# Patient Record
Sex: Male | Born: 1955 | Race: White | Hispanic: No | Smoking: Never smoker
Health system: Southern US, Community
[De-identification: ages and names within clinical notes are randomized; demographics above are authoritative.]

## PROBLEM LIST (undated history)

## (undated) DIAGNOSIS — M1990 Unspecified osteoarthritis, unspecified site: Secondary | ICD-10-CM

## (undated) DIAGNOSIS — I219 Acute myocardial infarction, unspecified: Secondary | ICD-10-CM

## (undated) DIAGNOSIS — Z9289 Personal history of other medical treatment: Secondary | ICD-10-CM

## (undated) DIAGNOSIS — J189 Pneumonia, unspecified organism: Secondary | ICD-10-CM

## (undated) DIAGNOSIS — F1010 Alcohol abuse, uncomplicated: Secondary | ICD-10-CM

## (undated) DIAGNOSIS — I2510 Atherosclerotic heart disease of native coronary artery without angina pectoris: Secondary | ICD-10-CM

## (undated) DIAGNOSIS — I10 Essential (primary) hypertension: Secondary | ICD-10-CM

## (undated) HISTORY — PX: CARDIAC CATHETERIZATION: SHX172

## (undated) HISTORY — PX: HERNIA REPAIR: SHX51

## (undated) HISTORY — DX: Personal history of other medical treatment: Z92.89

## (undated) HISTORY — DX: Essential (primary) hypertension: I10

## (undated) HISTORY — DX: Atherosclerotic heart disease of native coronary artery without angina pectoris: I25.10

---

## 2013-06-29 ENCOUNTER — Emergency Department: Payer: Self-pay | Admitting: Emergency Medicine

## 2013-06-29 LAB — CBC WITH DIFFERENTIAL/PLATELET
Basophil #: 0.1 10*3/uL (ref 0.0–0.1)
Basophil %: 0.8 %
Eosinophil #: 0.1 10*3/uL (ref 0.0–0.7)
Eosinophil %: 1.3 %
HCT: 36.7 % — ABNORMAL LOW (ref 40.0–52.0)
HGB: 12.6 g/dL — ABNORMAL LOW (ref 13.0–18.0)
Lymphocyte #: 0.5 10*3/uL — ABNORMAL LOW (ref 1.0–3.6)
Lymphocyte %: 7.4 %
MCH: 33 pg (ref 26.0–34.0)
MCHC: 34.2 g/dL (ref 32.0–36.0)
MCV: 96 fL (ref 80–100)
MONO ABS: 0.6 x10 3/mm (ref 0.2–1.0)
MONOS PCT: 7.6 %
Neutrophil #: 6 10*3/uL (ref 1.4–6.5)
Neutrophil %: 82.9 %
Platelet: 238 10*3/uL (ref 150–440)
RBC: 3.81 10*6/uL — ABNORMAL LOW (ref 4.40–5.90)
RDW: 13.2 % (ref 11.5–14.5)
WBC: 7.3 10*3/uL (ref 3.8–10.6)

## 2013-06-29 LAB — BASIC METABOLIC PANEL
Anion Gap: 11 (ref 7–16)
BUN: 10 mg/dL (ref 7–18)
CHLORIDE: 104 mmol/L (ref 98–107)
CO2: 23 mmol/L (ref 21–32)
Calcium, Total: 9 mg/dL (ref 8.5–10.1)
Creatinine: 0.76 mg/dL (ref 0.60–1.30)
EGFR (African American): >60
Glucose: 81 mg/dL (ref 65–99)
OSMOLALITY: 274 (ref 275–301)
Potassium: 3.4 mmol/L — ABNORMAL LOW (ref 3.5–5.1)
Sodium: 138 mmol/L (ref 136–145)

## 2013-06-29 LAB — TROPONIN I

## 2014-03-31 ENCOUNTER — Emergency Department: Payer: Self-pay | Admitting: Emergency Medicine

## 2014-05-11 NOTE — Consult Note (Signed)
PATIENT NAME:  Jason Wiggins, Jason Wiggins MR#:  [id number] DATE OF BIRTH:  September 28, 1955  DATE OF CONSULTATION:  04/01/2014  REFERRING PHYSICIAN:   CONSULTING PHYSICIAN:  Audery Amel, MD   IDENTIFYING INFORMATION AND REASON FOR CONSULTATION: A 59 year old man who is brought in by his sister with concern that he had made suicidal statements yesterday.   CHIEF COMPLAINT: "I had a bad day."   HISTORY OF PRESENT ILLNESS: Information from the patient and the chart. The patient's paperwork states that he had made suicidal threats talking about shooting himself with a gun and that the sister was concerned because there was a gun available in the house. The patient says that he was upset yesterday and that what he recalls is that he made a statement about how he would be better off if he were not alive anymore, but he denies that he had actually threatened to kill himself. Yesterday, he had a major stress because his wife just got out of prison after 6 years. He has not been in any contact with her for 18 months. When she got out, she refused to speak to him on the phone. He overreacted to this and made statements about being better off dead. He admits that he had been drinking at the time. Denied that he had been abusing any drugs. He tells me that prior to yesterday his mood had been fine. Was not having ongoing depression. Slept and ate normally. Had not been having any suicidal thoughts. The patient drinks about three 24 ounces size beers every day which has been a chronic pattern. Not using any other drugs. Not getting any psychiatric treatment.   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 He has never seen a psychiatrist for any other kind of treatment. Denies any history of psychiatric hospitalization or medicine. Denies any past history of suicide attempts.   SUBSTANCE ABUSE HISTORY: History of heroin abuse which ended 6 years ago when he was locked up  for drug-related charges. He says he got out about 3 years ago and did not go back to using narcotics after that. Continues to drink. Says drinking has been a problem for him in the past. No history of seizures or DTs.   SOCIAL HISTORY: The patient resides with his sister. He has 2 sisters living here in [place]. 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 He is married and his wife has been incarcerated for 6 years as well and just got out yesterday, but is declining to speak to him.   PAST MEDICAL HISTORY: Multiple orthopedic injuries over the years resulting in chronic arthritis. Has arthritis with pain in his hands, his shoulder, and knees. Does not get any kind of treatment for it. No other known medical problems.   FAMILY HISTORY: No known history of mental illness.   CURRENT MEDICATIONS: None.   ALLERGIES: No known drug allergies.   REVIEW OF SYSTEMS: The patient denies depression. Denies suicidal ideation. Denies hallucinations. Mild arthritis right is mostly in the hands. No other physical complaints.   MENTAL STATUS EXAMINATION: Adequately groomed gentleman, looks his stated age, cooperative with the interview. Eye contact good. Psychomotor activity normal. Speech quiet, but normal tone. Affect is slightly flattened, but not extreme. Does not appear hopeless at all.  Able to smile. Mood is stated as all right. Thoughts are lucid without loosening of associations. No evidence of delusions. Denies auditory or visual hallucinations. Denies  suicidal or homicidal ideation. He is alert and oriented x 4. Remembers 3/3 objects immediately, 2/3 at 3 minutes. Judgment and insight appears to be good.  Baseline intelligence appears to be normal.   LABORATORY RESULTS: Alcohol level 222 on admission. Drug screen was all negative. Chemistry panel was all negative. CBC: Slightly low hematocrit at 39.6. Urinalysis unremarkable.   VITAL SIGNS: Blood pressure 139/72, respirations 18, pulse 91,  temperature 97.2.   ASSESSMENT: This is a 59 year old man who had a very bad piece of news yesterday. Had also been drinking at the time. Made suicidal statements, but evidently did not act on it. Today he is denying depression or depressive symptoms. Has no psychotic symptoms. Totally denies suicidal ideation. Has good insight. The patient's diagnosis will be made, adjustment disorder with disturbance of mood, also alcohol abuse mild to moderate. The patient does not appear to meet commitment criteria.   TREATMENT PLAN: Discontinue involuntary commitment. Case discussed with Dr. Carollee MassedKaminski. Recommend he be discharged from the Emergency Room. Psychoeducation completed about the dangers of drinking especially along with mood problems. The patient expresses an understanding of this. He is encouraged to follow up with [facility] for counseling services. He can also be given information about the [facility] for further medical care.   DIAGNOSIS, PRINCIPAL AND PRIMARY: AXIS I: Adjustment disorder with depressed mood.   SECONDARY DIAGNOSES:  AXIS I: Alcohol abuse, moderate.  AXIS II: Deferred.  AXIS III: Arthritis.   ____________________________ Audery AmelJohn T. Clapacs, MD jtc:sp D: 04/01/2014 11:13:45 ET T: 04/01/2014 11:44:03 ET JOB#: [id number]  cc: Audery AmelJohn T. Clapacs, MD, <Dictator> Audery AmelJOHN T CLAPACS MD ELECTRONICALLY SIGNED 04/14/2014 9:57

## 2014-08-29 ENCOUNTER — Emergency Department
Admission: EM | Admit: 2014-08-29 | Discharge: 2014-08-29 | Disposition: A | Discharge status: Home / Self Care | Payer: Self-pay | Attending: Emergency Medicine | Admitting: Emergency Medicine

## 2014-08-29 ENCOUNTER — Encounter: Payer: Self-pay | Admitting: Emergency Medicine

## 2014-08-29 ENCOUNTER — Emergency Department: Payer: Self-pay

## 2014-08-29 DIAGNOSIS — M25512 Pain in left shoulder: Secondary | ICD-10-CM | POA: Insufficient documentation

## 2014-08-29 MED ORDER — NAPROXEN 500 MG PO TABS: 500.0000 mg | ORAL_TABLET | Freq: Two times a day (BID) | ORAL | Status: DC

## 2014-08-29 NOTE — ED Notes (Signed)
Pt presents with left shoulder pain started yesterday. Denies any shortness of breath and or chest pain.

## 2014-08-29 NOTE — ED Notes (Signed)
Patient ambulated with easy and steady gait to X-Ray.  NAD.  Upright and relaxed posture.

## 2014-08-29 NOTE — ED Provider Notes (Signed)
[facility] Emergency Department Provider Note  ____________________________________________  Time seen: Approximately 11:14 AM  I have reviewed the triage vital signs and the nursing notes.   HISTORY  Chief Complaint Shoulder Pain   HPI Jason Que. is a 59 y.o. male is here complaining of left shoulder pain that started yesterday. He states he was carrying some things yesterday and his shoulder began hurting pain is increased today. He denies any direct trauma to his shoulder yesterday. He states that he did have an injury 10 years ago but never had it x-rayed. He has not taken any over-the-counter medication for his shoulder today. He denies any head injury or loss of consciousness during this accident.currently he rates his pain a 10 out of 10.he denies any previous history of hypertension. He states that one of his siblings is hypertensive. Currently he denies any shortness of breath or chest pain. He denies any cardiac disease.He states he has wife just moved to [place].   History reviewed. No pertinent past medical history.  There are no active problems to display for this patient.   History reviewed. No pertinent past surgical history.  Current Outpatient Rx  Name  Route  Sig  Dispense  Refill  . naproxen (NAPROSYN) 500 MG tablet   Oral   Take 1 tablet (500 mg total) by mouth 2 (two) times daily with a meal.   30 tablet   0     Allergies Review of patient's allergies indicates no known allergies.  No family history on file.  Social History Social History  Substance Use Topics  . Smoking status: Never Smoker   . Smokeless tobacco: None  . Alcohol Use: Yes    Review of Systems Constitutional: No fever/chills Eyes: No visual changes. ENT: No sore throat. Cardiovascular: Denies chest pain. Respiratory: Denies shortness of breath. Gastrointestinal: No abdominal pain.  No nausea, no vomiting. Musculoskeletal: Negative for back  pain. Skin: Negative for rash. Neurological: Negative for headaches, focal weakness or numbness.  10-point ROS otherwise negative.  ____________________________________________   PHYSICAL EXAM:  VITAL SIGNS: ED Triage Vitals  Enc Vitals Group     BP 08/29/14 1058 144/101 mmHg     Pulse Rate 08/29/14 1058 75     Resp 08/29/14 1058 18     Temp 08/29/14 1058 98.3 F (36.8 C)     Temp Source 08/29/14 1058 Oral     SpO2 08/29/14 1058 98 %     Weight 08/29/14 1058 280 lb (127.007 kg)     Height 08/29/14 1058 5\' 11"  (1.803 m)     Head Cir --      Peak Flow --      Pain Score 08/29/14 1059 10     Pain Loc --      Pain Edu? --      Excl. in GC? --     Constitutional: Alert and oriented. Well appearing and in no acute distress. Eyes: Conjunctivae are normal. PERRL. EOMI. Head: Atraumatic. Nose: No congestion/rhinnorhea. Neck: No stridor.  No cervical tenderness on palpation posteriorly Cardiovascular: Normal rate, regular rhythm. Grossly normal heart sounds.  Good peripheral circulation. Respiratory: Normal respiratory effort.  No retractions. Lungs CTAB. Gastrointestinal: Soft and nontender. No distention.  Musculoskeletal:left shoulder no gross deformity. There is minimal tenderness on palpation of the clavicle. Range of motion is restricted secondary to pain. Patient is able to abduct with limitation and posterior movement of his shoulder increases pain. No lower extremity tenderness nor edema.  No joint effusions. Neurologic:  Normal speech and language. No gross focal neurologic deficits are appreciated. No gait instability. Skin:  Skin is warm, dry and intact. No rash noted.there is no ecchymosis or abrasion noted Psychiatric: Mood and affect are normal. Speech and behavior are normal.  ____________________________________________   LABS (all labs ordered are listed, but only abnormal results are displayed)  Labs Reviewed - No data to display RADIOLOGY  Left shoulder  x-ray per radiologist shows degenerative changes but no acute bony abnormality. I, Tommi Rumps, personally viewed and evaluated these images (plain radiographs) as part of my medical decision making.  ____________________________________________   PROCEDURES  Procedure(s) performed: None  Critical Care performed: No  ____________________________________________   INITIAL IMPRESSION / ASSESSMENT AND PLAN / ED COURSE  Pertinent labs & imaging results that were available during my care of the patient were reviewed by me and considered in my medical decision making (see chart for details).  Patient was given a prescription for naproxen 500 mg 1 twice a day with food. He is to follow-up with [facility] if any continued problems.he is also encouraged to follow-up with [facility] health Department for hepatitis testing ____________________________________________   FINAL CLINICAL IMPRESSION(S) / ED DIAGNOSES  Final diagnoses:  Shoulder pain, acute, left      Tommi Rumps, PA-C 08/29/14 1324  Sharyn Creamer, MD 08/29/14 1616

## 2014-08-29 NOTE — ED Notes (Signed)
Patient states that wife just came to [facility] and is positive for hepatitis B and C and states he would like to be tested for hepatitis.

## 2014-08-29 NOTE — Discharge Instructions (Signed)
Arthritis, Nonspecific °Arthritis is inflammation of a joint. This usually means pain, redness, warmth or swelling are present. One or more joints may be involved. There are a number of types of arthritis. Your caregiver may not be able to tell what type of arthritis you have right away. °CAUSES  °The most common cause of arthritis is the wear and tear on the joint (osteoarthritis). This causes damage to the cartilage, which can break down over time. The knees, hips, back and neck are most often affected by this type of arthritis. °Other types of arthritis and common causes of joint pain include: °· Sprains and other injuries near the joint. Sometimes minor sprains and injuries cause pain and swelling that develop hours later. °· Rheumatoid arthritis. This affects hands, feet and knees. It usually affects both sides of your body at the same time. It is often associated with chronic ailments, fever, weight loss and general weakness. °· Crystal arthritis. Gout and pseudo gout can cause occasional acute severe pain, redness and swelling in the foot, ankle, or knee. °· Infectious arthritis. Bacteria can get into a joint through a break in overlying skin. This can cause infection of the joint. Bacteria and viruses can also spread through the blood and affect your joints. °· Drug, infectious and allergy reactions. Sometimes joints can become mildly painful and slightly swollen with these types of illnesses. °SYMPTOMS  °· Pain is the main symptom. °· Your joint or joints can also be red, swollen and warm or hot to the touch. °· You may have a fever with certain types of arthritis, or even feel overall ill. °· The joint with arthritis will hurt with movement. Stiffness is present with some types of arthritis. °DIAGNOSIS  °Your caregiver will suspect arthritis based on your description of your symptoms and on your exam. Testing may be needed to find the type of arthritis: °· Blood and sometimes urine tests. °· X-ray tests  and sometimes CT or MRI scans. °· Removal of fluid from the joint (arthrocentesis) is done to check for bacteria, crystals or other causes. Your caregiver (or a specialist) will numb the area over the joint with a local anesthetic, and use a needle to remove joint fluid for examination. This procedure is only minimally uncomfortable. °· Even with these tests, your caregiver may not be able to tell what kind of arthritis you have. Consultation with a specialist (rheumatologist) may be helpful. °TREATMENT  °Your caregiver will discuss with you treatment specific to your type of arthritis. If the specific type cannot be determined, then the following general recommendations may apply. °Treatment of severe joint pain includes: °· Rest. °· Elevation. °· Anti-inflammatory medication (for example, ibuprofen) may be prescribed. Avoiding activities that cause increased pain. °· Only take over-the-counter or prescription medicines for pain and discomfort as recommended by your caregiver. °· Cold packs over an inflamed joint may be used for 10 to 15 minutes every hour. Hot packs sometimes feel better, but do not use overnight. Do not use hot packs if you are diabetic without your caregiver's permission. °· A cortisone shot into arthritic joints may help reduce pain and swelling. °· Any acute arthritis that gets worse over the next 1 to 2 days needs to be looked at to be sure there is no joint infection. °Long-term arthritis treatment involves modifying activities and lifestyle to reduce joint stress jarring. This can include weight loss. Also, exercise is needed to nourish the joint cartilage and remove waste. This helps keep the muscles   around the joint strong. °HOME CARE INSTRUCTIONS  °· Do not take aspirin to relieve pain if gout is suspected. This elevates uric acid levels. °· Only take over-the-counter or prescription medicines for pain, discomfort or fever as directed by your caregiver. °· Rest the joint as much as  possible. °· If your joint is swollen, keep it elevated. °· Use crutches if the painful joint is in your leg. °· Drinking plenty of fluids may help for certain types of arthritis. °· Follow your caregiver's dietary instructions. °· Try low-impact exercise such as: °¨ Swimming. °¨ Water aerobics. °¨ Biking. °¨ Walking. °· Morning stiffness is often relieved by a warm shower. °· Put your joints through regular range-of-motion. °SEEK MEDICAL CARE IF:  °· You do not feel better in 24 hours or are getting worse. °· You have side effects to medications, or are not getting better with treatment. °SEEK IMMEDIATE MEDICAL CARE IF:  °· You have a fever. °· You develop severe joint pain, swelling or redness. °· Many joints are involved and become painful and swollen. °· There is severe back pain and/or leg weakness. °· You have loss of bowel or bladder control. °Document Released: 02/04/2004 Document Revised: 03/21/2011 Document Reviewed: 02/20/2008 °Exit[employer]® Patient Information ©2015 [employer]. This information is not intended to replace advice given to you by your health care provider. Make sure you discuss any questions you have with your health care provider. ° °

## 2016-07-20 ENCOUNTER — Emergency Department
Admission: EM | Admit: 2016-07-20 | Discharge: 2016-07-20 | Disposition: A | Discharge status: Home / Self Care | Payer: Self-pay | Attending: Student in an Organized Health Care Education/Training Program | Admitting: Student in an Organized Health Care Education/Training Program

## 2016-07-20 ENCOUNTER — Emergency Department: Payer: Self-pay

## 2016-07-20 ENCOUNTER — Encounter: Payer: Self-pay | Admitting: Emergency Medicine

## 2016-07-20 DIAGNOSIS — X500XXA Overexertion from strenuous movement or load, initial encounter: Secondary | ICD-10-CM | POA: Insufficient documentation

## 2016-07-20 DIAGNOSIS — S39012A Strain of muscle, fascia and tendon of lower back, initial encounter: Secondary | ICD-10-CM | POA: Insufficient documentation

## 2016-07-20 DIAGNOSIS — M545 Low back pain, unspecified: Secondary | ICD-10-CM

## 2016-07-20 DIAGNOSIS — Y929 Unspecified place or not applicable: Secondary | ICD-10-CM | POA: Insufficient documentation

## 2016-07-20 DIAGNOSIS — Y999 Unspecified external cause status: Secondary | ICD-10-CM | POA: Insufficient documentation

## 2016-07-20 DIAGNOSIS — Y93H9 Activity, other involving exterior property and land maintenance, building and construction: Secondary | ICD-10-CM | POA: Insufficient documentation

## 2016-07-20 HISTORY — DX: Unspecified osteoarthritis, unspecified site: M19.90

## 2016-07-20 LAB — URINALYSIS, COMPLETE (UACMP) WITH MICROSCOPIC
BACTERIA UA: NONE SEEN
Bilirubin Urine: NEGATIVE
Glucose, UA: NEGATIVE mg/dL
Hgb urine dipstick: NEGATIVE
Ketones, ur: NEGATIVE mg/dL
Leukocytes, UA: NEGATIVE
Nitrite: NEGATIVE
PH: 5 (ref 5.0–8.0)
Protein, ur: NEGATIVE mg/dL
RBC / HPF: NONE SEEN RBC/hpf (ref 0–5)
SPECIFIC GRAVITY, URINE: 1.019 (ref 1.005–1.030)

## 2016-07-20 MED ORDER — NAPROXEN 500 MG PO TABS: 500.0000 mg | ORAL_TABLET | Freq: Two times a day (BID) | ORAL | 0 refills | Status: DC

## 2016-07-20 MED ORDER — OXYCODONE-ACETAMINOPHEN 5-325 MG PO TABS
1.0000 | ORAL_TABLET | Freq: Once | ORAL | Status: AC
  Administered 2016-07-20: 1 via ORAL
  Filled 2016-07-20: qty 1

## 2016-07-20 MED ORDER — DIAZEPAM 2 MG PO TABS: 2.0000 mg | ORAL_TABLET | Freq: Three times a day (TID) | ORAL | 0 refills | Status: DC | PRN

## 2016-07-20 MED ORDER — HYDROCODONE-ACETAMINOPHEN 5-325 MG PO TABS: 1.0000 | ORAL_TABLET | ORAL | 0 refills | Status: DC | PRN

## 2016-07-20 MED ORDER — KETOROLAC TROMETHAMINE 30 MG/ML IJ SOLN
30.0000 mg | Freq: Once | INTRAMUSCULAR | Status: AC
  Administered 2016-07-20: 30 mg via INTRAMUSCULAR
  Filled 2016-07-20: qty 1

## 2016-07-20 MED ORDER — DIAZEPAM 5 MG PO TABS
5.0000 mg | ORAL_TABLET | Freq: Once | ORAL | Status: AC
  Administered 2016-07-20: 5 mg via ORAL
  Filled 2016-07-20: qty 1

## 2016-07-20 NOTE — Discharge Instructions (Signed)
Follow-up with your primary care doctor or [facility] if any continued problems. Use ice or heat to back. Take medication only as directed. Diazepam as needed for muscle spasm, and naproxen 500 mg twice a day every day with food and Norco every 4 hours if needed for moderate pain.

## 2016-07-20 NOTE — ED Provider Notes (Signed)
[facility] Emergency Department Provider Note  ____________________________________________   First MD Initiated Contact with Patient 07/20/16 1039     (approximate)  I have reviewed the triage vital signs and the nursing notes.   HISTORY  Chief Complaint Back Pain    HPI Jason CoveFoster Alfonzo Jr. is a 61 y.o. male is here complaining of left lower back pain. Patient states that pain began while trying to take a tree down 2 days ago. He also had back pain 2 weeks ago when he believes he was passing a kidney stone.Patient denies any nausea or vomiting. There is been no fever or chills. He denies any hematuria at this time. He is taken over-the-counter medication without any relief of his back pain. He denies any paresthesias, incontinence of bowel or bladder, or saddle anesthesias. Patient continues to ambulate slowly with pain. Currently he rates his pain as a 10 over 10.   Past Medical History:  Diagnosis Date  . Arthritis     There are no active problems to display for this patient.   Past Surgical History:  Procedure Laterality Date  . HERNIA REPAIR      Prior to Admission medications   Medication Sig Start Date End Date Taking? Authorizing Provider  diazepam (VALIUM) 2 MG tablet Take 1 tablet (2 mg total) by mouth every 8 (eight) hours as needed for muscle spasms. 07/20/16   Tommi RumpsSummers, Tyffany Waldrop L, PA-C  HYDROcodone-acetaminophen (NORCO/VICODIN) 5-325 MG tablet Take 1 tablet by mouth every 4 (four) hours as needed for moderate pain. 07/20/16   Tommi RumpsSummers, Koby Pickup L, PA-C  naproxen (NAPROSYN) 500 MG tablet Take 1 tablet (500 mg total) by mouth 2 (two) times daily with a meal. 07/20/16   Tommi RumpsSummers, Idalia Allbritton L, PA-C    Allergies Patient has no known allergies.  No family history on file.  Social History Social History  Substance Use Topics  . Smoking status: Never Smoker  . Smokeless tobacco: Never Used  . Alcohol use Yes     Comment: daily    Review of  Systems Constitutional: No fever/chills Cardiovascular: Denies chest pain. Respiratory: Denies shortness of breath. Gastrointestinal: No abdominal pain.  No nausea, no vomiting.   Genitourinary: Negative for dysuria. Musculoskeletal: Positive for left-sided back pain. Skin: Negative for rash. Neurological: Negative for headaches, focal weakness or numbness.   ____________________________________________   PHYSICAL EXAM:  VITAL SIGNS: ED Triage Vitals  Enc Vitals Group     BP 07/20/16 0953 (!) 146/81     Pulse Rate 07/20/16 0953 77     Resp 07/20/16 0953 18     Temp 07/20/16 0953 98.3 F (36.8 C)     Temp Source 07/20/16 0953 Oral     SpO2 07/20/16 0953 95 %     Weight 07/20/16 0945 240 lb (108.9 kg)     Height 07/20/16 0945 5\' 9"  (1.753 m)     Head Circumference --      Peak Flow --      Pain Score 07/20/16 0944 10     Pain Loc --      Pain Edu? --      Excl. in GC? --    Constitutional: Alert and oriented. Well appearing and in no acute distress. Eyes: Conjunctivae are normal. PERRL. EOMI. Neck: No stridor.   Cardiovascular: Normal rate, regular rhythm. Grossly normal heart sounds.  Good peripheral circulation. Respiratory: Normal respiratory effort.  No retractions. Lungs CTAB. Gastrointestinal: Soft and nontender. No distention.  No CVA tenderness.  Musculoskeletal: On examination of the lower back there is no gross deformity noted. There is moderate tenderness on palpation of the L5-S1 area and paravertebral muscles bilaterally. Range of motion is slow and guarded secondary to pain. Straight leg raises are negative. Slow guarded gait was noted. Neurologic:  Normal speech and language. No gross focal neurologic deficits are appreciated. Reflexes 1+ bilaterally. Skin:  Skin is warm, dry and intact. No rash noted. Psychiatric: Mood and affect are normal. Speech and behavior are normal.  ____________________________________________   LABS (all labs ordered are listed,  but only abnormal results are displayed)  Labs Reviewed  URINALYSIS, COMPLETE (UACMP) WITH MICROSCOPIC - Abnormal; Notable for the following:       Result Value   Color, Urine YELLOW (*)    APPearance CLEAR (*)    Squamous Epithelial / LPF 0-5 (*)    All other components within normal limits   ____________________________________________  RADIOLOGY  Dg Lumbar Spine 2-3 Views  Result Date: 07/20/2016 CLINICAL DATA:  Back pain.  Injury cutting down tree EXAM: LUMBAR SPINE - 2-3 VIEW COMPARISON:  None. FINDINGS: Mild levoscoliosis. Normal alignment. Mild disc degeneration and spurring throughout the lumbar spine. Negative for fracture. Mild facet degeneration L5-S1. IMPRESSION: Lumbar degenerative changes.  Negative for fracture. Electronically Signed   By: Marlan Palau M.D.   On: 07/20/2016 12:14    ____________________________________________   PROCEDURES  Procedure(s) performed: None  Procedures  Critical Care performed: No  ____________________________________________   INITIAL IMPRESSION / ASSESSMENT AND PLAN / ED COURSE  Pertinent labs & imaging results that were available during my care of the patient were reviewed by me and considered in my medical decision making (see chart for details).  Patient was given Toradol 30 mg IM, Percocet 5 mg by mouth and diazepam 5 mg by mouth. Patient did have improvement prior to his discharge. We discussed the findings on his x-ray with degenerative changes. He was discharged with prescription for naproxen 500 mg twice a day with food, diazepam 2 mg 1 tablet every 8 hours as needed for muscle spasms for the next 3 days and Norco every hours if needed for pain. He is to follow-up with his PCP or [facility] if any continued problems.      ____________________________________________   FINAL CLINICAL IMPRESSION(S) / ED DIAGNOSES  Final diagnoses:  Strain of lumbar region, initial encounter  Acute left-sided low back pain  without sciatica      NEW MEDICATIONS STARTED DURING THIS VISIT:  New Prescriptions   DIAZEPAM (VALIUM) 2 MG TABLET    Take 1 tablet (2 mg total) by mouth every 8 (eight) hours as needed for muscle spasms.   HYDROCODONE-ACETAMINOPHEN (NORCO/VICODIN) 5-325 MG TABLET    Take 1 tablet by mouth every 4 (four) hours as needed for moderate pain.   NAPROXEN (NAPROSYN) 500 MG TABLET    Take 1 tablet (500 mg total) by mouth 2 (two) times daily with a meal.     Note:  This document was prepared using Dragon voice recognition software and may include unintentional dictation errors.    Tommi Rumps, PA-C 07/20/16 1313    Willy Eddy, MD 07/20/16 1345

## 2016-07-20 NOTE — ED Triage Notes (Signed)
Patient presents to the ED with lower back pain x 2 days after twisting when attempting to cut down a tree.  Patient ambulatory to triage.

## 2016-07-20 NOTE — ED Notes (Signed)
Pt using chainsaw X 2 days ago, pulled it hard and developed left lower back pain.

## 2016-07-30 ENCOUNTER — Encounter: Payer: Self-pay | Admitting: Emergency Medicine

## 2016-07-30 ENCOUNTER — Emergency Department
Admission: EM | Admit: 2016-07-30 | Discharge: 2016-07-30 | Disposition: A | Discharge status: Home / Self Care | Payer: Self-pay | Attending: Emergency Medicine | Admitting: Emergency Medicine

## 2016-07-30 DIAGNOSIS — W298XXA Contact with other powered powered hand tools and household machinery, initial encounter: Secondary | ICD-10-CM | POA: Insufficient documentation

## 2016-07-30 DIAGNOSIS — Y929 Unspecified place or not applicable: Secondary | ICD-10-CM | POA: Insufficient documentation

## 2016-07-30 DIAGNOSIS — Y999 Unspecified external cause status: Secondary | ICD-10-CM | POA: Insufficient documentation

## 2016-07-30 DIAGNOSIS — S3121XA Laceration without foreign body of penis, initial encounter: Secondary | ICD-10-CM | POA: Insufficient documentation

## 2016-07-30 DIAGNOSIS — Y9389 Activity, other specified: Secondary | ICD-10-CM | POA: Insufficient documentation

## 2016-07-30 LAB — URINALYSIS, COMPLETE (UACMP) WITH MICROSCOPIC
BACTERIA UA: NONE SEEN
Bilirubin Urine: NEGATIVE
GLUCOSE, UA: NEGATIVE mg/dL
HGB URINE DIPSTICK: NEGATIVE
Ketones, ur: 5 mg/dL — AB
LEUKOCYTES UA: NEGATIVE
NITRITE: NEGATIVE
PROTEIN: NEGATIVE mg/dL
Specific Gravity, Urine: 1.026 (ref 1.005–1.030)
Squamous Epithelial / LPF: NONE SEEN
pH: 5 (ref 5.0–8.0)

## 2016-07-30 MED ORDER — LIDOCAINE HCL (PF) 1 % IJ SOLN
5.0000 mL | Freq: Once | INTRAMUSCULAR | Status: AC
  Administered 2016-07-30: 5 mL
  Filled 2016-07-30: qty 5

## 2016-07-30 MED ORDER — LIDOCAINE-EPINEPHRINE-TETRACAINE (LET) SOLUTION
3.0000 mL | Freq: Once | NASAL | Status: AC
  Administered 2016-07-30: 20:00:00 3 mL via TOPICAL
  Filled 2016-07-30: qty 3

## 2016-07-30 MED ORDER — OXYCODONE-ACETAMINOPHEN 5-325 MG PO TABS
1.0000 | ORAL_TABLET | Freq: Once | ORAL | Status: AC
Start: 2016-07-30 — End: 2016-07-30
  Administered 2016-07-30: 1 via ORAL
  Filled 2016-07-30: qty 1

## 2016-07-30 NOTE — ED Provider Notes (Signed)
[facility] Department [facility]   ____________________________________________   I have reviewed the triage vital signs and the nursing notes.   HISTORY  Chief Complaint Laceration    HPI Jason CoveFoster Mapps Jr. is a 61 y.o. male presents to the emergency department with a laceration along the base of the penis and scrotum he sustained earlier tonight. Patient reports while grinding a piece of metal, the person assisting him losing grip on the metal. The metal grinder kicked back and the grinder caught his jeans tearing into the jeans and his underwear then sustaining a laceration. Patient reported having TDAP vaccination 3 years ago. Patient easily maintained hemorrhage control. Patient denies fever, chills, headache, vision changes, chest pain, chest tightness, shortness of breath, abdominal pain, nausea and vomiting.  Past Medical History:  Diagnosis Date  . Arthritis     There are no active problems to display for this patient.   Past Surgical History:  Procedure Laterality Date  . HERNIA REPAIR      Prior to Admission medications   Medication Sig Start Date End Date Taking? Authorizing Provider  diazepam (VALIUM) 2 MG tablet Take 1 tablet (2 mg total) by mouth every 8 (eight) hours as needed for muscle spasms. 07/20/16   Tommi RumpsSummers, Rhonda L, PA-C  HYDROcodone-acetaminophen (NORCO/VICODIN) 5-325 MG tablet Take 1 tablet by mouth every 4 (four) hours as needed for moderate pain. 07/20/16   Tommi RumpsSummers, Rhonda L, PA-C  naproxen (NAPROSYN) 500 MG tablet Take 1 tablet (500 mg total) by mouth 2 (two) times daily with a meal. 07/20/16   Tommi RumpsSummers, Rhonda L, PA-C    Allergies Patient has no known allergies.  No family history on file.  Social History Social History  Substance Use Topics  . Smoking status: Never Smoker  . Smokeless tobacco: Never Used  . Alcohol use Yes     Comment: daily    Review of Systems Constitutional: Negative for  fever/chills Eyes: No visual changes. Cardiovascular: Denies chest pain. Respiratory: Denies cough. Denies shortness of breath. Gastrointestinal: No abdominal pain.  No nausea, vomiting, diarrhea. Genitourinary: Pain at the base of the penis and right scrotum secondary to laceration Musculoskeletal: Negative for back pain. Skin: Negative for rash. 4 cm laceration at the base of the penis full-thickness skin. 1 cm laceration along the right scrotum. Neurological: Negative for headaches.  Negative focal weakness or numbness. Negative for loss of consciousness. Able to ambulate. ____________________________________________   PHYSICAL EXAM:  VITAL SIGNS: ED Triage Vitals [07/30/16 1745]  Enc Vitals Group     BP (!) 146/98     Pulse Rate 83     Resp 20     Temp 97.7 F (36.5 C)     Temp Source Oral     SpO2 94 %     Weight 240 lb (108.9 kg)     Height 5\' 9"  (1.753 m)     Head Circumference      Peak Flow      Pain Score 10     Pain Loc      Pain Edu?      Excl. in GC?     Constitutional: Alert and oriented. Well appearing and in no acute distress.  Head: Normocephalic and atraumatic. Eyes: Conjunctivae are normal. PERRL. Cardiovascular: Normal rate, regular rhythm. Normal distal pulses. Respiratory: Normal respiratory effort.  Gastrointestinal: Soft and nontender. No distention. Genitourinary: Pain at the base of the penis and right scrotum secondary to laceration. Laceration through the epidermis  and dermal layers without involvement of the deeper soft tissue or neurovascular structures. Musculoskeletal: Nontender with normal range of motion in all extremities. Neurologic: Normal speech and language. Skin:  Skin is warm, dry and intact. No rash noted. 4.0 cm laceration along the base of the penis, full skin thickness, through epidermis and dermis. 1 cm laceration to the right scrotal sac. Psychiatric: Mood and affect are normal.  ____________________________________________    LABS (all labs ordered are listed, but only abnormal results are displayed)  Labs Reviewed  URINALYSIS, COMPLETE (UACMP) WITH MICROSCOPIC - Abnormal; Notable for the following:       Result Value   Color, Urine YELLOW (*)    APPearance CLEAR (*)    Ketones, ur 5 (*)    All other components within normal limits   ____________________________________________  EKG None ____________________________________________  RADIOLOGY None ____________________________________________   PROCEDURES  Procedure(s) performed: LACERATION REPAIR Performed by: Clois Comber Authorized by: Clois Comber Consent: Verbal consent obtained. Risks and benefits: risks, benefits and alternatives were discussed Consent given by: patient Patient identity confirmed: provided demographic data Prepped and Draped in normal sterile fashion Wound explored  Laceration Location: Base of the penis and right scrotal sac  Laceration Length: 4.0 cm and 1.0 cm  No Foreign Bodies seen or palpated  Anesthesia: local infiltration  Local anesthetic: lidocaine 1.0 %  Anesthetic total: 5.0 ml  Irrigation method: syringe Amount of cleaning: standard  Skin closure: Ethilon 5-0  Number of sutures: 6 sutures at base of penis and 2 sutures right scrotal sac  Technique: Simple interrupted   Patient tolerance: Patient tolerated the procedure well with no immediate complications.    Critical Care performed: no ____________________________________________   INITIAL IMPRESSION / ASSESSMENT AND PLAN / ED COURSE  Pertinent labs & imaging results that were available during my care of the patient were reviewed by me and considered in my medical decision making (see chart for details).  Patient sustained a laceration along the base of the penis in the right scrotal sac  Assessment confirmed intact sensation and neurovascular sure before and after wound closure. Laceration required suture closure as noted  above. Patient tolerated procedure well. Pt instructed to keep wound clean and dry. Patient reported having a TDAP 3 years ago. Patient will return to the emergency department or PCP for suture removal in 5 days. Patient also instructed to watch for signs of infection and return if changes are noted. Patient informed of clinical course, understand medical decision-making process, and agree with plan.  Patient advised to return to the emergency department for symptoms that change or worsen.     ____________________________________________   FINAL CLINICAL IMPRESSION(S) / ED DIAGNOSES  Final diagnoses:  Laceration of penis without foreign body, initial encounter       NEW MEDICATIONS STARTED DURING THIS VISIT:  Discharge Medication List as of 07/30/2016  9:32 PM       Note:  This document was prepared using Dragon voice recognition software and may include unintentional dictation errors.    Shams Fill, Karl Pock 07/31/16 0020    Sharman Cheek, MD 07/31/16 2337

## 2016-07-30 NOTE — ED Notes (Signed)
Pt. States working with [employer] grinder with co-worker when metal moved and cut into patients pants.  Pt. Has a circular cut to base of penis about 1.5 inches long.  Pt. Also has a half inch cut to rt. testicle.  Bleeding controlled at this time.

## 2016-07-30 NOTE — Discharge Instructions (Signed)
Monitor wound area for signs of infection return to the emergency department if you note development of an infection. Return to primary care or emergency department for suture removal in 5 days.

## 2016-07-30 NOTE — ED Provider Notes (Signed)
Medical screening examination/treatment/procedure(s) were conducted as a shared visit with non-physician practitioner(s) and myself.  I personally evaluated the patient during the encounter.  I discussed the patient's care with PA Little.  Superficial laceration to base of penis and right scrotum. Tetanus up to date from 3 years ago.  - wound care, lac repair - ua to screen for hematuria - f/u urology this week   Sharman CheekStafford, Sumit Branham, MD 07/30/16 2033

## 2016-07-30 NOTE — ED Notes (Signed)
Pt. States he has not tried to urinate and has no urge to go at this time.  Redness noted to inside thighs of legs.  Pt. States the redness on legs started about 6 months ago.

## 2016-07-30 NOTE — ED Triage Notes (Signed)
Using grinder and slipped, noted R penile lac, bleeding stopped at this time.

## 2016-09-29 ENCOUNTER — Emergency Department: Payer: Self-pay

## 2016-09-29 ENCOUNTER — Emergency Department
Admission: EM | Admit: 2016-09-29 | Discharge: 2016-09-29 | Discharge status: Against Medical Advice | Payer: Self-pay | Attending: Student in an Organized Health Care Education/Training Program | Admitting: Student in an Organized Health Care Education/Training Program

## 2016-09-29 ENCOUNTER — Encounter: Payer: Self-pay | Admitting: Emergency Medicine

## 2016-09-29 DIAGNOSIS — Z5320 Procedure and treatment not carried out because of patient's decision for unspecified reasons: Secondary | ICD-10-CM | POA: Insufficient documentation

## 2016-09-29 DIAGNOSIS — R938 Abnormal findings on diagnostic imaging of other specified body structures: Secondary | ICD-10-CM | POA: Insufficient documentation

## 2016-09-29 DIAGNOSIS — R079 Chest pain, unspecified: Secondary | ICD-10-CM | POA: Insufficient documentation

## 2016-09-29 DIAGNOSIS — R9389 Abnormal findings on diagnostic imaging of other specified body structures: Secondary | ICD-10-CM

## 2016-09-29 LAB — PROTIME-INR
INR: 1.22
Prothrombin Time: 15.3 seconds — ABNORMAL HIGH (ref 11.4–15.2)

## 2016-09-29 LAB — CBC
HCT: 43.6 % (ref 40.0–52.0)
HEMOGLOBIN: 15.6 g/dL (ref 13.0–18.0)
MCH: 35.8 pg — AB (ref 26.0–34.0)
MCHC: 35.7 g/dL (ref 32.0–36.0)
MCV: 100.2 fL — ABNORMAL HIGH (ref 80.0–100.0)
Platelets: 196 10*3/uL (ref 150–440)
RBC: 4.35 MIL/uL — ABNORMAL LOW (ref 4.40–5.90)
RDW: 13.7 % (ref 11.5–14.5)
WBC: 8.6 10*3/uL (ref 3.8–10.6)

## 2016-09-29 LAB — BASIC METABOLIC PANEL
ANION GAP: 10 (ref 5–15)
BUN: 26 mg/dL — AB (ref 6–20)
CALCIUM: 9.3 mg/dL (ref 8.9–10.3)
CO2: 23 mmol/L (ref 22–32)
Chloride: 107 mmol/L (ref 101–111)
Creatinine, Ser: 1.15 mg/dL (ref 0.61–1.24)
GFR calc Af Amer: >60 mL/min (ref >60)
GFR calc non Af Amer: >60 mL/min (ref >60)
GLUCOSE: 115 mg/dL — AB (ref 65–99)
Potassium: 4.1 mmol/L (ref 3.5–5.1)
Sodium: 140 mmol/L (ref 135–145)

## 2016-09-29 LAB — TROPONIN I

## 2016-09-29 MED ORDER — FENTANYL CITRATE (PF) 100 MCG/2ML IJ SOLN
100.0000 ug | INTRAMUSCULAR | Status: DC | PRN
  Administered 2016-09-29: 100 ug via INTRAVENOUS
  Filled 2016-09-29: qty 2

## 2016-09-29 MED ORDER — ISOSORBIDE MONONITRATE ER 30 MG PO TB24: 30.0000 mg | ORAL_TABLET | Freq: Every day | ORAL | 11 refills | Status: DC

## 2016-09-29 MED ORDER — SODIUM CHLORIDE 0.9 % IV BOLUS (SEPSIS)
1000.0000 mL | Freq: Once | INTRAVENOUS | Status: AC
  Administered 2016-09-29: 1000 mL via INTRAVENOUS

## 2016-09-29 MED ORDER — ISOSORBIDE MONONITRATE ER 60 MG PO TB24
30.0000 mg | ORAL_TABLET | Freq: Every day | ORAL | Status: DC
  Administered 2016-09-29: 30 mg via ORAL
  Filled 2016-09-29: qty 1

## 2016-09-29 MED ORDER — IOPAMIDOL (ISOVUE-370) INJECTION 76%
100.0000 mL | Freq: Once | INTRAVENOUS | Status: AC | PRN
  Administered 2016-09-29: 100 mL via INTRAVENOUS

## 2016-09-29 MED ORDER — NITROGLYCERIN 2 % TD OINT
TOPICAL_OINTMENT | TRANSDERMAL | Status: AC
Start: 2016-09-29 — End: 2016-09-29
  Administered 2016-09-29: 1 [in_us]
  Filled 2016-09-29: qty 1

## 2016-09-29 MED ORDER — METOPROLOL TARTRATE 25 MG PO TABS: 25.0000 mg | ORAL_TABLET | Freq: Two times a day (BID) | ORAL | 11 refills | Status: DC

## 2016-09-29 MED ORDER — METOPROLOL TARTRATE 25 MG PO TABS
25.0000 mg | ORAL_TABLET | Freq: Once | ORAL | Status: AC
  Administered 2016-09-29: 25 mg via ORAL
  Filled 2016-09-29: qty 1

## 2016-09-29 MED ORDER — ASPIRIN EC 325 MG PO TBEC: 325.0000 mg | DELAYED_RELEASE_TABLET | Freq: Every day | ORAL | 3 refills | Status: AC

## 2016-09-29 NOTE — ED Notes (Signed)

## 2016-09-29 NOTE — ED Notes (Signed)
PT signed ED discharge on hard copy and placed on chart.

## 2016-09-29 NOTE — ED Notes (Signed)
ED Provider at bedside. 

## 2016-09-29 NOTE — ED Triage Notes (Signed)
Pt to ED via EMS from home with c/o CP x2wks but worse today. PT was given 324 asprin, 2 subling nitro, and fentanyl in route. Pt denies any pain relief. Pt A&Ox4, VS stable

## 2016-09-29 NOTE — ED Provider Notes (Addendum)
[facility] Emergency Department Provider Note    First MD Initiated Contact with Patient 09/29/16 1703     (approximate)  I have reviewed the triage vital signs and the nursing notes.   HISTORY  Chief Complaint Chest Pain    HPI Jason Wiggins. is a 61 y.o. male with no known past medical history presents with stuttering chest pains for the past 2 weeks that became acutely severe today while the patient was raking topsoil. States the pain is on the left anterior chest wall and radiates through to his back. States it is stabbing in nature. No change with position. Was given aspirin and fentanyl and nitroglycerin in route without any improvement. Still states that the pain is severe and 10 out of 10 in severity.   Past Medical History:  Diagnosis Date  . Arthritis    No family history on file. Past Surgical History:  Procedure Laterality Date  . HERNIA REPAIR     There are no active problems to display for this patient.     Prior to Admission medications   Medication Sig Start Date End Date Taking? Authorizing Provider  aspirin EC 325 MG tablet Take 1 tablet (325 mg total) by mouth daily. 09/29/16 09/29/17  Willy Eddy, MD  diazepam (VALIUM) 2 MG tablet Take 1 tablet (2 mg total) by mouth every 8 (eight) hours as needed for muscle spasms. Patient not taking: Reported on 09/29/2016 07/20/16   Tommi Rumps, PA-C  HYDROcodone-acetaminophen (NORCO/VICODIN) 5-325 MG tablet Take 1 tablet by mouth every 4 (four) hours as needed for moderate pain. Patient not taking: Reported on 09/29/2016 07/20/16   Tommi Rumps, PA-C  isosorbide mononitrate (IMDUR) 30 MG 24 hr tablet Take 1 tablet (30 mg total) by mouth daily. 09/29/16 09/29/17  Willy Eddy, MD  metoprolol tartrate (LOPRESSOR) 25 MG tablet Take 1 tablet (25 mg total) by mouth 2 (two) times daily. 09/29/16 09/29/17  Willy Eddy, MD  naproxen (NAPROSYN) 500 MG tablet Take 1 tablet (500 mg  total) by mouth 2 (two) times daily with a meal. Patient not taking: Reported on 09/29/2016 07/20/16   Tommi Rumps, PA-C    Allergies Patient has no known allergies.    Social History Social History  Substance Use Topics  . Smoking status: Never Smoker  . Smokeless tobacco: Never Used  . Alcohol use Yes     Comment: daily    Review of Systems Patient denies headaches, rhinorrhea, blurry vision, numbness, shortness of breath, chest pain, edema, cough, abdominal pain, nausea, vomiting, diarrhea, dysuria, fevers, rashes or hallucinations unless otherwise stated above in HPI. ____________________________________________   PHYSICAL EXAM:  VITAL SIGNS: Vitals:   09/29/16 2000 09/29/16 2008  BP: (!) 145/95   Pulse: 85 84  Resp: (!) 21 20  Temp:    SpO2: 96% 97%    Constitutional: Alert and oriented. Uncomfortable appearing but in no acute distress. Eyes: Conjunctivae are normal.  Head: Atraumatic. Nose: No congestion/rhinnorhea. Mouth/Throat: Mucous membranes are moist.   Neck: No stridor. Painless ROM.  Cardiovascular: Normal rate, regular rhythm. Grossly normal heart sounds.  Good peripheral circulation. Respiratory: Normal respiratory effort.  No retractions. Lungs CTAB. Gastrointestinal: Soft and nontender. No distention. No abdominal bruits. No CVA tenderness. Genitourinary:  Musculoskeletal: No lower extremity tenderness nor edema.  No joint effusions. Neurologic:  Normal speech and language. No gross focal neurologic deficits are appreciated. No facial droop Skin:  Skin is warm, dry and intact. No rash noted.  Psychiatric: Mood and affect are normal. Speech and behavior are normal.  ____________________________________________   LABS (all labs ordered are listed, but only abnormal results are displayed)  Results for orders placed or performed during the hospital encounter of 09/29/16 (from the past 24 hour(s))  Basic metabolic panel     Status: Abnormal    Collection Time: 09/29/16  5:06 PM  Result Value Ref Range   Sodium 140 135 - 145 mmol/L   Potassium 4.1 3.5 - 5.1 mmol/L   Chloride 107 101 - 111 mmol/L   CO2 23 22 - 32 mmol/L   Glucose, Bld 115 (H) 65 - 99 mg/dL   BUN 26 (H) 6 - 20 mg/dL   Creatinine, Ser 2.13 0.61 - 1.24 mg/dL   Calcium 9.3 8.9 - 08.6 mg/dL   GFR calc non Af Amer >60 >60 mL/min   GFR calc Af Amer >60 >60 mL/min   Anion gap 10 5 - 15  CBC     Status: Abnormal   Collection Time: 09/29/16  5:06 PM  Result Value Ref Range   WBC 8.6 3.8 - 10.6 K/uL   RBC 4.35 (L) 4.40 - 5.90 MIL/uL   Hemoglobin 15.6 13.0 - 18.0 g/dL   HCT 57.8 46.9 - 62.9 %   MCV 100.2 (H) 80.0 - 100.0 fL   MCH 35.8 (H) 26.0 - 34.0 pg   MCHC 35.7 32.0 - 36.0 g/dL   RDW 52.8 41.3 - 24.4 %   Platelets 196 150 - 440 K/uL  Troponin I     Status: None   Collection Time: 09/29/16  5:06 PM  Result Value Ref Range   Troponin I <0.03 <0.03 ng/mL  Protime-INR     Status: Abnormal   Collection Time: 09/29/16  5:06 PM  Result Value Ref Range   Prothrombin Time 15.3 (H) 11.4 - 15.2 seconds   INR 1.22    ____________________________________________  EKG My review and personal interpretation at Time: 17:01   Indication: chest pain  Rate: 115  Rhythm: sinus Axis: normal Other: normal intervals, poor r wave progression, non specific st changes, no depressions   My review and personal interpretation at Time: 18:56   Indication: chest pain  Rate: 95  Rhythm: sinus Axis: normal Other: normal intervals, poor r wave progression, non specific st changes, no depressions ____________________________________________  RADIOLOGY  I personally reviewed all radiographic images ordered to evaluate for the above acute complaints and reviewed radiology reports and findings.  These findings were personally discussed with the patient.  Please see medical record for radiology report.  ____________________________________________   PROCEDURES  Procedure(s)  performed:  Procedures    Critical Care performed: no ____________________________________________   INITIAL IMPRESSION / ASSESSMENT AND PLAN / ED COURSE  Pertinent labs & imaging results that were available during my care of the patient were reviewed by me and considered in my medical decision making (see chart for details).  DDX: ACS, pericarditis, esophagitis, boerhaaves, pe, dissection, pna, bronchitis, costochondritis   Yale Golla. is a 61 y.o. who presents to the ED with atypical chest pain as described above. Initial EKG shows nonspecific changes. Patient with good pulses throughout. CT angiogram ordered due to concern for dissection. Patient received aspirin nitrates and fentanyl on route. His abdominal exam is soft and benign. He is otherwise nontoxic.  The patient will be placed on continuous pulse oximetry and telemetry for monitoring.  Laboratory evaluation will be sent to evaluate for the above complaints.  Clinical Course as of Sep 29 2013  Thu Sep 29, 2016  1610 reassessed patient and discussed results of CT imaging. Patient states that he has only had mild improvement in symptoms. Based on his presentation I am concerned for unstable angina. Patient received aspirin. I recommended admission to the hospital the patient is declining this.  [PR]  1914 I spent quite a bit of time with the patient expired in my concerns that he is having some component of ACS or heart disease and damage that is presenting as his chest pain. Patient states that he will not be admitted to the hospital even if it means that he would die. States that he would rather die tonight the be admitted to the hospital. States "if it's my time to go, it's my time." I tried to probe and asked if there is anything that we could do to help convince him otherwise as his condition is very treatable but the patient is persistent in that he will be leaving and refusing to stay in the hospital. I had a very frank  conversation with him and stated that he should be admitted for further evaluation and management and that leaving against medical advice may result in worsening pain, suffering, disability and death. Patient states that he understands but will not stay.  This conversation was witnessed by the patient's sister.  [PR]  1919 DG Chest 2 View [PR]  1923 patient demanding to be discharged at this point. A spoke with cardiology Dr. Antoine Poche regarding his presentation. As he is wanting to leave AMA we will still treat him medically with aspirin nitrates as well as beta blockers. Patient will go to follow-up in clinic. Encouraged patient to return to the ER should he change his mind about wanting further treatment.  [PR]    Clinical Course User Index [PR] Willy Eddy, MD     ____________________________________________   FINAL CLINICAL IMPRESSION(S) / ED DIAGNOSES  Final diagnoses:  Chest pain, unspecified type  Abnormal CT of the chest      NEW MEDICATIONS STARTED DURING THIS VISIT:  New Prescriptions   ASPIRIN EC 325 MG TABLET    Take 1 tablet (325 mg total) by mouth daily.   ISOSORBIDE MONONITRATE (IMDUR) 30 MG 24 HR TABLET    Take 1 tablet (30 mg total) by mouth daily.   METOPROLOL TARTRATE (LOPRESSOR) 25 MG TABLET    Take 1 tablet (25 mg total) by mouth 2 (two) times daily.     Note:  This document was prepared using Dragon voice recognition software and may include unintentional dictation errors.    Willy Eddy, MD 09/29/16 Barnie Mort    Willy Eddy, MD 09/29/16 2015

## 2016-10-20 NOTE — Progress Notes (Signed)
Cardiology Office Note   Date:  10/22/2016   ID:  Jason Cove., DOB [birth year], MRN [id number]  PCP:  Patient, No Pcp Per  Cardiologist:   Rollene Rotunda, MD    Chief Complaint  Patient presents with  . Chest Pain      History of Present Illness: Jason Flavell. is a 60 y.o. male who presents for evaluation of chest pain.  He has was in the ED for this recently.  I reviewed these records for this visit.   It was thought that he was having unstable angina.  However, he left the hospital AMA.  He had a CT which demonstrated LAD calcification.   He reports that his symptoms started about 2 months ago. He been getting some chest discomfort that he thought was heartburn. It is getting slowly worse. Today he went to the emergency room in late September it was severe discomfort. He boss called the ambulance. Sternal and felt like somebody was sitting on him.  He has diaphoresis with this. He has since continued to have episodes of this with exertion and occasionally at rest. He has to just let it stop.  In the emergency room he was started on metoprolol and isosorbide. However, he's not taking the metoprolol because it caused migraine. He is however taking isosorbide and the aspirin.  Past Medical History:  Diagnosis Date  . Arthritis   . HTN (hypertension)     Past Surgical History:  Procedure Laterality Date  . HERNIA REPAIR     Right inguinal     Current Outpatient Prescriptions  Medication Sig Dispense Refill  . aspirin EC 325 MG tablet Take 1 tablet (325 mg total) by mouth daily. 100 tablet 3  . isosorbide mononitrate (IMDUR) 30 MG 24 hr tablet Take 1 tablet (30 mg total) by mouth daily. 30 tablet 11  . naproxen (NAPROSYN) 500 MG tablet Take 1 tablet (500 mg total) by mouth 2 (two) times daily with a meal. 30 tablet 0  . nitroGLYCERIN (NITROSTAT) 0.4 MG SL tablet Place 1 tablet (0.4 mg total) under the tongue every 5 (five) minutes as needed for chest pain. 25 tablet 3    No current facility-administered medications for this visit.     Allergies:   Patient has no known allergies.    Social History:  The patient  reports that he has never smoked. He has never used smokeless tobacco. He reports that he drinks alcohol. He reports that he does not use drugs.   Family History:  The patient's family history includes Cancer in his father and mother.   No early CAD.      ROS:  Please see the history of present illness.   Otherwise, review of systems are positive for allergies.   All other systems are reviewed and negative.    PHYSICAL EXAM: VS:  BP (!) 160/88   Pulse 88   Ht  (1.753 m)   Wt 238 lb (108 kg)   BMI 35.15 kg/m  , BMI Body mass index is 35.15 kg/m. GENERAL:  Well appearing HEENT:  Pupils equal round and reactive, fundi not visualized, oral mucosa unremarkable NECK:  No jugular venous distention, waveform within normal limits, carotid upstroke brisk and symmetric, no bruits, no thyromegaly LYMPHATICS:  No cervical, inguinal adenopathy LUNGS:  Clear to auscultation bilaterally BACK:  No CVA tenderness CHEST:  Unremarkable HEART:  PMI not displaced or sustained,S1 and S2 within normal limits, no S3, no S4, no  clicks, no rubs, no murmurs ABD:  Flat, positive bowel sounds normal in frequency in pitch, no bruits, no rebound, no guarding, no midline pulsatile mass, no hepatomegaly, no splenomegaly EXT:  2 plus pulses throughout, no edema, no cyanosis no clubbing SKIN:  No rashes no nodules NEURO:  Cranial nerves II through XII grossly intact, motor grossly intact throughout PSYCH:  Cognitively intact, oriented to person place and time    EKG:  EKG is ordered today. The ekg ordered today demonstrates sinus rhythm, rate 74, axis within normal. Intervals within normal limits, no acute ST-T wave changes.   Recent Labs: 10/21/2016: ALT 36; BUN 14; Creatinine, Ser 0.79; Hemoglobin 14.2; Platelets 182; Potassium 4.1; Sodium 143; TSH 0.880     Lipid Panel    Component Value Date/Time   CHOL 194 10/21/2016 0845   TRIG 142 10/21/2016 0845   HDL 52 10/21/2016 0845   CHOLHDL 3.7 10/21/2016 0845   LDLCALC 114 (H) 10/21/2016 0845      Wt Readings from Last 3 Encounters:  10/21/16 238 lb (108 kg)  09/29/16 240 lb (108.9 kg)  07/30/16 240 lb (108.9 kg)      Other studies Reviewed: Additional studies/ records that were reviewed today include: [facility] ED records. Review of the above records demonstrates:  Please see elsewhere in the note.     ASSESSMENT AND PLAN:   CHEST PAIN:  Consistent with unstable angina. I probability of obstructive coronary disease. Cardiac catheterization indicated. The patient understands that risks included but are not limited to stroke (1 in 1000), death (1 in 1000), kidney failure [usually temporary] (1 in 500), bleeding (1 in 200), allergic reaction [possibly serious] (1 in 200).  The patient understands and agrees to proceed.  I suggested that he let us do that today. He refuses but would comply to have it done next Friday. He will take his beta blocker because it causes headaches. He will take nitrates in the aspirin. Take sublingual nitroglycerin. He does promise to call 911 if he has any symptoms that required nitroglycerin. He understands very this situation.  RISK REDUCTION:  He would be very reluctant take other medications. However, I'll see if he'll let me set up a lipid profile today and try to talk him into a statin at some point. I do think however he would be compliant with dual antiplatelet therapy if necessary.   Current medicines are reviewed at length with the patient today.  The patient does not have concerns regarding medicines.  The following changes have been made:  As above.   Labs/ tests ordered today include:   Orders Placed This Encounter  Procedures  . Lipid panel  . CBC  . Comprehensive Metabolic Panel (CMET)  . TSH  . INR/PT  . APTT     Disposition:   FU  with me or could be seen in [place] after the cath.     Signed, Rollene Rotunda, MD  10/22/2016 4:52 PM    [facility]

## 2016-10-21 ENCOUNTER — Encounter: Payer: Self-pay | Admitting: Cardiology

## 2016-10-21 ENCOUNTER — Ambulatory Visit (INDEPENDENT_AMBULATORY_CARE_PROVIDER_SITE_OTHER): Payer: Self-pay | Admitting: Cardiology

## 2016-10-21 VITALS — BP 160/88 | HR 88 | Ht 69.0 in | Wt 238.0 lb

## 2016-10-21 DIAGNOSIS — Z79899 Other long term (current) drug therapy: Secondary | ICD-10-CM

## 2016-10-21 DIAGNOSIS — E785 Hyperlipidemia, unspecified: Secondary | ICD-10-CM

## 2016-10-21 DIAGNOSIS — I200 Unstable angina: Secondary | ICD-10-CM

## 2016-10-21 DIAGNOSIS — Z01812 Encounter for preprocedural laboratory examination: Secondary | ICD-10-CM

## 2016-10-21 MED ORDER — NITROGLYCERIN 0.4 MG SL SUBL: 0.4000 mg | SUBLINGUAL_TABLET | SUBLINGUAL | 3 refills | Status: DC | PRN

## 2016-10-21 NOTE — Patient Instructions (Signed)
Medication Instructions:  Continue current medications  If you need a refill on your cardiac medications before your next appointment, please call your pharmacy.  Labwork: Pre Op Labs today HERE IN OUR OFFICE AT [facility]  Testing/Procedures:   [facility] CARDIOVASCULAR DIVISION [facility] [address] [place] [address] Dept: [phone] Loc: [phone]  Joahan Swatzell.  10/21/2016  You are scheduled for a Cardiac Catheterization on Friday, October 19 with Dr. Bryan Lemma.  1. Please arrive at the [facility] (Main Entrance A) at [facility]: [address] [place], [place] [address] at 8:00 AM (two hours before your procedure to ensure your preparation). Free valet parking service is available.   Special note: Every effort is made to have your procedure done on time. Please understand that emergencies sometimes delay scheduled procedures.  2. Diet: Do not eat or drink anything after midnight prior to your procedure except sips of water to take medications.  3. Labs: You will need to have blood drawn on Friday, October 12 at [facility] [address], [place]  Open: 8am - 5pm (Lunch 12:30 - 1:30)   Phone: [phone]. You do not need to be fasting.  4. Medication instructions in preparation for your procedure:    Current Outpatient Prescriptions (Cardiovascular):  .  isosorbide mononitrate (IMDUR) 30 MG 24 hr tablet, Take 1 tablet (30 mg total) by mouth daily.   Current Outpatient Prescriptions (Analgesics):  .  aspirin EC 325 MG tablet, Take 1 tablet (325 mg total) by mouth daily. .  naproxen (NAPROSYN) 500 MG tablet, Take 1 tablet (500 mg total) by mouth 2 (two) times daily with a meal.   *For reference purposes while preparing patient instructions.   Delete this med list prior to printing instructions for patient.*  None  On the morning of your procedure, take your morning  medication.  You may use sips of water.  5. Plan for one night stay--bring personal belongings. 6. Bring a current list of your medications and current insurance cards. 7. You MUST have a responsible person to drive you home. 8. Someone MUST be with you the first 24 hours after you arrive home or your discharge will be delayed. 9. Please wear clothes that are easy to get on and off and wear slip-on shoes.  Thank you for allowing [place] to care for you!   -- [facility]   Follow-Up: Your physician wants you to follow-up in: After Cath.    Thank you for choosing [facility] at [facility]!!

## 2016-10-22 ENCOUNTER — Encounter: Payer: Self-pay | Admitting: Cardiology

## 2016-10-22 DIAGNOSIS — I200 Unstable angina: Secondary | ICD-10-CM | POA: Insufficient documentation

## 2016-10-22 DIAGNOSIS — E785 Hyperlipidemia, unspecified: Secondary | ICD-10-CM | POA: Insufficient documentation

## 2016-10-22 DIAGNOSIS — Z79899 Other long term (current) drug therapy: Secondary | ICD-10-CM | POA: Insufficient documentation

## 2016-10-22 LAB — COMPREHENSIVE METABOLIC PANEL
ALT: 36 IU/L (ref 0–44)
AST: 26 IU/L (ref 0–40)
Albumin/Globulin Ratio: 2 (ref 1.2–2.2)
Albumin: 4.3 g/dL (ref 3.6–4.8)
Alkaline Phosphatase: 90 IU/L (ref 39–117)
BUN / CREAT RATIO: 18 (ref 10–24)
BUN: 14 mg/dL (ref 8–27)
Bilirubin Total: 0.4 mg/dL (ref 0.0–1.2)
CALCIUM: 9.1 mg/dL (ref 8.6–10.2)
CO2: 22 mmol/L (ref 20–29)
Chloride: 104 mmol/L (ref 96–106)
Creatinine, Ser: 0.79 mg/dL (ref 0.76–1.27)
GFR calc Af Amer: 113 mL/min/{1.73_m2} (ref >59)
GFR, EST NON AFRICAN AMERICAN: 98 mL/min/{1.73_m2} (ref >59)
GLUCOSE: 88 mg/dL (ref 65–99)
Globulin, Total: 2.1 g/dL (ref 1.5–4.5)
POTASSIUM: 4.1 mmol/L (ref 3.5–5.2)
SODIUM: 143 mmol/L (ref 134–144)
Total Protein: 6.4 g/dL (ref 6.0–8.5)

## 2016-10-22 LAB — LIPID PANEL
CHOLESTEROL TOTAL: 194 mg/dL (ref 100–199)
Chol/HDL Ratio: 3.7 ratio (ref 0.0–5.0)
HDL: 52 mg/dL (ref >39)
LDL Calculated: 114 mg/dL — ABNORMAL HIGH (ref 0–99)
TRIGLYCERIDES: 142 mg/dL (ref 0–149)
VLDL CHOLESTEROL CAL: 28 mg/dL (ref 5–40)

## 2016-10-22 LAB — PROTIME-INR
INR: 1 (ref 0.8–1.2)
Prothrombin Time: 10.3 s (ref 9.1–12.0)

## 2016-10-22 LAB — CBC
Hematocrit: 40.7 % (ref 37.5–51.0)
Hemoglobin: 14.2 g/dL (ref 13.0–17.7)
MCH: 35.5 pg — AB (ref 26.6–33.0)
MCHC: 34.9 g/dL (ref 31.5–35.7)
MCV: 102 fL — ABNORMAL HIGH (ref 79–97)
PLATELETS: 182 10*3/uL (ref 150–379)
RBC: 4 x10E6/uL — AB (ref 4.14–5.80)
RDW: 14.5 % (ref 12.3–15.4)
WBC: 4 10*3/uL (ref 3.4–10.8)

## 2016-10-22 LAB — APTT: aPTT: 26 s (ref 24–33)

## 2016-10-22 LAB — TSH: TSH: 0.88 u[IU]/mL (ref 0.450–4.500)

## 2016-10-25 NOTE — Addendum Note (Signed)
Addended by: Barrie Dunker on: 10/25/2016 08:34 AM   Modules accepted: Orders

## 2016-10-27 ENCOUNTER — Telehealth: Payer: Self-pay

## 2016-10-27 NOTE — Telephone Encounter (Signed)
Outreach made to Pt.  Call went to VM, per message Pt has not set up VM.  Unable to reach Pt.

## 2016-10-28 ENCOUNTER — Ambulatory Visit (HOSPITAL_COMMUNITY)
Admission: RE | Disposition: A | Discharge status: Home / Self Care | Payer: Self-pay | Source: Ambulatory Visit | Attending: Cardiology

## 2016-10-28 ENCOUNTER — Ambulatory Visit (HOSPITAL_COMMUNITY)
Admission: RE | Admit: 2016-10-28 | Discharge: 2016-10-28 | Disposition: A | Discharge status: Home / Self Care | Payer: Self-pay | Source: Ambulatory Visit | Attending: Cardiology | Admitting: Cardiology

## 2016-10-28 DIAGNOSIS — I10 Essential (primary) hypertension: Secondary | ICD-10-CM | POA: Insufficient documentation

## 2016-10-28 DIAGNOSIS — M1990 Unspecified osteoarthritis, unspecified site: Secondary | ICD-10-CM | POA: Insufficient documentation

## 2016-10-28 DIAGNOSIS — I200 Unstable angina: Secondary | ICD-10-CM | POA: Diagnosis present

## 2016-10-28 DIAGNOSIS — I25110 Atherosclerotic heart disease of native coronary artery with unstable angina pectoris: Secondary | ICD-10-CM | POA: Insufficient documentation

## 2016-10-28 DIAGNOSIS — Z7982 Long term (current) use of aspirin: Secondary | ICD-10-CM | POA: Insufficient documentation

## 2016-10-28 HISTORY — PX: LEFT HEART CATH AND CORONARY ANGIOGRAPHY: CATH118249

## 2016-10-28 HISTORY — PX: INTRAVASCULAR PRESSURE WIRE/FFR STUDY: CATH118243

## 2016-10-28 LAB — POCT ACTIVATED CLOTTING TIME: ACTIVATED CLOTTING TIME: 241 s

## 2016-10-28 SURGERY — LEFT HEART CATH AND CORONARY ANGIOGRAPHY
Anesthesia: LOCAL

## 2016-10-28 MED ORDER — LIDOCAINE HCL 2 % IJ SOLN
INTRAMUSCULAR | Status: AC
  Filled 2016-10-28: qty 10

## 2016-10-28 MED ORDER — MIDAZOLAM HCL 2 MG/2ML IJ SOLN
INTRAMUSCULAR | Status: AC
  Filled 2016-10-28: qty 2

## 2016-10-28 MED ORDER — MORPHINE SULFATE (PF) 10 MG/ML IV SOLN: 2.0000 mg | INTRAVENOUS | Status: DC | PRN

## 2016-10-28 MED ORDER — ACETAMINOPHEN 325 MG PO TABS: 650.0000 mg | ORAL_TABLET | ORAL | Status: DC | PRN

## 2016-10-28 MED ORDER — ADENOSINE (DIAGNOSTIC) 140MCG/KG/MIN
INTRAVENOUS | Status: DC | PRN
Start: 2016-10-28 — End: 2016-10-28
  Administered 2016-10-28: 140 ug/kg/min via INTRAVENOUS

## 2016-10-28 MED ORDER — HEPARIN SODIUM (PORCINE) 1000 UNIT/ML IJ SOLN
INTRAMUSCULAR | Status: AC
  Filled 2016-10-28: qty 1

## 2016-10-28 MED ORDER — SODIUM CHLORIDE 0.9% FLUSH: 3.0000 mL | Freq: Two times a day (BID) | INTRAVENOUS | Status: DC

## 2016-10-28 MED ORDER — AMLODIPINE BESYLATE 2.5 MG PO TABS: 2.5000 mg | ORAL_TABLET | Freq: Every day | ORAL | Status: DC

## 2016-10-28 MED ORDER — ADENOSINE 12 MG/4ML IV SOLN
INTRAVENOUS | Status: AC
  Filled 2016-10-28: qty 16

## 2016-10-28 MED ORDER — SODIUM CHLORIDE 0.9 % WEIGHT BASED INFUSION
3.0000 mL/kg/h | INTRAVENOUS | Status: AC
  Administered 2016-10-28: 3 mL/kg/h via INTRAVENOUS

## 2016-10-28 MED ORDER — IOPAMIDOL (ISOVUE-370) INJECTION 76%
INTRAVENOUS | Status: AC
  Filled 2016-10-28: qty 100

## 2016-10-28 MED ORDER — IOPAMIDOL (ISOVUE-370) INJECTION 76%
INTRAVENOUS | Status: AC
  Filled 2016-10-28: qty 50

## 2016-10-28 MED ORDER — SODIUM CHLORIDE 0.9% FLUSH: 3.0000 mL | INTRAVENOUS | Status: DC | PRN

## 2016-10-28 MED ORDER — IOPAMIDOL (ISOVUE-370) INJECTION 76%
INTRAVENOUS | Status: DC | PRN
  Administered 2016-10-28: 80 mL via INTRA_ARTERIAL

## 2016-10-28 MED ORDER — HEPARIN (PORCINE) IN NACL 2-0.9 UNIT/ML-% IJ SOLN
INTRAMUSCULAR | Status: AC | PRN
  Administered 2016-10-28: 1000 mL

## 2016-10-28 MED ORDER — VERAPAMIL HCL 2.5 MG/ML IV SOLN
INTRAVENOUS | Status: AC
  Filled 2016-10-28: qty 2

## 2016-10-28 MED ORDER — HEPARIN (PORCINE) IN NACL 2-0.9 UNIT/ML-% IJ SOLN
INTRAMUSCULAR | Status: AC
  Filled 2016-10-28: qty 1000

## 2016-10-28 MED ORDER — ONDANSETRON HCL 4 MG/2ML IJ SOLN: 4.0000 mg | Freq: Four times a day (QID) | INTRAMUSCULAR | Status: DC | PRN

## 2016-10-28 MED ORDER — SODIUM CHLORIDE 0.9 % IV SOLN: INTRAVENOUS | Status: AC

## 2016-10-28 MED ORDER — LIDOCAINE HCL 2 % IJ SOLN
INTRAMUSCULAR | Status: DC | PRN
  Administered 2016-10-28: 5 mL

## 2016-10-28 MED ORDER — ASPIRIN 81 MG PO CHEW: 324.0000 mg | CHEWABLE_TABLET | ORAL | Status: DC

## 2016-10-28 MED ORDER — SODIUM CHLORIDE 0.9 % WEIGHT BASED INFUSION: 1.0000 mL/kg/h | INTRAVENOUS | Status: DC

## 2016-10-28 MED ORDER — HEPARIN SODIUM (PORCINE) 1000 UNIT/ML IJ SOLN
INTRAMUSCULAR | Status: DC | PRN
  Administered 2016-10-28: 6000 [IU] via INTRAVENOUS
  Administered 2016-10-28: 2000 [IU] via INTRAVENOUS
  Administered 2016-10-28: 3500 [IU] via INTRAVENOUS

## 2016-10-28 MED ORDER — MIDAZOLAM HCL 2 MG/2ML IJ SOLN
INTRAMUSCULAR | Status: DC | PRN
  Administered 2016-10-28: 2 mg via INTRAVENOUS
  Administered 2016-10-28: 1 mg via INTRAVENOUS

## 2016-10-28 MED ORDER — FENTANYL CITRATE (PF) 100 MCG/2ML IJ SOLN
INTRAMUSCULAR | Status: AC
  Filled 2016-10-28: qty 2

## 2016-10-28 MED ORDER — SODIUM CHLORIDE 0.9 % IV SOLN: 250.0000 mL | INTRAVENOUS | Status: DC | PRN

## 2016-10-28 MED ORDER — AMLODIPINE BESYLATE 2.5 MG PO TABS: 2.5000 mg | ORAL_TABLET | Freq: Every day | ORAL | 3 refills | Status: DC

## 2016-10-28 MED ORDER — FENTANYL CITRATE (PF) 100 MCG/2ML IJ SOLN
INTRAMUSCULAR | Status: DC | PRN
  Administered 2016-10-28: 50 ug via INTRAVENOUS

## 2016-10-28 SURGICAL SUPPLY — 11 items
CATH OPTITORQUE TIG 4.0 5F (CATHETERS) ×2 IMPLANT
CATH VISTA GUIDE 6FR XBLAD3.0 (CATHETERS) ×2 IMPLANT
DEVICE RAD COMP TR BAND LRG (VASCULAR PRODUCTS) ×2 IMPLANT
GLIDESHEATH SLEND A-KIT 6F 22G (SHEATH) ×2 IMPLANT
GUIDEWIRE INQWIRE 1.5J.035X260 (WIRE) ×1 IMPLANT
GUIDEWIRE PRESSURE COMET II (WIRE) ×2 IMPLANT
INQWIRE 1.5J .035X260CM (WIRE) ×2
KIT HEART LEFT (KITS) ×2 IMPLANT
PACK CARDIAC CATHETERIZATION (CUSTOM PROCEDURE TRAY) ×2 IMPLANT
TRANSDUCER W/STOPCOCK (MISCELLANEOUS) ×2 IMPLANT
TUBING CIL FLEX 10 FLL-RA (TUBING) ×2 IMPLANT

## 2016-10-28 NOTE — H&P (View-Only) (Signed)
  Cardiology Office Note   Date:  10/22/2016   ID:  Jason Magwood Jr., DOB 03/22/1955, MRN [id number]  PCP:  Patient, No Pcp Per  Cardiologist:   Juandavid Dallman, MD    Chief Complaint  Patient presents with  . Chest Pain      History of Present Illness: Jason Oran Jr. is a 61 y.o. male who presents for evaluation of chest pain.  He has was in the ED for this recently.  I reviewed these records for this visit.   It was thought that he was having unstable angina.  However, he left the hospital AMA.  He had a CT which demonstrated LAD calcification.   He reports that his symptoms started about 2 months ago. He been getting some chest discomfort that he thought was heartburn. It is getting slowly worse. Today he went to the emergency room in late September it was severe discomfort. He boss called the ambulance. Sternal and felt like somebody was sitting on him.  He has diaphoresis with this. He has since continued to have episodes of this with exertion and occasionally at rest. He has to just let it stop.  In the emergency room he was started on metoprolol and isosorbide. However, he's not taking the metoprolol because it caused migraine. He is however taking isosorbide and the aspirin.  Past Medical History:  Diagnosis Date  . Arthritis   . HTN (hypertension)     Past Surgical History:  Procedure Laterality Date  . HERNIA REPAIR     Right inguinal     Current Outpatient Prescriptions  Medication Sig Dispense Refill  . aspirin EC 325 MG tablet Take 1 tablet (325 mg total) by mouth daily. 100 tablet 3  . isosorbide mononitrate (IMDUR) 30 MG 24 hr tablet Take 1 tablet (30 mg total) by mouth daily. 30 tablet 11  . naproxen (NAPROSYN) 500 MG tablet Take 1 tablet (500 mg total) by mouth 2 (two) times daily with a meal. 30 tablet 0  . nitroGLYCERIN (NITROSTAT) 0.4 MG SL tablet Place 1 tablet (0.4 mg total) under the tongue every 5 (five) minutes as needed for chest pain. 25 tablet 3    No current facility-administered medications for this visit.     Allergies:   Patient has no known allergies.    Social History:  The patient  reports that he has never smoked. He has never used smokeless tobacco. He reports that he drinks alcohol. He reports that he does not use drugs.   Family History:  The patient's family history includes Cancer in his father and mother.   No early CAD.      ROS:  Please see the history of present illness.   Otherwise, review of systems are positive for allergies.   All other systems are reviewed and negative.    PHYSICAL EXAM: VS:  BP (!) 160/88   Pulse 88   Ht 5' 9" (1.753 m)   Wt 238 lb (108 kg)   BMI 35.15 kg/m  , BMI Body mass index is 35.15 kg/m. GENERAL:  Well appearing HEENT:  Pupils equal round and reactive, fundi not visualized, oral mucosa unremarkable NECK:  No jugular venous distention, waveform within normal limits, carotid upstroke brisk and symmetric, no bruits, no thyromegaly LYMPHATICS:  No cervical, inguinal adenopathy LUNGS:  Clear to auscultation bilaterally BACK:  No CVA tenderness CHEST:  Unremarkable HEART:  PMI not displaced or sustained,S1 and S2 within normal limits, no S3, no S4, no   clicks, no rubs, no murmurs ABD:  Flat, positive bowel sounds normal in frequency in pitch, no bruits, no rebound, no guarding, no midline pulsatile mass, no hepatomegaly, no splenomegaly EXT:  2 plus pulses throughout, no edema, no cyanosis no clubbing SKIN:  No rashes no nodules NEURO:  Cranial nerves II through XII grossly intact, motor grossly intact throughout PSYCH:  Cognitively intact, oriented to person place and time    EKG:  EKG is ordered today. The ekg ordered today demonstrates sinus rhythm, rate 74, axis within normal. Intervals within normal limits, no acute ST-T wave changes.   Recent Labs: 10/21/2016: ALT 36; BUN 14; Creatinine, Ser 0.79; Hemoglobin 14.2; Platelets 182; Potassium 4.1; Sodium 143; TSH 0.880     Lipid Panel    Component Value Date/Time   CHOL 194 10/21/2016 0845   TRIG 142 10/21/2016 0845   HDL 52 10/21/2016 0845   CHOLHDL 3.7 10/21/2016 0845   LDLCALC 114 (H) 10/21/2016 0845      Wt Readings from Last 3 Encounters:  10/21/16 238 lb (108 kg)  09/29/16 240 lb (108.9 kg)  07/30/16 240 lb (108.9 kg)      Other studies Reviewed: Additional studies/ records that were reviewed today include: [place] ED records. Review of the above records demonstrates:  Please see elsewhere in the note.     ASSESSMENT AND PLAN:   CHEST PAIN:  Consistent with unstable angina. I probability of obstructive coronary disease. Cardiac catheterization indicated. The patient understands that risks included but are not limited to stroke (1 in 1000), death (1 in 1000), kidney failure [usually temporary] (1 in 500), bleeding (1 in 200), allergic reaction [possibly serious] (1 in 200).  The patient understands and agrees to proceed.  I suggested that he let us do that today. He refuses but would comply to have it done next Friday. He will take his beta blocker because it causes headaches. He will take nitrates in the aspirin. Take sublingual nitroglycerin. He does promise to call 911 if he has any symptoms that required nitroglycerin. He understands very this situation.  RISK REDUCTION:  He would be very reluctant take other medications. However, I'll see if he'll let me set up a lipid profile today and try to talk him into a statin at some point. I do think however he would be compliant with dual antiplatelet therapy if necessary.   Current medicines are reviewed at length with the patient today.  The patient does not have concerns regarding medicines.  The following changes have been made:  As above.   Labs/ tests ordered today include:   Orders Placed This Encounter  Procedures  . Lipid panel  . CBC  . Comprehensive Metabolic Panel (CMET)  . TSH  . INR/PT  . APTT     Disposition:   FU  with me or could be seen in [place] after the cath.     Signed, Rollene RotundaJames Latisha Lasch, MD  10/22/2016 4:52 PM    [facility]

## 2016-10-28 NOTE — Interval H&P Note (Signed)
History and Physical Interval Note:  10/28/2016 9:37 AM  Jason CoveFoster Harjo Jr.  has presented today for surgery, with the diagnosis of cp, unstable angina; coronary artery calcification on CT.   The various methods of treatment have been discussed with the patient and family. After consideration of risks, benefits and other options for treatment, the patient has consented to  Procedure(s): LEFT HEART CATH AND CORONARY ANGIOGRAPHY (N/A) with possible PERCUTANEOUS CORONARY INTERVENTION as a surgical intervention .  The patient's history has been reviewed, patient examined, no change in status, stable for surgery.  I have reviewed the patient's chart and labs.  Questions were answered to the patient's satisfaction.    Cath Lab Visit (complete for each Cath Lab visit)  Clinical Evaluation Leading to the Procedure:   ACS: No.  Non-ACS:    Anginal Classification: CCS III  Anti-ischemic medical therapy: Minimal Therapy (1 class of medications)  Non-Invasive Test Results: No non-invasive testing performed  Prior CABG: No previous CABG   Bryan Lemmaavid Harding

## 2016-10-28 NOTE — Discharge Instructions (Signed)

## 2016-10-31 ENCOUNTER — Encounter (HOSPITAL_COMMUNITY): Payer: Self-pay | Admitting: Cardiology

## 2016-10-31 MED FILL — Verapamil HCl IV Soln 2.5 MG/ML: INTRAVENOUS | Qty: 2 | Status: AC

## 2016-11-01 ENCOUNTER — Emergency Department
Admission: EM | Admit: 2016-11-01 | Discharge: 2016-11-01 | Disposition: A | Discharge status: Home / Self Care | Payer: Self-pay

## 2016-11-02 ENCOUNTER — Telehealth: Payer: Self-pay | Admitting: Emergency Medicine

## 2016-11-02 NOTE — Telephone Encounter (Signed)
Called patient due to lwot to inquire about condition and follow up plans.  No answer and no voicemail  

## 2017-08-14 ENCOUNTER — Other Ambulatory Visit: Payer: Self-pay

## 2017-08-14 ENCOUNTER — Emergency Department
Admission: EM | Admit: 2017-08-14 | Discharge: 2017-08-14 | Disposition: A | Discharge status: Home / Self Care | Payer: Self-pay | Attending: Emergency Medicine | Admitting: Emergency Medicine

## 2017-08-14 ENCOUNTER — Encounter: Payer: Self-pay | Admitting: Emergency Medicine

## 2017-08-14 DIAGNOSIS — T5492XA Toxic effect of unspecified corrosive substance, intentional self-harm, initial encounter: Secondary | ICD-10-CM | POA: Insufficient documentation

## 2017-08-14 DIAGNOSIS — R748 Abnormal levels of other serum enzymes: Secondary | ICD-10-CM | POA: Insufficient documentation

## 2017-08-14 DIAGNOSIS — F4325 Adjustment disorder with mixed disturbance of emotions and conduct: Secondary | ICD-10-CM

## 2017-08-14 DIAGNOSIS — Z79899 Other long term (current) drug therapy: Secondary | ICD-10-CM | POA: Insufficient documentation

## 2017-08-14 DIAGNOSIS — Z7982 Long term (current) use of aspirin: Secondary | ICD-10-CM | POA: Insufficient documentation

## 2017-08-14 DIAGNOSIS — I10 Essential (primary) hypertension: Secondary | ICD-10-CM | POA: Insufficient documentation

## 2017-08-14 DIAGNOSIS — F1994 Other psychoactive substance use, unspecified with psychoactive substance-induced mood disorder: Secondary | ICD-10-CM

## 2017-08-14 DIAGNOSIS — F329 Major depressive disorder, single episode, unspecified: Secondary | ICD-10-CM | POA: Insufficient documentation

## 2017-08-14 DIAGNOSIS — T5491XA Toxic effect of unspecified corrosive substance, accidental (unintentional), initial encounter: Secondary | ICD-10-CM

## 2017-08-14 DIAGNOSIS — R45851 Suicidal ideations: Secondary | ICD-10-CM | POA: Insufficient documentation

## 2017-08-14 DIAGNOSIS — F10929 Alcohol use, unspecified with intoxication, unspecified: Secondary | ICD-10-CM | POA: Insufficient documentation

## 2017-08-14 DIAGNOSIS — F1010 Alcohol abuse, uncomplicated: Secondary | ICD-10-CM

## 2017-08-14 HISTORY — DX: Alcohol abuse, uncomplicated: F10.10

## 2017-08-14 LAB — URINE DRUG SCREEN, QUALITATIVE (ARMC ONLY)
Amphetamines, Ur Screen: NOT DETECTED
BARBITURATES, UR SCREEN: NOT DETECTED
CANNABINOID 50 NG, UR ~~LOC~~: NOT DETECTED
Cocaine Metabolite,Ur ~~LOC~~: NOT DETECTED
MDMA (Ecstasy)Ur Screen: NOT DETECTED
METHADONE SCREEN, URINE: NOT DETECTED
OPIATE, UR SCREEN: NOT DETECTED
PHENCYCLIDINE (PCP) UR S: NOT DETECTED
Tricyclic, Ur Screen: NOT DETECTED

## 2017-08-14 LAB — COMPREHENSIVE METABOLIC PANEL
ALBUMIN: 4.6 g/dL (ref 3.5–5.0)
ALT: 53 U/L — AB (ref 0–44)
AST: 36 U/L (ref 15–41)
Alkaline Phosphatase: 69 U/L (ref 38–126)
Anion gap: 9 (ref 5–15)
BUN: 16 mg/dL (ref 8–23)
CHLORIDE: 111 mmol/L (ref 98–111)
CO2: 20 mmol/L — ABNORMAL LOW (ref 22–32)
Calcium: 8.9 mg/dL (ref 8.9–10.3)
Creatinine, Ser: 0.94 mg/dL (ref 0.61–1.24)
GFR calc Af Amer: >60 mL/min (ref >60)
GFR calc non Af Amer: >60 mL/min (ref >60)
GLUCOSE: 122 mg/dL — AB (ref 70–99)
Potassium: 3.7 mmol/L (ref 3.5–5.1)
SODIUM: 140 mmol/L (ref 135–145)
Total Bilirubin: 0.5 mg/dL (ref 0.3–1.2)
Total Protein: 7.8 g/dL (ref 6.5–8.1)

## 2017-08-14 LAB — CBC WITH DIFFERENTIAL/PLATELET
BASOS PCT: 1 %
Basophils Absolute: 0 10*3/uL (ref 0–0.1)
Eosinophils Absolute: 0.1 10*3/uL (ref 0–0.7)
Eosinophils Relative: 1 %
HEMATOCRIT: 43.2 % (ref 40.0–52.0)
HEMOGLOBIN: 15.3 g/dL (ref 13.0–18.0)
LYMPHS ABS: 1.4 10*3/uL (ref 1.0–3.6)
LYMPHS PCT: 23 %
MCH: 35 pg — AB (ref 26.0–34.0)
MCHC: 35.5 g/dL (ref 32.0–36.0)
MCV: 98.8 fL (ref 80.0–100.0)
MONOS PCT: 12 %
Monocytes Absolute: 0.7 10*3/uL (ref 0.2–1.0)
NEUTROS ABS: 4 10*3/uL (ref 1.4–6.5)
NEUTROS PCT: 63 %
Platelets: 211 10*3/uL (ref 150–440)
RBC: 4.37 MIL/uL — ABNORMAL LOW (ref 4.40–5.90)
RDW: 14 % (ref 11.5–14.5)
WBC: 6.3 10*3/uL (ref 3.8–10.6)

## 2017-08-14 LAB — URINALYSIS, COMPLETE (UACMP) WITH MICROSCOPIC
BACTERIA UA: NONE SEEN
Bilirubin Urine: NEGATIVE
Glucose, UA: NEGATIVE mg/dL
HGB URINE DIPSTICK: NEGATIVE
Ketones, ur: NEGATIVE mg/dL
LEUKOCYTES UA: NEGATIVE
Nitrite: NEGATIVE
Protein, ur: NEGATIVE mg/dL
SPECIFIC GRAVITY, URINE: 1.021 (ref 1.005–1.030)
pH: 5 (ref 5.0–8.0)

## 2017-08-14 LAB — SALICYLATE LEVEL

## 2017-08-14 LAB — TROPONIN I: Troponin I: <0.03 ng/mL (ref <0.03)

## 2017-08-14 LAB — ETHANOL: Alcohol, Ethyl (B): 139 mg/dL — ABNORMAL HIGH (ref <10)

## 2017-08-14 LAB — LIPASE, BLOOD: Lipase: 197 U/L — ABNORMAL HIGH (ref 11–51)

## 2017-08-14 LAB — ACETAMINOPHEN LEVEL

## 2017-08-14 MED ORDER — LORAZEPAM 2 MG PO TABS
2.0000 mg | ORAL_TABLET | ORAL | Status: AC
  Administered 2017-08-14: 2 mg via ORAL
  Filled 2017-08-14: qty 1

## 2017-08-14 MED ORDER — LORAZEPAM 0.5 MG PO TABS
ORAL_TABLET | ORAL | Status: AC
  Administered 2017-08-14: 18:00:00
  Filled 2017-08-14: qty 2

## 2017-08-14 NOTE — Discharge Instructions (Addendum)
You have been seen in the Emergency Department (ED) today for a psychiatric complaint as well as for drinking some household bleach.  You have been evaluated by psychiatry and we believe you are safe to be discharged from the hospital.   There is not indication for any further management or treatment for drinking the bleach.  Be sure and drink plenty of water and eat normally.  Your only lab test that was significantly abnormal was your lipase.  It is elevated, which may indicate pancreatitis, but may also be related to your daily alcohol use.  Please try to decrease the amount of alcohol you are drinking and follow up with a GI specialist such as Dr. Tobi BastosAnna.  If you develop abdominal pain, nausea and vomiting, fever, or other symptoms that concern you, please return immediately to the Emergency Department.  Please return to the ED immediately if you have ANY thoughts of hurting yourself or anyone else, so that we may help you.  Please avoid alcohol and drug use.  Follow up with your doctor and/or therapist as soon as possible regarding today's ED visit.   Please follow up any other recommendations and clinic appointments provided by the psychiatry team that saw you in the Emergency Department.

## 2017-08-14 NOTE — Consult Note (Signed)
[facility] Face-to-Face Psychiatry Consult   Reason for Consult: Consult for this 62 year old man brought in after someone called the police when he consumed some bleach Referring Physician: Karma Greaser Patient Identification: Jason Wiggins. MRN:  [id number] Principal Diagnosis: Adjustment disorder with mixed disturbance of emotions and conduct Diagnosis:   Patient Active Problem List   Diagnosis Date Noted  . Bleach ingestion [T54.91XA] 08/14/2017  . Adjustment disorder with mixed disturbance of emotions and conduct [F43.25] 08/14/2017  . Substance induced mood disorder ([place]) [F19.94] 08/14/2017  . Alcohol abuse [F10.10] 08/14/2017  . Unstable angina ([place]) [I20.0] 10/22/2016  . Hyperlipidemia [E78.5] 10/22/2016  . Medication management [Z79.899] 10/22/2016    Total Time spent with patient: 1 hour  Subjective:   Jason Wiggins. is a 62 y.o. male patient admitted with "I was just stressing".  HPI: Patient seen chart reviewed.  62 year old man with a history of alcohol abuse.  1 of his sisters called 57 when she discovered that he had consumed some bleach today during an argument with other family members.  Patient says that he was in a argument with several of his family members including his grand niece over whether or not she was going to clean the kitchen the way that he wanted her to.  This escalated to the point where people were yelling profanities at each other.  Patient felt very frustrated with that going on on top of the stress he feels from being out of work.  Also, he was intoxicated having consumed to pain and "screwdrivers" today.  He grabbed a bottle of cleaning product bleach and drank a slug out of it.  He says he had no thought about wanting to die or wanting to kill himself he just felt stressed out and did not know how to express himself.  He immediately went out back and vomited.  Patient denies any suicidal ideation intent or plan denies homicidal ideation.  Admits that he drinks  heavily about a 12 pack a day most days.  Denies that he is using any other drugs.  Mood feels anxious and depressed much of the time.  He lost his job about a month ago under what he considers unfair circumstances.  Has a lot of complaints about the situation at home.  Social history: Living with a bunch of his extended family here.  Lost his job a month ago got fired cannot get unemployment worries a lot about money.  Medical history: History of unstable angina hyperlipidemia  Substance abuse history: Patient has a history of opiate and cocaine abuse before he moved to [place].  Still drinks heavily.  No history of DTs or seizures.  Past Psychiatric History: Patient has been seen previously here under similar circumstances.  He was drinking at that time and had also consumed some bleach were made some suicidal statements.  Patient denies ever actually trying to kill himself.  Never really been cooperative with recommended outpatient psychiatric treatment.  Risk to Self:   Risk to Others:   Prior Inpatient Therapy:   Prior Outpatient Therapy:    Past Medical History:  Past Medical History:  Diagnosis Date  . Alcohol abuse    possibly as much as 24 beers per day  . Arthritis   . HTN (hypertension)     Past Surgical History:  Procedure Laterality Date  . HERNIA REPAIR     Right inguinal  . INTRAVASCULAR PRESSURE WIRE/FFR STUDY N/A 10/28/2016   Procedure: INTRAVASCULAR PRESSURE WIRE/FFR STUDY;  Surgeon: Ellyn Hack,  Leonie Green, MD;  Location: [place] [facility];  Service: Cardiovascular;  Laterality: N/A;  . LEFT HEART CATH AND CORONARY ANGIOGRAPHY N/A 10/28/2016   Procedure: LEFT HEART CATH AND CORONARY ANGIOGRAPHY;  Surgeon: Leonie Man, MD;  Location: [place] [facility];  Service: Cardiovascular;  Laterality: N/A;   Family History:  Family History  Problem Relation Age of Onset  . Cancer Mother   . Cancer Father    Family Psychiatric  History: Alcohol abuse Social  History:  Social History   Substance and Sexual Activity  Alcohol Use Yes   Comment: daily, 4 - 5 beers per day     Social History   Substance and Sexual Activity  Drug Use No    Social History   Socioeconomic History  . Marital status: Legally Separated    Spouse name: Not on file  . Number of children: 2  . Years of education: Not on file  . Highest education level: Not on file  Occupational History  . Not on file  Social Needs  . Financial resource strain: Not on file  . Food insecurity:    Worry: Not on file    Inability: Not on file  . Transportation needs:    Medical: Not on file    Non-medical: Not on file  Tobacco Use  . Smoking status: Never Smoker  . Smokeless tobacco: Never Used  Substance and Sexual Activity  . Alcohol use: Yes    Comment: daily, 4 - 5 beers per day  . Drug use: No  . Sexual activity: Not on file  Lifestyle  . Physical activity:    Days per week: Not on file    Minutes per session: Not on file  . Stress: Not on file  Relationships  . Social connections:    Talks on phone: Not on file    Gets together: Not on file    Attends religious service: Not on file    Active member of club or organization: Not on file    Attends meetings of clubs or organizations: Not on file    Relationship status: Not on file  Other Topics Concern  . Not on file  Social History Narrative   Lives with sister.  Operates heavy equipment.     Additional Social History:    Allergies:  No Known Allergies  Labs:  Results for orders placed or performed during the hospital encounter of 08/14/17 (from the past 48 hour(s))  Acetaminophen level     Status: Abnormal   Collection Time: 08/14/17  6:00 PM  Result Value Ref Range   Acetaminophen (Tylenol), Serum <10 (L) 10 - 30 ug/mL    Comment: (NOTE) Therapeutic concentrations vary significantly. A range of 10-30 ug/mL  may be an effective concentration for many patients. However, some  are best treated at  concentrations outside of this range. Acetaminophen concentrations >150 ug/mL at 4 hours after ingestion  and >50 ug/mL at 12 hours after ingestion are often associated with  toxic reactions. Performed at [facility], [address] [place], [place] [address]   Comprehensive metabolic panel     Status: Abnormal   Collection Time: 08/14/17  6:00 PM  Result Value Ref Range   Sodium 140 135 - 145 mmol/L   Potassium 3.7 3.5 - 5.1 mmol/L   Chloride 111 98 - 111 mmol/L   CO2 20 (L) 22 - 32 mmol/L   Glucose, Bld 122 (H) 70 - 99 mg/dL   BUN  16 8 - 23 mg/dL   Creatinine, Ser 0.94 0.61 - 1.24 mg/dL   Calcium 8.9 8.9 - 10.3 mg/dL   Total Protein 7.8 6.5 - 8.1 g/dL   Albumin 4.6 3.5 - 5.0 g/dL   AST 36 15 - 41 U/L   ALT 53 (H) 0 - 44 U/L   Alkaline Phosphatase 69 38 - 126 U/L   Total Bilirubin 0.5 0.3 - 1.2 mg/dL   GFR calc non Af Amer >60 >60 mL/min   GFR calc Af Amer >60 >60 mL/min    Comment: (NOTE) The eGFR has been calculated using the CKD EPI equation. This calculation has not been validated in all clinical situations. eGFR's persistently <60 mL/min signify possible Chronic Kidney Disease.    Anion gap 9 5 - 15    Comment: Performed at [facility], [address] [place], [place] [address]  Ethanol     Status: Abnormal   Collection Time: 08/14/17  6:00 PM  Result Value Ref Range   Alcohol, Ethyl (B) 139 (H) <10 mg/dL    Comment: (NOTE) Lowest detectable limit for serum alcohol is 10 mg/dL. For medical purposes only. Performed at [facility], [address] [place], [place] [address]   Lipase, blood     Status: Abnormal   Collection Time: 08/14/17  6:00 PM  Result Value Ref Range   Lipase 197 (H) 11 - 51 U/L    Comment: Performed at [facility], [address] [place], [place] [address]  Salicylate level     Status: None   Collection Time: 08/14/17  6:00 PM  Result Value Ref Range   Salicylate Lvl <2.0 2.8 - 30.0 mg/dL     Comment: Performed at [facility], [address] [place], [place] [address]  Troponin I     Status: None   Collection Time: 08/14/17  6:00 PM  Result Value Ref Range   Troponin I <0.03 <0.03 ng/mL    Comment: Performed at [facility], [address] [place], [place] [address]  CBC with Differential     Status: Abnormal   Collection Time: 08/14/17  6:00 PM  Result Value Ref Range   WBC 6.3 3.8 - 10.6 K/uL   RBC 4.37 (L) 4.40 - 5.90 MIL/uL   Hemoglobin 15.3 13.0 - 18.0 g/dL   HCT 43.2 40.0 - 52.0 %   MCV 98.8 80.0 - 100.0 fL   MCH 35.0 (H) 26.0 - 34.0 pg   MCHC 35.5 32.0 - 36.0 g/dL   RDW 14.0 11.5 - 14.5 %   Platelets 211 150 - 440 K/uL   Neutrophils Relative % 63 %   Neutro Abs 4.0 1.4 - 6.5 K/uL   Lymphocytes Relative 23 %   Lymphs Abs 1.4 1.0 - 3.6 K/uL   Monocytes Relative 12 %   Monocytes Absolute 0.7 0.2 - 1.0 K/uL   Eosinophils Relative 1 %   Eosinophils Absolute 0.1 0 - 0.7 K/uL   Basophils Relative 1 %   Basophils Absolute 0.0 0 - 0.1 K/uL    Comment: Performed at [facility], [address] [place], [place] [address]  Urinalysis, Complete w Microscopic     Status: Abnormal   Collection Time: 08/14/17  6:01 PM  Result Value Ref Range   Color, Urine AMBER (A) YELLOW    Comment: BIOCHEMICALS MAY BE AFFECTED BY COLOR   APPearance HAZY (A) CLEAR   Specific Gravity, Urine 1.021 1.005 - 1.030  pH 5.0 5.0 - 8.0   Glucose, UA NEGATIVE NEGATIVE mg/dL   Hgb urine dipstick NEGATIVE NEGATIVE   Bilirubin Urine NEGATIVE NEGATIVE   Ketones, ur NEGATIVE NEGATIVE mg/dL   Protein, ur NEGATIVE NEGATIVE mg/dL   Nitrite NEGATIVE NEGATIVE   Leukocytes, UA NEGATIVE NEGATIVE   RBC / HPF 0-5 0 - 5 RBC/hpf   WBC, UA 0-5 0 - 5 WBC/hpf   Bacteria, UA NONE SEEN NONE SEEN   Squamous Epithelial / LPF 0-5 0 - 5   Mucus PRESENT    Hyaline Casts, UA PRESENT     Comment: Performed at [facility], [address] [place], [place] [address]     No current facility-administered medications for this encounter.    Current Outpatient Medications  Medication Sig Dispense Refill  . acetaminophen (TYLENOL) 325 MG tablet Take 650 mg by mouth every 6 (six) hours as needed for moderate pain.    Marland Kitchen amLODipine (NORVASC) 2.5 MG tablet Take 1 tablet (2.5 mg total) by mouth daily. 30 tablet 3  . aspirin EC 325 MG tablet Take 1 tablet (325 mg total) by mouth daily. 100 tablet 3  . isosorbide mononitrate (IMDUR) 30 MG 24 hr tablet Take 1 tablet (30 mg total) by mouth daily. 30 tablet 11  . naproxen (NAPROSYN) 500 MG tablet Take 1 tablet (500 mg total) by mouth 2 (two) times daily with a meal. 30 tablet 0  . nitroGLYCERIN (NITROSTAT) 0.4 MG SL tablet Place 1 tablet (0.4 mg total) under the tongue every 5 (five) minutes as needed for chest pain. 25 tablet 3    Musculoskeletal: Strength & Muscle Tone: within normal limits Gait & Station: normal Patient leans: N/A  Psychiatric Specialty Exam: Physical Exam  Nursing note and vitals reviewed. Constitutional: He appears well-developed and well-nourished.  HENT:  Head: Normocephalic and atraumatic.  Eyes: Pupils are equal, round, and reactive to light. Conjunctivae are normal.  Neck: Normal range of motion.  Cardiovascular: Regular rhythm and normal heart sounds.  Respiratory: Effort normal. No respiratory distress.  GI: Soft.  Musculoskeletal: Normal range of motion.  Neurological: He is alert.  Skin: Skin is warm and dry.  Psychiatric: He has a normal mood and affect. His speech is normal and behavior is normal. His mood appears not anxious. Thought content is not paranoid. Cognition and memory are normal. He expresses impulsivity. He expresses no homicidal and no suicidal ideation.    Review of Systems  Constitutional: Negative.   HENT: Negative.   Eyes: Negative.   Respiratory: Negative.   Cardiovascular: Negative.   Gastrointestinal: Negative.   Musculoskeletal: Negative.   Skin:  Negative.   Neurological: Negative.   Psychiatric/Behavioral: Positive for substance abuse. Negative for depression, hallucinations, memory loss and suicidal ideas. The patient is nervous/anxious and has insomnia.     Blood pressure 134/85, pulse 74, height _0  (1.753 m), weight 127 kg (280 lb), SpO2 95 %.Body mass index is 41.35 kg/m.  General Appearance: Fairly Groomed  Eye Contact:  Good  Speech:  Clear and Coherent  Volume:  Normal  Mood:  Dysphoric  Affect:  Congruent  Thought Process:  Goal Directed  Orientation:  Full (Time, Place, and Person)  Thought Content:  Logical  Suicidal Thoughts:  No  Homicidal Thoughts:  No  Memory:  Immediate;   Fair Recent;   Fair Remote;   Fair  Judgement:  Impaired  Insight:  Shallow  Psychomotor Activity:  Decreased  Concentration:  Concentration: Fair  Recall:  [place] of Knowledge:  Fair  Language:  Fair  Akathisia:  No  Handed:  Right  AIMS (if indicated):     Assets:  Desire for Improvement Housing  ADL's:  Intact  Cognition:  WNL  Sleep:        Treatment Plan Summary: Plan Patient is a 62 year old man with a history of alcohol abuse.  Blood alcohol level 149 here in the emergency room.  Patient is sobering up and is calm and cooperative.  Denies any actual suicidal ideation.  Denies a syndrome of major depression.  Not psychotic.  Spent some time counseling the patient about the effect of alcohol on his mood.  Strongly encouraged him to consider getting involved with local mental health especially [facility] to try and stop drinking.  He is willing to consider it.  At this point I do not think the patient is acutely dangerous to himself and does not require inpatient hospitalization.  Case reviewed with emergency room doctor.  Discontinue the IV C.  Refer to [place].  Disposition: No evidence of imminent risk to self or others at present.   Patient does not meet criteria for psychiatric inpatient admission. Supportive therapy provided  about ongoing stressors. Discussed crisis plan, support from social network, calling 911, coming to the Emergency Department, and calling Suicide Hotline.  Alethia Berthold, MD 08/14/2017 7:25 PM

## 2017-08-14 NOTE — ED Provider Notes (Signed)
[facility] Emergency Department Provider Note  ____________________________________________   First MD Initiated Contact with Patient 08/14/17 1756     (approximate)  I have reviewed the triage vital signs and the nursing notes.   HISTORY  Chief Complaint Psychiatric Evaluation and Suicidal  Level 5 caveat:  history/ROS limited by acute intoxication  HPI Jason CoveFoster Rotondo Jr. is a 62 y.o. male with no contributory past medical history except for alcohol abuse who presents under involuntary commitment by law enforcement for expressing thoughts of self-harm.  He allegedly drank some household bleach after finding out that his niece's daughter is pregnant.  He reports that he was upset but does not want to kill himself now.  He has no thoughts of harming anyone else.  He says that nothing is hurting him, he is ambulatory without any difficulty, and he is agitated and just wants to go home.  He denies headache, sore throat, nausea, vomiting, abdominal pain, shortness of breath.  Everything happened acutely and was severe but other than his persistent agitation he says he feels fine.  He says he is only had 2 screwdrivers today instead of the large number of beers that he usually drinks per day.  Past Medical History:  Diagnosis Date  . Alcohol abuse    possibly as much as 24 beers per day  . Arthritis   . HTN (hypertension)     Patient Active Problem List   Diagnosis Date Noted  . Bleach ingestion 08/14/2017  . 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 Substance induced mood disorder (HCC) 08/14/2017  . Alcohol abuse 08/14/2017  . Unstable angina (HCC) 10/22/2016  . Hyperlipidemia 10/22/2016  . Medication management 10/22/2016    Past Surgical History:  Procedure Laterality Date  . HERNIA REPAIR     Right inguinal  . INTRAVASCULAR PRESSURE WIRE/FFR STUDY N/A 10/28/2016   Procedure: INTRAVASCULAR PRESSURE WIRE/FFR  STUDY;  Surgeon: Marykay LexHarding, David W, MD;  [facility];  Service: Cardiovascular;  Laterality: N/A;  . LEFT HEART CATH AND CORONARY ANGIOGRAPHY N/A 10/28/2016   Procedure: LEFT HEART CATH AND CORONARY ANGIOGRAPHY;  Surgeon: Marykay LexHarding, David W, MD;  [facility];  Service: Cardiovascular;  Laterality: N/A;    Prior to Admission medications   Medication Sig Start Date End Date Taking? Authorizing Provider  acetaminophen (TYLENOL) 325 MG tablet Take 650 mg by mouth every 6 (six) hours as needed for moderate pain.    [provider]  amLODipine (NORVASC) 2.5 MG tablet Take 1 tablet (2.5 mg total) by mouth daily. 10/29/16   Marykay LexHarding, David W, MD  aspirin EC 325 MG tablet Take 1 tablet (325 mg total) by mouth daily. 09/29/16 09/29/17  Willy Eddyobinson, Patrick, MD  isosorbide mononitrate (IMDUR) 30 MG 24 hr tablet Take 1 tablet (30 mg total) by mouth daily. 09/29/16 09/29/17  Willy Eddyobinson, Patrick, MD  naproxen (NAPROSYN) 500 MG tablet Take 1 tablet (500 mg total) by mouth 2 (two) times daily with a meal. 07/20/16   Tommi RumpsSummers, Rhonda L, PA-C  nitroGLYCERIN (NITROSTAT) 0.4 MG SL tablet Place 1 tablet (0.4 mg total) under the tongue every 5 (five) minutes as needed for chest pain. 10/21/16 01/19/17  Rollene RotundaHochrein, James, MD    Allergies Patient has no known allergies.  Family History  Problem Relation Age of Onset  . Cancer Mother   . Cancer Father     Social History Social History   Tobacco Use  . Smoking  status: Never Smoker  . Smokeless tobacco: Never Used  Substance Use Topics  . Alcohol use: Yes    Comment: daily, 4 - 5 beers per day  . Drug use: No    Review of Systems Level 5 caveat:  history/ROS limited by acute intoxication  Constitutional: No fever/chills Eyes: No visual changes. ENT: No sore throat. Cardiovascular: Denies chest pain. Respiratory: Denies shortness of breath. Gastrointestinal: No abdominal pain.  No nausea, no vomiting.  No diarrhea.  No  constipation. Genitourinary: Negative for dysuria. Musculoskeletal: Negative for neck pain.  Negative for back pain. Integumentary: Negative for rash. Neurological: Negative for headaches, focal weakness or numbness. Psychiatric:Erratic behavior, possible self-harm and possible suicidal ideation  ____________________________________________   PHYSICAL EXAM:  VITAL SIGNS: ED Triage Vitals  Enc Vitals Group     BP 08/14/17 1805 134/85     Pulse Rate 08/14/17 1805 74     Resp --      Temp --      Temp src --      SpO2 08/14/17 1805 95 %     Weight 08/14/17 1809 127 kg (280 lb)     Height 08/14/17 1809 1.753 m (5\' 9" )     Head Circumference --      Peak Flow --      Pain Score 08/14/17 1830 6     Pain Loc --      Pain Edu? --      Excl. in GC? --     Constitutional: Alert and oriented. Well appearing and in no acute distress. Eyes: Conjunctivae are normal.  Head: Atraumatic. Nose: No congestion/rhinnorhea. Neck: No stridor.  No meningeal signs.   Cardiovascular: Normal rate, regular rhythm. Good peripheral circulation. Grossly normal heart sounds. Respiratory: Normal respiratory effort.  No retractions. Lungs CTAB. Gastrointestinal: Soft and nontender. No distention.  Musculoskeletal: No lower extremity tenderness nor edema. No gross deformities of extremities. Neurologic:  Normal speech and language. No gross focal neurologic deficits are appreciated.  Skin:  Skin is warm, dry and intact. No rash noted. Psychiatric: Mood and affect are agitated and belligerent but able to be redirected.  Denies SI and HI, admits to drinking some bleach.  ____________________________________________   LABS (all labs ordered are listed, but only abnormal results are displayed)  Labs Reviewed  ACETAMINOPHEN LEVEL - Abnormal; Notable for the following components:      Result Value   Acetaminophen (Tylenol), Serum <10 (*)    All other components within normal limits  COMPREHENSIVE  METABOLIC PANEL - Abnormal; Notable for the following components:   CO2 20 (*)    Glucose, Bld 122 (*)    ALT 53 (*)    All other components within normal limits  ETHANOL - Abnormal; Notable for the following components:   Alcohol, Ethyl (B) 139 (*)    All other components within normal limits  LIPASE, BLOOD - Abnormal; Notable for the following components:   Lipase 197 (*)    All other components within normal limits  CBC WITH DIFFERENTIAL/PLATELET - Abnormal; Notable for the following components:   RBC 4.37 (*)    MCH 35.0 (*)    All other components within normal limits  URINALYSIS, COMPLETE (UACMP) WITH MICROSCOPIC - Abnormal; Notable for the following components:   Color, Urine AMBER (*)    APPearance HAZY (*)    All other components within normal limits  SALICYLATE LEVEL  TROPONIN I  URINE DRUG SCREEN, QUALITATIVE ([facility] ONLY)   ____________________________________________  EKG  None - EKG not ordered by ED physician ____________________________________________  RADIOLOGY   ED MD interpretation: No indication for imaging  Official radiology report(s): No results found.  ____________________________________________   PROCEDURES  Critical Care performed: No   Procedure(s) performed:   Procedures   ____________________________________________   INITIAL IMPRESSION / ASSESSMENT AND PLAN / ED COURSE  As part of my medical decision making, I reviewed the following data within the electronic MEDICAL RECORD NUMBER Nursing notes reviewed and incorporated, Labs reviewed , Old chart reviewed, A consult was requested and obtained from this/these consultant(s) Psychiatry and Notes from prior ED visits   Differential diagnosis includes, but is not limited to, adjustment disorder, substance-induced mood disorder, other nonspecific psychiatric illness including true depression with or without suicidal ideation.  Regarding the bleach ingestion, ingestion of a small amount of  household bleach is nontoxic and there is no indication for any intervention given that he has no pain or discomfort, is ambulatory, and is eating and drinking without difficulty.  I gave Ativan 2 mg by mouth for possible alcohol withdrawal given the decreased amount of alcohol he has had today as well as for his agitation which he willingly took.  Labs are within normal limits except for an alcohol level of 139 and a lipase of 197 of unknown significance but likely related to his chronic alcohol abuse.  The patient has no abdominal pain, is eating and drinking without difficulty, and I have recommended that he follow-up with GI but there is no indication for emergent work-up at this time.  Dr. Toni Amend evaluated the patient in person and released him from involuntary commitment.  The patient was calm and cooperative for him and Dr. Toni Amend is no concerned about his safety.  He feels he is ready to go if a sober relative or friend can come pick him up.  No indication for further medical nor psychiatric management at this time.      ____________________________________________  FINAL CLINICAL IMPRESSION(S) / ED DIAGNOSES  Final diagnoses:  Alcoholic intoxication with complication (HCC)  Ingestion of bleach, intentional self-harm, initial encounter (HCC)  Elevated lipase     MEDICATIONS GIVEN DURING THIS VISIT:  Medications  LORazepam (ATIVAN) tablet 2 mg (2 mg Oral Given 08/14/17 1802)     ED Discharge Orders    None       Note:  This document was prepared using Dragon voice recognition software and may include unintentional dictation errors.    Loleta Rose, MD 08/14/17 [phone]

## 2017-08-14 NOTE — ED Triage Notes (Signed)
He arrives today via [employer] officers from home  Pt reportedly drank half a bottle of bleach in an attempt to harm himself  Pt verbalizes family problems is why he drank the chlorox  Agitated upon arrival  He currently drinks 24 beers daily but reports only drinking two canned screwdrivers today

## 2017-08-14 NOTE — ED Notes (Signed)
Pt given dinner tray and ate about 75% of it with grape juice. No n/v at this time.

## 2017-08-14 NOTE — ED Notes (Signed)
Pt. Alert and oriented, warm and dry, in no distress. Pt. Denies SI, HI, and AVH.Reviewed discharge paperwork. Patient verbally stated understanding of discharge paperwork. Belongings given to patient to dress out of behavioral scrubs.

## 2018-03-13 ENCOUNTER — Emergency Department
Admission: EM | Admit: 2018-03-13 | Discharge: 2018-03-13 | Disposition: A | Discharge status: Home / Self Care | Payer: Self-pay | Attending: Emergency Medicine | Admitting: Emergency Medicine

## 2018-03-13 ENCOUNTER — Encounter: Payer: Self-pay | Admitting: Emergency Medicine

## 2018-03-13 ENCOUNTER — Other Ambulatory Visit: Payer: Self-pay

## 2018-03-13 DIAGNOSIS — J40 Bronchitis, not specified as acute or chronic: Secondary | ICD-10-CM

## 2018-03-13 DIAGNOSIS — I10 Essential (primary) hypertension: Secondary | ICD-10-CM | POA: Insufficient documentation

## 2018-03-13 MED ORDER — HYDROCOD POLST-CPM POLST ER 10-8 MG/5ML PO SUER
5.0000 mL | Freq: Once | ORAL | Status: AC
Start: 2018-03-13 — End: 2018-03-13
  Administered 2018-03-13: 5 mL via ORAL
  Filled 2018-03-13: qty 5

## 2018-03-13 MED ORDER — AZITHROMYCIN 250 MG PO TABS: ORAL_TABLET | ORAL | 0 refills | Status: AC

## 2018-03-13 MED ORDER — AZITHROMYCIN 500 MG PO TABS
500.0000 mg | ORAL_TABLET | Freq: Once | ORAL | Status: AC
  Administered 2018-03-13: 500 mg via ORAL
  Filled 2018-03-13: qty 1

## 2018-03-13 MED ORDER — PROMETHAZINE-CODEINE 6.25-10 MG/5ML PO SYRP: 5.0000 mL | ORAL_SOLUTION | Freq: Four times a day (QID) | ORAL | 0 refills | Status: DC | PRN

## 2018-03-13 NOTE — Discharge Instructions (Signed)
Follow discharge care instruction take medication as directed. °

## 2018-03-13 NOTE — ED Provider Notes (Signed)
[facility] Emergency Department Provider Note   ____________________________________________   First MD Initiated Contact with Patient 03/13/18 1117     (approximate)  I have reviewed the triage vital signs and the nursing notes.   HISTORY  Chief Complaint URI    HPI Jason Wiggins. is a 63 y.o. male patient presents with worsening cough, nasal and chest congestion for 1 week.  Patient denies nausea, vomiting, diarrhea.  Patient unsure of fever.  No palliative measure for complaint.         Past Medical History:  Diagnosis Date  . Alcohol abuse    possibly as much as 24 beers per day  . Arthritis   . HTN (hypertension)     Patient Active Problem List   Diagnosis Date Noted  . Bleach ingestion 08/14/2017  . 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 Substance induced mood disorder (HCC) 08/14/2017  . Alcohol abuse 08/14/2017  . Unstable angina (HCC) 10/22/2016  . Hyperlipidemia 10/22/2016  . Medication management 10/22/2016    Past Surgical History:  Procedure Laterality Date  . HERNIA REPAIR     Right inguinal  . INTRAVASCULAR PRESSURE WIRE/FFR STUDY N/A 10/28/2016   Procedure: INTRAVASCULAR PRESSURE WIRE/FFR STUDY;  Surgeon: Marykay Lex, MD;  Location: [facility] INVASIVE CV LAB;  Service: Cardiovascular;  Laterality: N/A;  . LEFT HEART CATH AND CORONARY ANGIOGRAPHY N/A 10/28/2016   Procedure: LEFT HEART CATH AND CORONARY ANGIOGRAPHY;  Surgeon: Marykay Lex, MD;  Location: [facility] INVASIVE CV LAB;  Service: Cardiovascular;  Laterality: N/A;    Prior to Admission medications   Medication Sig Start Date End Date Taking? Authorizing Provider  azithromycin (ZITHROMAX Z-PAK) 250 MG tablet Take 2 tablets (500 mg) on  Day 1,  followed by 1 tablet (250 mg) once daily on Days 2 through 5. 03/13/18 03/18/18  Joni Reining, PA-C  nitroGLYCERIN (NITROSTAT) 0.4 MG SL tablet Place 1 tablet (0.4 mg total) under the  tongue every 5 (five) minutes as needed for chest pain. 10/21/16 01/19/17  Rollene Rotunda, MD  promethazine-codeine (PHENERGAN WITH CODEINE) 6.25-10 MG/5ML syrup Take 5 mLs by mouth every 6 (six) hours as needed for cough. 03/13/18   Joni Reining, PA-C    Allergies Patient has no known allergies.  Family History  Problem Relation Age of Onset  . Cancer Mother   . Cancer Father     Social History Social History   Tobacco Use  . Smoking status: Never Smoker  . Smokeless tobacco: Never Used  Substance Use Topics  . Alcohol use: Yes    Comment: daily, 4 - 5 beers per day  . Drug use: No    Review of Systems Constitutional: No fever/chills Eyes: No visual changes. ENT: No sore throat.  Nasal congestion. Cardiovascular: Denies chest pain. Respiratory: Denies shortness of breath.  Cough with deep inspirations. Gastrointestinal: No abdominal pain.  No nausea, no vomiting.  No diarrhea.  No constipation. Genitourinary: Negative for dysuria. Musculoskeletal: Negative for back pain. Skin: Negative for rash. Neurological: Negative for headaches, focal weakness or numbness.   ____________________________________________   PHYSICAL EXAM:  VITAL SIGNS: ED Triage Vitals  Enc Vitals Group     BP 03/13/18 1058 139/90     Pulse Rate 03/13/18 1058 100     Resp 03/13/18 1058 20     Temp 03/13/18 1058 98.6 F (37 C)     Temp Source 03/13/18 1058 Oral     SpO2  03/13/18 1058 95 %     Weight 03/13/18 1054 240 lb (108.9 kg)     Height 03/13/18 1054 6\' 1"  (1.854 m)     Head Circumference --      Peak Flow --      Pain Score 03/13/18 1053 5     Pain Loc --      Pain Edu? --      Excl. in GC? --    Constitutional: Alert and oriented. Well appearing and in no acute distress. Nose: Edematous nasal turbinates thick rhinorrhea. Mouth/Throat: Mucous membranes are moist.  Oropharynx non-erythematous.  Postnasal drainage. Neck: No stridor.  Hematological/Lymphatic/Immunilogical: No  cervical lymphadenopathy. Cardiovascular: Normal rate, regular rhythm. Grossly normal heart sounds.  Good peripheral circulation. Respiratory: Normal respiratory effort.  No retractions. Lungs CTAB.  Cough with deep inspirations. Gastrointestinal: Soft and nontender. No distention. No abdominal bruits. No CVA tenderness. Skin:  Skin is warm, dry and intact. No rash noted.  ____________________________________________   LABS (all labs ordered are listed, but only abnormal results are displayed)  Labs Reviewed - No data to display ____________________________________________  EKG   ____________________________________________  RADIOLOGY  ED MD interpretation:    Official radiology report(s): No results found.  ____________________________________________   PROCEDURES  Procedure(s) performed (including Critical Care):  Procedures   ____________________________________________   INITIAL IMPRESSION / ASSESSMENT AND PLAN / ED COURSE  As part of my medical decision making, I reviewed the following data within the electronic MEDICAL RECORD NUMBER         Cough and congestion secondary bronchitis.  Patient given discharge care instruction advised take medication as directed.  [facility]      ____________________________________________   FINAL CLINICAL IMPRESSION(S) / ED DIAGNOSES  Final diagnoses:  Bronchitis     ED Discharge Orders         Ordered    azithromycin (ZITHROMAX Z-PAK) 250 MG tablet     03/13/18 1252    promethazine-codeine (PHENERGAN WITH CODEINE) 6.25-10 MG/5ML syrup  Every 6 hours PRN     03/13/18 1252           Note:  This document was prepared using Dragon voice recognition software and may include unintentional dictation errors.    Joni Reining, PA-C 03/13/18 1257    Sharman Cheek, MD 03/16/18 2139

## 2018-03-13 NOTE — ED Notes (Signed)
See triage note   Presents with some chest discomfort and prod cough  States sxs' started about 1 week ago   Afebrile on arrival

## 2018-03-13 NOTE — ED Triage Notes (Signed)
Pt in via POV, reports worsening cough and congestion x one week.  Ambulatory to triage, NAD noted at this time.

## 2018-03-21 ENCOUNTER — Other Ambulatory Visit: Payer: Self-pay

## 2018-03-21 ENCOUNTER — Encounter: Payer: Self-pay | Admitting: Emergency Medicine

## 2018-03-21 ENCOUNTER — Emergency Department: Payer: Self-pay

## 2018-03-21 ENCOUNTER — Inpatient Hospital Stay
Admission: EM | Admit: 2018-03-21 | Discharge: 2018-03-23 | DRG: 871 | Disposition: A | Discharge status: Home / Self Care | Payer: Self-pay | Attending: Internal Medicine | Admitting: Internal Medicine

## 2018-03-21 DIAGNOSIS — J9601 Acute respiratory failure with hypoxia: Secondary | ICD-10-CM | POA: Diagnosis present

## 2018-03-21 DIAGNOSIS — J189 Pneumonia, unspecified organism: Secondary | ICD-10-CM | POA: Diagnosis present

## 2018-03-21 DIAGNOSIS — F1010 Alcohol abuse, uncomplicated: Secondary | ICD-10-CM | POA: Diagnosis present

## 2018-03-21 DIAGNOSIS — I10 Essential (primary) hypertension: Secondary | ICD-10-CM | POA: Diagnosis present

## 2018-03-21 DIAGNOSIS — A419 Sepsis, unspecified organism: Principal | ICD-10-CM | POA: Diagnosis present

## 2018-03-21 DIAGNOSIS — R0902 Hypoxemia: Secondary | ICD-10-CM | POA: Diagnosis present

## 2018-03-21 LAB — CBC
HCT: 39.5 % (ref 39.0–52.0)
HEMATOCRIT: 35.9 % — AB (ref 39.0–52.0)
HEMOGLOBIN: 14.2 g/dL (ref 13.0–17.0)
Hemoglobin: 12.6 g/dL — ABNORMAL LOW (ref 13.0–17.0)
MCH: 33.6 pg (ref 26.0–34.0)
MCH: 32.9 pg (ref 26.0–34.0)
MCHC: 35.9 g/dL (ref 30.0–36.0)
MCHC: 35.1 g/dL (ref 30.0–36.0)
MCV: 93.4 fL (ref 80.0–100.0)
MCV: 93.7 fL (ref 80.0–100.0)
PLATELETS: 278 10*3/uL (ref 150–400)
Platelets: 249 10*3/uL (ref 150–400)
RBC: 4.23 MIL/uL (ref 4.22–5.81)
RBC: 3.83 MIL/uL — ABNORMAL LOW (ref 4.22–5.81)
RDW: 12.5 % (ref 11.5–15.5)
RDW: 12.5 % (ref 11.5–15.5)
WBC: 6.2 10*3/uL (ref 4.0–10.5)
WBC: 6.4 10*3/uL (ref 4.0–10.5)
nRBC: 0 % (ref 0.0–0.2)
nRBC: 0 % (ref 0.0–0.2)

## 2018-03-21 LAB — BASIC METABOLIC PANEL
ANION GAP: 11 (ref 5–15)
BUN: 12 mg/dL (ref 8–23)
CALCIUM: 8.7 mg/dL — AB (ref 8.9–10.3)
CHLORIDE: 100 mmol/L (ref 98–111)
CO2: 27 mmol/L (ref 22–32)
Creatinine, Ser: 0.7 mg/dL (ref 0.61–1.24)
Glucose, Bld: 115 mg/dL — ABNORMAL HIGH (ref 70–99)
POTASSIUM: 3.6 mmol/L (ref 3.5–5.1)
SODIUM: 138 mmol/L (ref 135–145)

## 2018-03-21 LAB — CREATININE, SERUM
Creatinine, Ser: 0.75 mg/dL (ref 0.61–1.24)
GFR calc Af Amer: >60 mL/min (ref >60)
GFR calc non Af Amer: >60 mL/min (ref >60)

## 2018-03-21 LAB — TROPONIN I

## 2018-03-21 LAB — INFLUENZA PANEL BY PCR (TYPE A & B)
Influenza A By PCR: NEGATIVE
Influenza B By PCR: NEGATIVE

## 2018-03-21 LAB — ETHANOL

## 2018-03-21 MED ORDER — ACETAMINOPHEN 650 MG RE SUPP: 650.0000 mg | Freq: Four times a day (QID) | RECTAL | Status: DC | PRN

## 2018-03-21 MED ORDER — ACETAMINOPHEN 325 MG PO TABS: 650.0000 mg | ORAL_TABLET | Freq: Four times a day (QID) | ORAL | Status: DC | PRN

## 2018-03-21 MED ORDER — ENOXAPARIN SODIUM 40 MG/0.4ML ~~LOC~~ SOLN
40.0000 mg | SUBCUTANEOUS | Status: DC
  Administered 2018-03-21 – 2018-03-22 (×2): 40 mg via SUBCUTANEOUS
  Filled 2018-03-21 (×2): qty 0.4

## 2018-03-21 MED ORDER — ONDANSETRON HCL 4 MG PO TABS: 4.0000 mg | ORAL_TABLET | Freq: Four times a day (QID) | ORAL | Status: DC | PRN

## 2018-03-21 MED ORDER — SODIUM CHLORIDE 0.9 % IV SOLN
INTRAVENOUS | Status: DC
  Administered 2018-03-21 – 2018-03-23 (×3): via INTRAVENOUS

## 2018-03-21 MED ORDER — THIAMINE HCL 100 MG/ML IJ SOLN
100.0000 mg | Freq: Every day | INTRAMUSCULAR | Status: DC
  Administered 2018-03-22: 100 mg via INTRAVENOUS
  Filled 2018-03-21: qty 2

## 2018-03-21 MED ORDER — LORAZEPAM 2 MG PO TABS: 0.0000 mg | ORAL_TABLET | Freq: Four times a day (QID) | ORAL | Status: DC

## 2018-03-21 MED ORDER — ADULT MULTIVITAMIN W/MINERALS CH
1.0000 | ORAL_TABLET | Freq: Every day | ORAL | Status: DC
  Administered 2018-03-22 – 2018-03-23 (×2): 1 via ORAL
  Filled 2018-03-21 (×2): qty 1

## 2018-03-21 MED ORDER — SODIUM CHLORIDE 0.9 % IV SOLN
1.0000 g | INTRAVENOUS | Status: DC
  Administered 2018-03-22: 1 g via INTRAVENOUS
  Filled 2018-03-21: qty 10
  Filled 2018-03-21: qty 1

## 2018-03-21 MED ORDER — ONDANSETRON HCL 4 MG/2ML IJ SOLN: 4.0000 mg | Freq: Four times a day (QID) | INTRAMUSCULAR | Status: DC | PRN

## 2018-03-21 MED ORDER — IPRATROPIUM-ALBUTEROL 0.5-2.5 (3) MG/3ML IN SOLN
3.0000 mL | Freq: Once | RESPIRATORY_TRACT | Status: AC
  Administered 2018-03-21: 3 mL via RESPIRATORY_TRACT
  Filled 2018-03-21: qty 3

## 2018-03-21 MED ORDER — FOLIC ACID 1 MG PO TABS
1.0000 mg | ORAL_TABLET | Freq: Every day | ORAL | Status: DC
  Administered 2018-03-22 – 2018-03-23 (×2): 1 mg via ORAL
  Filled 2018-03-21 (×2): qty 1

## 2018-03-21 MED ORDER — HYDROCODONE-ACETAMINOPHEN 5-325 MG PO TABS: 1.0000 | ORAL_TABLET | ORAL | Status: DC | PRN

## 2018-03-21 MED ORDER — SODIUM CHLORIDE 0.9 % IV SOLN
500.0000 mg | INTRAVENOUS | Status: DC
  Administered 2018-03-22: 500 mg via INTRAVENOUS
  Filled 2018-03-21 (×4): qty 500

## 2018-03-21 MED ORDER — LORAZEPAM 1 MG PO TABS: 1.0000 mg | ORAL_TABLET | Freq: Four times a day (QID) | ORAL | Status: DC | PRN

## 2018-03-21 MED ORDER — POLYETHYLENE GLYCOL 3350 17 G PO PACK: 17.0000 g | PACK | Freq: Every day | ORAL | Status: DC | PRN

## 2018-03-21 MED ORDER — SODIUM CHLORIDE 0.9 % IV BOLUS
1000.0000 mL | Freq: Once | INTRAVENOUS | Status: AC
  Administered 2018-03-21: 1000 mL via INTRAVENOUS

## 2018-03-21 MED ORDER — VITAMIN B-1 100 MG PO TABS
100.0000 mg | ORAL_TABLET | Freq: Every day | ORAL | Status: DC
  Administered 2018-03-21 – 2018-03-23 (×2): 100 mg via ORAL
  Filled 2018-03-21 (×2): qty 1

## 2018-03-21 MED ORDER — SODIUM CHLORIDE 0.9 % IV SOLN
500.0000 mg | INTRAVENOUS | Status: DC
  Filled 2018-03-21: qty 500

## 2018-03-21 MED ORDER — SODIUM CHLORIDE 0.9 % IV SOLN
2.0000 g | INTRAVENOUS | Status: DC
  Administered 2018-03-21: 2 g via INTRAVENOUS
  Filled 2018-03-21: qty 20

## 2018-03-21 MED ORDER — LORAZEPAM 2 MG PO TABS: 0.0000 mg | ORAL_TABLET | Freq: Two times a day (BID) | ORAL | Status: DC

## 2018-03-21 MED ORDER — LORAZEPAM 2 MG/ML IJ SOLN: 1.0000 mg | Freq: Four times a day (QID) | INTRAMUSCULAR | Status: DC | PRN

## 2018-03-21 MED ORDER — ALBUTEROL SULFATE (2.5 MG/3ML) 0.083% IN NEBU
5.0000 mg | INHALATION_SOLUTION | Freq: Once | RESPIRATORY_TRACT | Status: AC
  Administered 2018-03-21: 5 mg via RESPIRATORY_TRACT
  Filled 2018-03-21: qty 6

## 2018-03-21 NOTE — H&P (Signed)
[facility] [place] [facility]   PATIENT NAME: Jason Wiggins    MR#:  [id number]  DATE OF BIRTH:  05/11/1955  DATE OF ADMISSION:  03/21/2018  PRIMARY CARE PHYSICIAN: Patient, No Pcp Per   REQUESTING/REFERRING PHYSICIAN: dr Marisa Severin CHIEF COMPLAINT:   SOB HISTORY OF PRESENT ILLNESS:  Jason Wiggins  is a 63 y.o. male with a known history of Etoh abuse who presents today to the emergency room due to shortness of breath.He has generalized weakness and malaise.  Denies any fevers.  He does have a productive cough. No recent travel history.  No body aches.  Patient was seen in the emergency room about 8 days ago and diagnosed with bronchitis and discharged with cough syrup.  His symptoms have worsened so he comes to the ER for further evaluation. Patient was hypoxic upon arrival with O2 saturation of 83% on room air.  He was placed on 4 L of nasal cannula.  He is currently on 3 L nasal cannula.  CXR shows multifocal pneumonia.  He has been started on Rocephin and azithromycin.   Last Etoh drink was 3 weeks ago.  PAST MEDICAL HISTORY:   Past Medical History:  Diagnosis Date  . Alcohol abuse    possibly as much as 24 beers per day  . Arthritis   . HTN (hypertension)     PAST SURGICAL HISTORY:   Past Surgical History:  Procedure Laterality Date  . HERNIA REPAIR     Right inguinal  . INTRAVASCULAR PRESSURE WIRE/FFR STUDY N/A 10/28/2016   Procedure: INTRAVASCULAR PRESSURE WIRE/FFR STUDY;  Surgeon: Marykay Lex, MD;  Location: [facility] INVASIVE CV LAB;  Service: Cardiovascular;  Laterality: N/A;  . LEFT HEART CATH AND CORONARY ANGIOGRAPHY N/A 10/28/2016   Procedure: LEFT HEART CATH AND CORONARY ANGIOGRAPHY;  Surgeon: Marykay Lex, MD;  Location: [facility] INVASIVE CV LAB;  Service: Cardiovascular;  Laterality: N/A;    SOCIAL HISTORY:   Social History   Tobacco Use  . Smoking status: Never Smoker  . Smokeless tobacco: Never Used  Substance Use Topics  . Alcohol  use: Yes    Comment: daily, 4 - 5 beers per day    FAMILY HISTORY:   Family History  Problem Relation Age of Onset  . Cancer Mother   . Cancer Father     DRUG ALLERGIES:  No Known Allergies  REVIEW OF SYSTEMS:   Review of Systems  Constitutional: Negative.  Negative for chills, fever and malaise/fatigue.  HENT: Negative.  Negative for ear discharge, ear pain, hearing loss, nosebleeds and sore throat.   Eyes: Negative.  Negative for blurred vision and pain.  Respiratory: Positive for cough and shortness of breath. Negative for hemoptysis and wheezing.   Cardiovascular: Negative.  Negative for chest pain, palpitations and leg swelling.  Gastrointestinal: Positive for nausea and vomiting. Negative for abdominal pain, blood in stool and diarrhea.  Genitourinary: Negative.  Negative for dysuria.  Musculoskeletal: Negative.  Negative for back pain.  Skin: Negative.   Neurological: Negative for dizziness, tremors, speech change, focal weakness, seizures and headaches.  Endo/Heme/Allergies: Negative.  Does not bruise/bleed easily.  Psychiatric/Behavioral: Negative.  Negative for depression, hallucinations and suicidal ideas.    MEDICATIONS AT HOME:   Prior to Admission medications   Medication Sig Start Date End Date Taking? Authorizing Provider  nitroGLYCERIN (NITROSTAT) 0.4 MG SL tablet Place 1 tablet (0.4 mg total) under the tongue every 5 (five) minutes as needed for chest pain. 10/21/16 03/21/18 Yes Hochrein,  Fayrene Fearing, MD      VITAL SIGNS:  Blood pressure 138/77, pulse (!) 104, temperature 98 F (36.7 C), temperature source Oral, resp. rate (!) 45, height 6\' 1"  (1.854 m), weight 108.9 kg, SpO2 (!) 89 %.  PHYSICAL EXAMINATION:   Physical Exam Constitutional:      General: He is not in acute distress. HENT:     Head: Normocephalic.  Eyes:     General: No scleral icterus. Neck:     Musculoskeletal: Normal range of motion and neck supple.     Vascular: No JVD.      Trachea: No tracheal deviation.  Cardiovascular:     Rate and Rhythm: Regular rhythm. Tachycardia present.     Heart sounds: Normal heart sounds. No murmur. No friction rub. No gallop.   Pulmonary:     Effort: Pulmonary effort is normal. No respiratory distress.     Breath sounds: Normal breath sounds. No wheezing or rales.  Chest:     Chest wall: No tenderness.  Abdominal:     General: Bowel sounds are normal. There is no distension.     Palpations: Abdomen is soft. There is no mass.     Tenderness: There is no abdominal tenderness. There is no guarding or rebound.  Musculoskeletal: Normal range of motion.  Skin:    General: Skin is warm.     Findings: No erythema or rash.  Neurological:     General: No focal deficit present.     Mental Status: He is alert and oriented to person, place, and time. Mental status is at baseline.  Psychiatric:        Mood and Affect: Mood normal.        Behavior: Behavior normal.        Thought Content: Thought content normal.        Judgment: Judgment normal.       LABORATORY PANEL:   CBC Recent Labs  Lab 03/21/18 1813  WBC 6.2  HGB 14.2  HCT 39.5  PLT 278   ------------------------------------------------------------------------------------------------------------------  Chemistries  Recent Labs  Lab 03/21/18 1813  NA 138  K 3.6  CL 100  CO2 27  GLUCOSE 115*  BUN 12  CREATININE 0.70  CALCIUM 8.7*   ------------------------------------------------------------------------------------------------------------------  Cardiac Enzymes Recent Labs  Lab 03/21/18 1844  TROPONINI <0.03   ------------------------------------------------------------------------------------------------------------------  RADIOLOGY:  Dg Chest 2 View  Result Date: 03/21/2018 CLINICAL DATA:  Cough, shortness of breath, left chest pain EXAM: CHEST - 2 VIEW COMPARISON:  CTA chest dated 09/29/2016 FINDINGS: Multifocal patchy/interstitial opacities in  the right upper lobe, lingula, and bilateral lower lobes, suspicious for multifocal pneumonia. No pleural effusion or pneumothorax. The heart is normal in size. IMPRESSION: Multifocal patchy/interstitial opacities, suspicious for multifocal pneumonia. Electronically Signed   By: Charline Bills M.D.   On: 03/21/2018 18:11    EKG:  Normal sinus rhythm no ST elevation or depression  Left anterior fascicular block IMPRESSION AND PLAN:    63 year old male with history of EtOH abuse who presents with shortness of breath and cough.  1.  Acute hypoxic respiratory failure: This is due to community-acquired p multifocal neumonia Wean oxygen to room air as tolerated  2.  Sepsis due to multifocal pneumonia: Patient presents with tachycardia and tachypnea . continue Rocephin and azithromycin as ordered by ED physician. Follow-up on blood cultures  3.  EtOH abuse: CIWA protocol   All the records are reviewed and case discussed with ED provider. Management plans discussed with the patient  and he is in agreement  CODE STATUS: full  TOTAL TIME TAKING CARE OF THIS PATIENT: 41 minutes.    Catelin Manthe M.D on 03/21/2018 at 7:36 PM  Between 7am to 6pm - Pager - [phone]  After 6pm go to [email or web address] - password [email or web address]  [facility]  Office  [phone]  CC: Primary care physician; Patient, No Pcp Per

## 2018-03-21 NOTE — ED Provider Notes (Signed)
[facility] Emergency Department Provider Note ____________________________________________   First MD Initiated Contact with Patient 03/21/18 1749     (approximate)  I have reviewed the triage vital signs and the nursing notes.   HISTORY  Chief Complaint Bronchitis    HPI Jason Wiggins. is a 63 y.o. male with PMH as noted below who presents with shortness of breath, gradual onset, persistent course over the last week, associated with cough as well as vomiting and diarrhea.  The patient reports generalized weakness and malaise which is also been worsening over the last week.  The patient was seen in the ED 8 days ago and diagnosed with presumed bronchitis.  He denies sick contacts or travel.  Past Medical History:  Diagnosis Date  . Alcohol abuse    possibly as much as 24 beers per day  . Arthritis   . HTN (hypertension)     Patient Active Problem List   Diagnosis Date Noted  . Hypoxia 03/21/2018  . Bleach ingestion 08/14/2017  . 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 Substance induced mood disorder (HCC) 08/14/2017  . Alcohol abuse 08/14/2017  . Unstable angina (HCC) 10/22/2016  . Hyperlipidemia 10/22/2016  . Medication management 10/22/2016    Past Surgical History:  Procedure Laterality Date  . HERNIA REPAIR     Right inguinal  . INTRAVASCULAR PRESSURE WIRE/FFR STUDY N/A 10/28/2016   Procedure: INTRAVASCULAR PRESSURE WIRE/FFR STUDY;  Surgeon: Marykay Lex, MD;  Location: [facility] INVASIVE CV LAB;  Service: Cardiovascular;  Laterality: N/A;  . LEFT HEART CATH AND CORONARY ANGIOGRAPHY N/A 10/28/2016   Procedure: LEFT HEART CATH AND CORONARY ANGIOGRAPHY;  Surgeon: Marykay Lex, MD;  Location: [facility] INVASIVE CV LAB;  Service: Cardiovascular;  Laterality: N/A;    Prior to Admission medications   Medication Sig Start Date End Date Taking? Authorizing Provider  nitroGLYCERIN (NITROSTAT) 0.4 MG SL tablet  Place 1 tablet (0.4 mg total) under the tongue every 5 (five) minutes as needed for chest pain. 10/21/16 03/21/18 Yes Rollene Rotunda, MD    Allergies Patient has no known allergies.  Family History  Problem Relation Age of Onset  . Cancer Mother   . Cancer Father     Social History Social History   Tobacco Use  . Smoking status: Never Smoker  . Smokeless tobacco: Never Used  Substance Use Topics  . Alcohol use: Yes    Comment: daily, 4 - 5 beers per day  . Drug use: No    Review of Systems  Constitutional: Positive for chills. Eyes: No redness. ENT: No nasal congestion. Cardiovascular: Positive for chest pain. Respiratory: Positive for shortness of breath. Gastrointestinal: Positive for vomiting and diarrhea.  Genitourinary: Negative for flank pain.  Musculoskeletal: Negative for back pain. Skin: Negative for rash. Neurological: Negative for headache.   ____________________________________________   PHYSICAL EXAM:  VITAL SIGNS: ED Triage Vitals  Enc Vitals Group     BP --      Pulse Rate 03/21/18 1733 (!) 103     Resp 03/21/18 1733 20     Temp --      Temp Source 03/21/18 1733 Oral     SpO2 03/21/18 1733 (!) 83 %     Weight 03/21/18 1734 240 lb (108.9 kg)     Height 03/21/18 1734  (1.854 m)     Head Circumference --      Peak Flow --      Pain Score 03/21/18  1733 5     Pain Loc --      Pain Edu? --      Excl. in GC? --     Constitutional: Alert and oriented.  Weak appearing but in no acute distress. Eyes: Conjunctivae are normal.  Head: Atraumatic. Nose: Nasal congestion. Mouth/Throat: Mucous membranes are somewhat dry.   Neck: Normal range of motion.  Cardiovascular: Tachycardic, regular rhythm. Grossly normal heart sounds.  Good peripheral circulation. Respiratory: Slightly increased respiratory effort.  No retractions.  Scattered rales and rhonchi. Gastrointestinal: Soft and nontender. No distention.  Genitourinary: No flank tenderness.  Musculoskeletal: No lower extremity edema.  Extremities warm and well perfused.  Neurologic:  Normal speech and language. No gross focal neurologic deficits are appreciated.  Skin:  Skin is warm and dry. No rash noted. Psychiatric: Mood and affect are normal. Speech and behavior are normal.  ____________________________________________   LABS (all labs ordered are listed, but only abnormal results are displayed)  Labs Reviewed  BASIC METABOLIC PANEL - Abnormal; Notable for the following components:      Result Value   Glucose, Bld 115 (*)    Calcium 8.7 (*)    All other components within normal limits  CBC  TROPONIN I  ETHANOL  INFLUENZA PANEL BY PCR (TYPE A & B)  HIV ANTIBODY (ROUTINE TESTING W REFLEX)  CBC  CREATININE, SERUM  BASIC METABOLIC PANEL  CBC   ____________________________________________  EKG  ED ECG REPORT I, Dionne Bucy, the attending physician, personally viewed and interpreted this ECG.  Date: 03/21/2018 EKG Time: 1803 Rate: 96 Rhythm: normal sinus rhythm QRS Axis: normal Intervals: LAFB ST/T Wave abnormalities: normal Narrative Interpretation: no evidence of acute ischemia  ____________________________________________  RADIOLOGY  CXR: Bilateral opacities consistent with multifocal pneumonia  ____________________________________________   PROCEDURES  Procedure(s) performed: No  Procedures  Critical Care performed: Yes  CRITICAL CARE Performed by: Dionne Bucy   Total critical care time: 30 minutes  Critical care time was exclusive of separately billable procedures and treating other patients.  Critical care was necessary to treat or prevent imminent or life-threatening deterioration.  Critical care was time spent personally by me on the following activities: development of treatment plan with patient and/or surrogate as well as nursing, discussions with consultants, evaluation of patient's response to treatment,  examination of patient, obtaining history from patient or surrogate, ordering and performing treatments and interventions, ordering and review of laboratory studies, ordering and review of radiographic studies, pulse oximetry and re-evaluation of patient's condition. ____________________________________________   INITIAL IMPRESSION / ASSESSMENT AND PLAN / ED COURSE  Pertinent labs & imaging results that were available during my care of the patient were reviewed by me and considered in my medical decision making (see chart for details).  63 year old male with PMH as noted above presents with worsening shortness of breath cough, generalized weakness and malaise, and vomiting and diarrhea over the last week.  I reviewed the past medical records in Epic.  The patient was seen in the ED on 03/13/2018 with cough and congestion and diagnosed with bronchitis.  He had no imaging at that time.  On exam today, the patient is somewhat uncomfortable appearing but in no acute distress.  He is borderline tachycardic.  O2 saturation was in the mid to low 80s on room air at triage, but he is not in acute respiratory distress.  The remainder the exam is as described above.    Overall presentation is consistent with viral bronchitis, influenza, or possible pneumonia.  We will obtain chest x-ray, lab work-up, give fluids and bronchodilators and reassess.  ----------------------------------------- 7:44 PM on 03/21/2018 -----------------------------------------  The patient is feeling somewhat better after albuterol.  He remains hypoxic when taken off of oxygen.  Chest x-ray shows findings consistent with multifocal pneumonia.  The patient has not been hospitalized recently so I started antibiotics for CAP.  I signed the patient out to the hospitalist Dr. Juliene Pina for admission. ____________________________________________   FINAL CLINICAL IMPRESSION(S) / ED DIAGNOSES  Final diagnoses:  Multifocal pneumonia       NEW MEDICATIONS STARTED DURING THIS VISIT:  New Prescriptions   No medications on file     Note:  This document was prepared using Dragon voice recognition software and may include unintentional dictation errors.    Dionne Bucy, MD 03/21/18 1945

## 2018-03-21 NOTE — ED Notes (Signed)
ED TO INPATIENT HANDOFF REPORT  ED Nurse Name and Phone #: Xzavion Doswell [phone]  S Name/Age/Gender Jason Wiggins. 63 y.o. male Room/Bed: ED14A/ED14A  Code Status   Code Status: Full Code  Home/SNF/Other Home Patient oriented to: self, place, time and situation Is this baseline? Yes   Triage Complete: Triage complete  Chief Complaint flu   Triage Note Pt in via POV, seen here last week, dx with bronchitis and sent home with cough syrup.  Pt returns today with same symptoms in addition to N/V/D.  Pt hypoxic upon arrival 83% on room air.  Pt placed on 4L nasal cannula.  Pt roomed upon completion of triage.   Allergies No Known Allergies  Level of Care/Admitting Diagnosis ED Disposition    ED Disposition Condition Comment   Admit  Hospital Area: [facility] [[id number]]  Level of Care: Med-Surg [16]  Diagnosis: Hypoxia [300808]  Admitting Physician: Adrian Saran [[id number]]  Attending Physician: MODY, Patricia Pesa [[id number]]  Estimated length of stay: past midnight tomorrow  Certification:: I certify this patient will need inpatient services for at least 2 midnights  PT Class (Do Not Modify): Inpatient [101]  PT Acc Code (Do Not Modify): Private [1]       B Medical/Surgery History Past Medical History:  Diagnosis Date  . Alcohol abuse    possibly as much as 24 beers per day  . Arthritis   . HTN (hypertension)    Past Surgical History:  Procedure Laterality Date  . HERNIA REPAIR     Right inguinal  . INTRAVASCULAR PRESSURE WIRE/FFR STUDY N/A 10/28/2016   Procedure: INTRAVASCULAR PRESSURE WIRE/FFR STUDY;  Surgeon: Marykay Lex, MD;  Location: [facility] INVASIVE CV LAB;  Service: Cardiovascular;  Laterality: N/A;  . LEFT HEART CATH AND CORONARY ANGIOGRAPHY N/A 10/28/2016   Procedure: LEFT HEART CATH AND CORONARY ANGIOGRAPHY;  Surgeon: Marykay Lex, MD;  Location: [facility] INVASIVE CV LAB;  Service: Cardiovascular;  Laterality: N/A;     A IV  Location/Drains/Wounds Patient Lines/Drains/Airways Status   Active Line/Drains/Airways    Name:   Placement date:   Placement time:   Site:   Days:   Peripheral IV 03/21/18 Left Wrist   03/21/18    1811    Wrist   less than 1          Intake/Output Last 24 hours No intake or output data in the 24 hours ending 03/21/18 2042  Labs/Imaging Results for orders placed or performed during the hospital encounter of 03/21/18 (from the past 48 hour(s))  CBC     Status: None   Collection Time: 03/21/18  6:13 PM  Result Value Ref Range   WBC 6.2 4.0 - 10.5 K/uL   RBC 4.23 4.22 - 5.81 MIL/uL   Hemoglobin 14.2 13.0 - 17.0 g/dL   HCT 19.7 58.8 - 32.5 %   MCV 93.4 80.0 - 100.0 fL   MCH 33.6 26.0 - 34.0 pg   MCHC 35.9 30.0 - 36.0 g/dL   RDW 49.8 26.4 - 15.8 %   Platelets 278 150 - 400 K/uL   nRBC 0.0 0.0 - 0.2 %    Comment: Performed at [facility], [address], [place], [place] [address]  Basic metabolic panel     Status: Abnormal   Collection Time: 03/21/18  6:13 PM  Result Value Ref Range   Sodium 138 135 - 145 mmol/L   Potassium 3.6 3.5 - 5.1 mmol/L   Chloride 100 98 - 111 mmol/L  CO2 27 22 - 32 mmol/L   Glucose, Bld 115 (H) 70 - 99 mg/dL   BUN 12 8 - 23 mg/dL   Creatinine, Ser 3.080.70 0.61 - 1.24 mg/dL   Calcium 8.7 (L) 8.9 - 10.3 mg/dL   GFR calc non Af Amer >60 >60 mL/min   GFR calc Af Amer >60 >60 mL/min   Anion gap 11 5 - 15    Comment: Performed at [facility], [address] [place], [place] [address]  Troponin I - Once     Status: None   Collection Time: 03/21/18  6:44 PM  Result Value Ref Range   Troponin I <0.03 <0.03 ng/mL    Comment: Performed at [facility], [address] [place], [place] [address]  Ethanol     Status: None   Collection Time: 03/21/18  6:44 PM  Result Value Ref Range   Alcohol, Ethyl (B) <10 <10 mg/dL    Comment: (NOTE) Lowest detectable limit for serum alcohol is 10 mg/dL. For medical purposes  only. Performed at [facility], [address] [place], [place] [address]   Influenza panel by PCR (type A & B)     Status: None   Collection Time: 03/21/18  6:44 PM  Result Value Ref Range   Influenza A By PCR NEGATIVE NEGATIVE   Influenza B By PCR NEGATIVE NEGATIVE    Comment: (NOTE) The Xpert Xpress Flu assay is intended as an aid in the diagnosis of  influenza and should not be used as a sole basis for treatment.  This  assay is FDA approved for nasopharyngeal swab specimens only. Nasal  washings and aspirates are unacceptable for Xpert Xpress Flu testing. Performed at [facility], [address] [place], [place] [address]   CBC     Status: Abnormal   Collection Time: 03/21/18  8:02 PM  Result Value Ref Range   WBC 6.4 4.0 - 10.5 K/uL   RBC 3.83 (L) 4.22 - 5.81 MIL/uL   Hemoglobin 12.6 (L) 13.0 - 17.0 g/dL   HCT 10.235.9 (L) 72.539.0 - 36.652.0 %   MCV 93.7 80.0 - 100.0 fL   MCH 32.9 26.0 - 34.0 pg   MCHC 35.1 30.0 - 36.0 g/dL   RDW 44.012.5 34.711.5 - 42.515.5 %   Platelets 249 150 - 400 K/uL   nRBC 0.0 0.0 - 0.2 %    Comment: Performed at [facility], [address] [place], [place] [address]  Creatinine, serum     Status: None   Collection Time: 03/21/18  8:02 PM  Result Value Ref Range   Creatinine, Ser 0.75 0.61 - 1.24 mg/dL   GFR calc non Af Amer >60 >60 mL/min   GFR calc Af Amer >60 >60 mL/min    Comment: Performed at [facility], [address] [place], [place] [address]   Dg Chest 2 View  Result Date: 03/21/2018 CLINICAL DATA:  Cough, shortness of breath, left chest pain EXAM: CHEST - 2 VIEW COMPARISON:  CTA chest dated 09/29/2016 FINDINGS: Multifocal patchy/interstitial opacities in the right upper lobe, lingula, and bilateral lower lobes, suspicious for multifocal pneumonia. No pleural effusion or pneumothorax. The heart is normal in size. IMPRESSION: Multifocal patchy/interstitial opacities, suspicious for multifocal pneumonia.  Electronically Signed   By: Charline BillsSriyesh  Krishnan M.D.   On: 03/21/2018 18:11    Pending Labs Unresulted Labs (From admission, onward)    Start     Ordered   03/28/18 0500  Creatinine,  serum  (enoxaparin (LOVENOX)    CrCl >/= 30 ml/min)  Weekly,   STAT    Comments:  while on enoxaparin therapy    03/21/18 1936   03/22/18 0500  Basic metabolic panel  Tomorrow morning,   STAT     03/21/18 1936   03/22/18 0500  CBC  Tomorrow morning,   STAT     03/21/18 1936   03/21/18 1936  HIV antibody (Routine Testing)  Once,   STAT     03/21/18 1936          Vitals/Pain Today's Vitals   03/21/18 1830 03/21/18 1900 03/21/18 1922 03/21/18 1929  BP: 135/72 138/77    Pulse: 85 (!) 104    Resp: (!) 38 (!) 45    Temp:    98 F (36.7 C)  TempSrc:    Oral  SpO2: 96% (!) 89%    Weight:      Height:      PainSc:   5      Isolation Precautions Droplet precaution  Medications Medications  azithromycin (ZITHROMAX) 500 mg in sodium chloride 0.9 % 250 mL IVPB (has no administration in time range)  cefTRIAXone (ROCEPHIN) 1 g in sodium chloride 0.9 % 100 mL IVPB (has no administration in time range)  enoxaparin (LOVENOX) injection 40 mg (has no administration in time range)  0.9 %  sodium chloride infusion (has no administration in time range)  acetaminophen (TYLENOL) tablet 650 mg (has no administration in time range)    Or  acetaminophen (TYLENOL) suppository 650 mg (has no administration in time range)  polyethylene glycol (MIRALAX / GLYCOLAX) packet 17 g (has no administration in time range)  HYDROcodone-acetaminophen (NORCO/VICODIN) 5-325 MG per tablet 1-2 tablet (has no administration in time range)  ondansetron (ZOFRAN) tablet 4 mg (has no administration in time range)    Or  ondansetron (ZOFRAN) injection 4 mg (has no administration in time range)  LORazepam (ATIVAN) tablet 1 mg (has no administration in time range)    Or  LORazepam (ATIVAN) injection 1 mg (has no administration in time  range)  thiamine (VITAMIN B-1) tablet 100 mg (has no administration in time range)    Or  thiamine (B-1) injection 100 mg (has no administration in time range)  folic acid (FOLVITE) tablet 1 mg (has no administration in time range)  multivitamin with minerals tablet 1 tablet (has no administration in time range)  LORazepam (ATIVAN) tablet 0-4 mg (has no administration in time range)    Followed by  LORazepam (ATIVAN) tablet 0-4 mg (has no administration in time range)  albuterol (PROVENTIL) (2.5 MG/3ML) 0.083% nebulizer solution 5 mg (5 mg Nebulization Given 03/21/18 1836)  sodium chloride 0.9 % bolus 1,000 mL (0 mLs Intravenous Stopped 03/21/18 2001)  ipratropium-albuterol (DUONEB) 0.5-2.5 (3) MG/3ML nebulizer solution 3 mL (3 mLs Nebulization Given 03/21/18 1836)    Mobility walks Moderate fall risk   Focused Assessments Pulmonary Assessment Handoff:  Lung sounds:   O2 Device: Room Air        R Recommendations: See Admitting Provider Note  Report given to:   Additional Notes:

## 2018-03-21 NOTE — ED Triage Notes (Signed)
Pt in via POV, seen here last week, dx with bronchitis and sent home with cough syrup.  Pt returns today with same symptoms in addition to N/V/D.  Pt hypoxic upon arrival 83% on room air.  Pt placed on 4L nasal cannula.  Pt roomed upon completion of triage.

## 2018-03-22 LAB — CBC
HEMATOCRIT: 33.7 % — AB (ref 39.0–52.0)
Hemoglobin: 11.7 g/dL — ABNORMAL LOW (ref 13.0–17.0)
MCH: 32.5 pg (ref 26.0–34.0)
MCHC: 34.7 g/dL (ref 30.0–36.0)
MCV: 93.6 fL (ref 80.0–100.0)
Platelets: 252 10*3/uL (ref 150–400)
RBC: 3.6 MIL/uL — ABNORMAL LOW (ref 4.22–5.81)
RDW: 12.5 % (ref 11.5–15.5)
WBC: 5.2 10*3/uL (ref 4.0–10.5)
nRBC: 0 % (ref 0.0–0.2)

## 2018-03-22 LAB — BASIC METABOLIC PANEL
Anion gap: 8 (ref 5–15)
BUN: 9 mg/dL (ref 8–23)
CHLORIDE: 105 mmol/L (ref 98–111)
CO2: 27 mmol/L (ref 22–32)
Calcium: 8.3 mg/dL — ABNORMAL LOW (ref 8.9–10.3)
Creatinine, Ser: 0.72 mg/dL (ref 0.61–1.24)
GFR calc Af Amer: >60 mL/min (ref >60)
GFR calc non Af Amer: >60 mL/min (ref >60)
Glucose, Bld: 116 mg/dL — ABNORMAL HIGH (ref 70–99)
Potassium: 3.5 mmol/L (ref 3.5–5.1)
Sodium: 140 mmol/L (ref 135–145)

## 2018-03-22 MED ORDER — POTASSIUM CHLORIDE CRYS ER 20 MEQ PO TBCR
20.0000 meq | EXTENDED_RELEASE_TABLET | Freq: Once | ORAL | Status: AC
  Administered 2018-03-22: 20 meq via ORAL
  Filled 2018-03-22: qty 1

## 2018-03-22 NOTE — Progress Notes (Signed)
[employer] - [place] at [facility]   PATIENT NAME: Jason Wiggins    MR#:  [id number]  DATE OF BIRTH:  [birth year]  SUBJECTIVE:  CHIEF COMPLAINT:   Chief Complaint  Patient presents with  . Bronchitis  Patient seen and evaluated today Shortness of breath and cough present No fever No Chest pain  REVIEW OF SYSTEMS:    ROS  CONSTITUTIONAL: No documented fever. Has fatigue, weakness. No weight gain, no weight loss.  EYES: No blurry or double vision.  ENT: No tinnitus. No postnasal drip. No redness of the oropharynx.  RESPIRATORY: Has cough, no wheeze, no hemoptysis. Has dyspnea.  CARDIOVASCULAR: No chest pain. No orthopnea. No palpitations. No syncope.  GASTROINTESTINAL: No nausea, no vomiting or diarrhea. No abdominal pain. No melena or hematochezia.  GENITOURINARY: No dysuria or hematuria.  ENDOCRINE: No polyuria or nocturia. No heat or cold intolerance.  HEMATOLOGY: No anemia. No bruising. No bleeding.  INTEGUMENTARY: No rashes. No lesions.  MUSCULOSKELETAL: No arthritis. No swelling. No gout.  NEUROLOGIC: No numbness, tingling, or ataxia. No seizure-type activity.  PSYCHIATRIC: No anxiety. No insomnia. No ADD.   DRUG ALLERGIES:  No Known Allergies  VITALS:  Blood pressure 127/75, pulse 73, temperature 97.8 F (36.6 C), temperature source Oral, resp. rate 18, height 6\' 1"  (1.854 m), weight 100.4 kg, SpO2 94 %.  PHYSICAL EXAMINATION:   Physical Exam  GENERAL:  63 y.o.-year-old patient lying in the bed with no acute distress.  EYES: Pupils equal, round, reactive to light and accommodation. No scleral icterus. Extraocular muscles intact.  HEENT: Head atraumatic, normocephalic. Oropharynx and nasopharynx clear.  NECK:  Supple, no jugular venous distention. No thyroid enlargement, no tenderness.  LUNGS: Decreased breath sounds bilaterally, scattered rales heard. No use of accessory muscles of respiration.  CARDIOVASCULAR: S1, S2 normal. No murmurs, rubs, or  gallops.  ABDOMEN: Soft, nontender, nondistended. Bowel sounds present. No organomegaly or mass.  EXTREMITIES: No cyanosis, clubbing or edema b/l.    NEUROLOGIC: Cranial nerves II through XII are intact. No focal Motor or sensory deficits b/l.   PSYCHIATRIC: The patient is alert and oriented x 3.  SKIN: No obvious rash, lesion, or ulcer.   LABORATORY PANEL:   CBC Recent Labs  Lab 03/22/18 0405  WBC 5.2  HGB 11.7*  HCT 33.7*  PLT 252   ------------------------------------------------------------------------------------------------------------------ Chemistries  Recent Labs  Lab 03/22/18 0405  NA 140  K 3.5  CL 105  CO2 27  GLUCOSE 116*  BUN 9  CREATININE 0.72  CALCIUM 8.3*   ------------------------------------------------------------------------------------------------------------------  Cardiac Enzymes Recent Labs  Lab 03/21/18 1844  TROPONINI <0.03   ------------------------------------------------------------------------------------------------------------------  RADIOLOGY:  Dg Chest 2 View  Result Date: 03/21/2018 CLINICAL DATA:  Cough, shortness of breath, left chest pain EXAM: CHEST - 2 VIEW COMPARISON:  CTA chest dated 09/29/2016 FINDINGS: Multifocal patchy/interstitial opacities in the right upper lobe, lingula, and bilateral lower lobes, suspicious for multifocal pneumonia. No pleural effusion or pneumothorax. The heart is normal in size. IMPRESSION: Multifocal patchy/interstitial opacities, suspicious for multifocal pneumonia. Electronically Signed   By: Charline Bills M.D.   On: 03/21/2018 18:11     ASSESSMENT AND PLAN:   63 year old male patient with history of alcohol abuse currently under hospitalist service for respiratory distress.  -Acute respiratory distress with hypoxia Continue oxygen via nasal cannula  -Multifocal pneumonia Continue IV Rocephin and Zithromax antibiotic Influenza test is negative  -Systemic inflammatory response  syndrome secondary to pneumonia IV antibiotics and supportive care Follow-up lactic acid  levels  -Alcohol abuse CIWA protocol  All the records are reviewed and case discussed with Care Management/Social Worker. Management plans discussed with the patient, family and they are in agreement.  CODE STATUS: Full code  DVT Prophylaxis: SCDs  TOTAL TIME TAKING CARE OF THIS PATIENT: 37 minutes.   POSSIBLE D/C IN 2 to 3 DAYS, DEPENDING ON CLINICAL CONDITION.  Ihor Austin M.D on 03/22/2018 at 1:22 PM  Between 7am to 6pm - Pager - [phone]  After 6pm go to [email or web address] - password [email or web address]  [facility]  Office  [phone]  CC: Primary care physician; Patient, No Pcp Per  Note: This dictation was prepared with Dragon dictation along with smaller phrase technology. Any transcriptional errors that result from this process are unintentional.

## 2018-03-22 NOTE — Progress Notes (Signed)
Advanced care plan. Purpose of the Encounter: CODE STATUS Parties in Attendance: Patient Patient's Decision Capacity: Good Subjective/Patient's story: Jason Wiggins  is a 63 y.o. male with a known history of Etoh abuse who presents today to the emergency room due to shortness of breath.He has generalized weakness and malaise.  Denies any fevers.  He does have a productive cough. No recent travel history.  No body aches. Objective/Medical story Patient needs inpatient hospitalization and IV antibiotic therapy.  Flu testing.  IV fluids failure. Goals of care determination:  Advance care directives and goals of care treatment plan discussed. Patient wants everything done which includes CPR, intubation ventilator the need arises. CODE STATUS: Full code Time spent discussing advanced care planning: 16 minutes

## 2018-03-23 LAB — HIV ANTIBODY (ROUTINE TESTING W REFLEX): HIV Screen 4th Generation wRfx: NONREACTIVE

## 2018-03-23 MED ORDER — AMOXICILLIN-POT CLAVULANATE 875-125 MG PO TABS: 1.0000 | ORAL_TABLET | Freq: Two times a day (BID) | ORAL | 0 refills | Status: AC

## 2018-03-23 NOTE — Progress Notes (Signed)
[employer] - [place] [facility]   PATIENT NAME: Jason Wiggins    MR#:  [id number]  DATE OF BIRTH:  [birth year]  SUBJECTIVE:  CHIEF COMPLAINT:   Chief Complaint  Patient presents with  . Bronchitis  Patient seen and evaluated today Shortness of breath and cough improved No fever No Chest pain Desaturated on room air Had to be put on oxygen via [place] at 2liter  REVIEW OF SYSTEMS:    ROS  CONSTITUTIONAL: No documented fever. Has fatigue, weakness. No weight gain, no weight loss.  EYES: No blurry or double vision.  ENT: No tinnitus. No postnasal drip. No redness of the oropharynx.  RESPIRATORY: Has decreased cough, no wheeze, no hemoptysis. Has decreased dyspnea.  CARDIOVASCULAR: No chest pain. No orthopnea. No palpitations. No syncope.  GASTROINTESTINAL: No nausea, no vomiting or diarrhea. No abdominal pain. No melena or hematochezia.  GENITOURINARY: No dysuria or hematuria.  ENDOCRINE: No polyuria or nocturia. No heat or cold intolerance.  HEMATOLOGY: No anemia. No bruising. No bleeding.  INTEGUMENTARY: No rashes. No lesions.  MUSCULOSKELETAL: No arthritis. No swelling. No gout.  NEUROLOGIC: No numbness, tingling, or ataxia. No seizure-type activity.  PSYCHIATRIC: No anxiety. No insomnia. No ADD.   DRUG ALLERGIES:  No Known Allergies  VITALS:  Blood pressure 128/73, pulse 78, temperature 98.8 F (37.1 C), temperature source Oral, resp. rate (!) 22, height 6\' 1"  (1.854 m), weight 100.4 kg, SpO2 90 %.  PHYSICAL EXAMINATION:   Physical Exam  GENERAL:  63 y.o.-year-old patient lying in the bed with no acute distress.  EYES: Pupils equal, round, reactive to light and accommodation. No scleral icterus. Extraocular muscles intact.  HEENT: Head atraumatic, normocephalic. Oropharynx and nasopharynx clear.  NECK:  Supple, no jugular venous distention. No thyroid enlargement, no tenderness.  LUNGS: Improved breath sounds bilaterally, scattered rales heard. No use  of accessory muscles of respiration.  CARDIOVASCULAR: S1, S2 normal. No murmurs, rubs, or gallops.  ABDOMEN: Soft, nontender, nondistended. Bowel sounds present. No organomegaly or mass.  EXTREMITIES: No cyanosis, clubbing or edema b/l.    NEUROLOGIC: Cranial nerves II through XII are intact. No focal Motor or sensory deficits b/l.   PSYCHIATRIC: The patient is alert and oriented x 3.  SKIN: No obvious rash, lesion, or ulcer.   LABORATORY PANEL:   CBC Recent Labs  Lab 03/22/18 0405  WBC 5.2  HGB 11.7*  HCT 33.7*  PLT 252   ------------------------------------------------------------------------------------------------------------------ Chemistries  Recent Labs  Lab 03/22/18 0405  NA 140  K 3.5  CL 105  CO2 27  GLUCOSE 116*  BUN 9  CREATININE 0.72  CALCIUM 8.3*   ------------------------------------------------------------------------------------------------------------------  Cardiac Enzymes Recent Labs  Lab 03/21/18 1844  TROPONINI <0.03   ------------------------------------------------------------------------------------------------------------------  RADIOLOGY:  Dg Chest 2 View  Result Date: 03/21/2018 CLINICAL DATA:  Cough, shortness of breath, left chest pain EXAM: CHEST - 2 VIEW COMPARISON:  CTA chest dated 09/29/2016 FINDINGS: Multifocal patchy/interstitial opacities in the right upper lobe, lingula, and bilateral lower lobes, suspicious for multifocal pneumonia. No pleural effusion or pneumothorax. The heart is normal in size. IMPRESSION: Multifocal patchy/interstitial opacities, suspicious for multifocal pneumonia. Electronically Signed   By: Charline Bills M.D.   On: 03/21/2018 18:11     ASSESSMENT AND PLAN:   63 year old male patient with history of alcohol abuse currently under hospitalist service for respiratory distress.  -s/p acute respiratory distress Hypoxia On oxygen via [place]  -Multifocal pneumonia improving Continue IV Rocephin and  Zithromax antibiotic Influenza test is  negative  -Systemic inflammatory response syndrome secondary to pneumonia improved IV antibiotics and supportive care  -Alcohol abuse CIWA protocol  -Disposition Based on home oxygen arrangement by CSM  All the records are reviewed and case discussed with Care Management/Social Worker. Management plans discussed with the patient, family and they are in agreement.  CODE STATUS: Full code  DVT Prophylaxis: SCDs  TOTAL TIME TAKING CARE OF THIS PATIENT: 35 minutes.   POSSIBLE D/C IN 2 to 3 DAYS, DEPENDING ON CLINICAL CONDITION.  Ihor Austin M.D on 03/23/2018 at 2:39 PM  Between 7am to 6pm - Pager - [phone]  After 6pm go to [email or web address] - password [email or web address]  [facility] [place] [facility]  Office  [phone]  CC: Primary care physician; Patient, No Pcp Per  Note: This dictation was prepared with Dragon dictation along with smaller phrase technology. Any transcriptional errors that result from this process are unintentional.

## 2018-03-23 NOTE — Discharge Summary (Signed)
[facility] - [place] at [facility]   PATIENT NAME: Jason Wiggins    MR#:  [id number]  DATE OF BIRTH:  04-20-1955  DATE OF ADMISSION:  03/21/2018 ADMITTING PHYSICIAN: Adrian Saran, MD  DATE OF DISCHARGE: 03/23/2018  PRIMARY CARE PHYSICIAN: Patient, No Pcp Per   ADMISSION DIAGNOSIS:  Multifocal pneumonia [J18.9] Hypoxia secondary to pneumonia DISCHARGE DIAGNOSIS:  Active Problems:   Hypoxia Multifocal pneumonia Sepsis secondary to pneumonia Alcohol abuse Hypoxia secondary to pneumonia  SECONDARY DIAGNOSIS:   Past Medical History:  Diagnosis Date  . Alcohol abuse    possibly as much as 24 beers per day  . Arthritis   . HTN (hypertension)      ADMITTING HISTORY Jason Wiggins  is a 63 y.o. male with a known history of Etoh abuse who presents today to the emergency room due to shortness of breath.He has generalized weakness and malaise.  Denies any fevers.  He does have a productive cough. No recent travel history.  No body aches.Patient was seen in the emergency room about 8 days ago and diagnosed with bronchitis and discharged with cough syrup.  His symptoms have worsened so he comes to the ER for further evaluation.Patient was hypoxic upon arrival with O2 saturation of 83% on room air.  He was placed on 4 L of nasal cannula.  He is currently on 3 L nasal cannula.CXR shows multifocal pneumonia.  He has been started on Rocephin and azithromycin.Last Etoh drink was 3 weeks ago.  HOSPITAL COURSE:  Patient admitted to medical floor.  Was put on oxygen via nasal cannula.  Patient was started on IV Rocephin and Zithromax antibiotics.  Patient tolerated antibiotics well.  Flu test was negative.  HIV test was negative.  Alcohol level less than 10.  Patient shortness of breath improved.  Fevers completely resolved.  Patient tolerating diet well.  Patient still needs oxygen via nasal cannula at 2 L.  He qualified for oxygen.  Patient received CIWA protocol for alcohol  abuse.  CONSULTS OBTAINED:    DRUG ALLERGIES:  No Known Allergies  DISCHARGE MEDICATIONS:   Allergies as of 03/23/2018   No Known Allergies     Medication List    TAKE these medications   amoxicillin-clavulanate 875-125 MG tablet Commonly known as:  Augmentin Take 1 tablet by mouth every 12 (twelve) hours for 5 days.   nitroGLYCERIN 0.4 MG SL tablet Commonly known as:  NITROSTAT Place 1 tablet (0.4 mg total) under the tongue every 5 (five) minutes as needed for chest pain.            Durable Medical Equipment  (From admission, onward)         Start     Ordered   03/23/18 0929  For home use only DME oxygen  Once    Question Answer Comment  Mode or (Route) Nasal cannula   Liters per Minute 2   Frequency Continuous (stationary and portable oxygen unit needed)   Oxygen conserving device Yes   Oxygen delivery system Gas      03/23/18 0928          Today  Patient seen and evaluated today Tolerating diet well No fever Qualified for home oxygen Hemodynamically stable Will be discharged home on oral antibiotic  VITAL SIGNS:  Blood pressure 128/73, pulse 78, temperature 98.8 F (37.1 C), temperature source Oral, resp. rate (!) 22, height 6\' 1"  (1.854 m), weight 100.4 kg, SpO2 90 %.  I/O:    Intake/Output Summary (  Last 24 hours) at 03/23/2018 1108 Last data filed at 03/23/2018 1021 Gross per 24 hour  Intake 3598.16 ml  Output -  Net 3598.16 ml    PHYSICAL EXAMINATION:  Physical Exam  GENERAL:  63 y.o.-year-old patient lying in the bed with no acute distress.  LUNGS: Normal breath sounds bilaterally, no wheezing, rales,rhonchi or crepitation. No use of accessory muscles of respiration.  CARDIOVASCULAR: S1, S2 normal. No murmurs, rubs, or gallops.  ABDOMEN: Soft, non-tender, non-distended. Bowel sounds present. No organomegaly or mass.  NEUROLOGIC: Moves all 4 extremities. PSYCHIATRIC: The patient is alert and oriented x 3.  SKIN: No obvious rash,  lesion, or ulcer.   DATA REVIEW:   CBC Recent Labs  Lab 03/22/18 0405  WBC 5.2  HGB 11.7*  HCT 33.7*  PLT 252    Chemistries  Recent Labs  Lab 03/22/18 0405  NA 140  K 3.5  CL 105  CO2 27  GLUCOSE 116*  BUN 9  CREATININE 0.72  CALCIUM 8.3*    Cardiac Enzymes Recent Labs  Lab 03/21/18 1844  TROPONINI <0.03    Microbiology Results  No results found for this or any previous visit.  RADIOLOGY:  Dg Chest 2 View  Result Date: 03/21/2018 CLINICAL DATA:  Cough, shortness of breath, left chest pain EXAM: CHEST - 2 VIEW COMPARISON:  CTA chest dated 09/29/2016 FINDINGS: Multifocal patchy/interstitial opacities in the right upper lobe, lingula, and bilateral lower lobes, suspicious for multifocal pneumonia. No pleural effusion or pneumothorax. The heart is normal in size. IMPRESSION: Multifocal patchy/interstitial opacities, suspicious for multifocal pneumonia. Electronically Signed   By: Charline Bills M.D.   On: 03/21/2018 18:11    Follow up with PCP in 1 week.  Management plans discussed with the patient, family and they are in agreement.  CODE STATUS: Full code    Code Status Orders  (From admission, onward)         Start     Ordered   03/21/18 1936  Full code  Continuous     03/21/18 1936        Code Status History    Date Active Date Inactive Code Status Order ID Comments User Context   10/28/2016 1036 10/28/2016 1808 Full Code [id number]  Marykay Lex, MD Inpatient      TOTAL TIME TAKING CARE OF THIS PATIENT ON DAY OF DISCHARGE: more than 34 minutes.   Ihor Austin M.D on 03/23/2018 at 11:08 AM  Between 7am to 6pm - Pager - [phone]  After 6pm go to [email or web address] - password [email or web address]  [facility] [place] [facility]  Office  [phone]  CC: Primary care physician; Patient, No Pcp Per  Note: This dictation was prepared with Dragon dictation along with smaller phrase technology. Any transcriptional errors that result from this process  are unintentional.

## 2018-03-23 NOTE — Progress Notes (Signed)
Advanced care plan. Purpose of the Encounter: CODE STATUS Parties in Attendance: Patient Patient's Decision Capacity: Good Subjective/Patient's story: Jason Wiggins  is a 63 y.o. male with a known history of Etoh abuse who presents today to the emergency room due to shortness of breath.He has generalized weakness and malaise.  Denies any fevers.  He does have a productive cough. No recent travel history.  No body aches.Patient was hypoxic upon arrival with O2 saturation of 83% on room air.  He was placed on 4 L of nasal cannula.  He is currently on 3 L nasal cannula.CXR shows multifocal pneumonia.  He has been started on Rocephin and azithromycin Objective/Medical story Patient has multifocal pneumonia.  Hypoxic on presentation to the emergency room.  Needs oxygen via nasal cannula and IV antibiotics and further work-up. Goals of care determination:  Advance care directives goals of care and treatment plan discussed.  Patient wants everything done which includes CPR, intubation ventilator if the need arises CODE STATUS:  Full code Time spent discussing advanced care planning: 16 minutes

## 2018-03-23 NOTE — Progress Notes (Signed)
[facility] - [place] at [facility]    Jason Wiggins was admitted to the Hospital on 03/21/2018 and Discharged  03/23/2018 and should be excused from work/school from 03/21/2018 to 03/25/2018  Call Ihor Austin MD with questions.  Ihor Austin M.D on 03/23/2018,at 9:58 AM  [facility] - [place] at [facility]  [phone]

## 2018-03-23 NOTE — Discharge Planning (Signed)
Patient SPO2 83% on room air (at rest) Patient SPO2 90% on 2L [place]. If discharged today, patient will currently need 2L [place] continuous at home.

## 2018-03-23 NOTE — TOC Transition Note (Signed)
Transition of Care [facility]) - CM/SW Discharge Note   Patient Details  Name: Jason Wiggins. MRN: [id number] Date of Birth: January 01, 1956  Transition of Care [facility]) CM/SW Contact:  Allayne Butcher, RN Phone Number: 03/23/2018, 11:17 AM   Clinical Narrative:    Patient admitted with hypoxia secondary to pneumonia.  Patient has a history of alcohol abuse.  Discharge orders have been placed, patient needs home oxygen.  Patient has no insurance so RNCM is working with [employer] to try and get oxygen set up.  Brad with [employer] will have patient fill out a charity application.  RNCM will follow up.    Final next level of care: Home/Self Care Barriers to Discharge: No Barriers Identified   Patient Goals and CMS Choice     Choice offered to / list presented to : Patient  Discharge Placement                       Discharge Plan and Services Discharge Planning Services: CM Consult Post Acute Care Choice: Durable Medical Equipment          DME Arranged: Oxygen         Social Determinants of Health (SDOH) Interventions     Readmission Risk Interventions No flowsheet data found.

## 2018-03-23 NOTE — Discharge Planning (Signed)
Patient IV removed.  RN assessment and VS revealed stability for DC to home with home O2.  Discharge papers given, explained and educated.  Informed of suggested FU appt and patient agreed to go to the [facility] (since they do not accept appts).  Script sent to [facility], [place] and wk note given.  Once 02 is delivered - patient can be discharged.  Waiting on delivery and patient will be wheeled to front and family transporting home via car.

## 2018-03-23 NOTE — TOC Transition Note (Addendum)
Transition of Care [facility]) - CM/SW Discharge Note   Patient Details  Name: Silver Wisner. MRN: [id number] Date of Birth: 04/20/1955  Transition of Care [facility]) CM/SW Contact:  Allayne Butcher, RN Phone Number: 03/23/2018, 2:19 PM   Clinical Narrative:    Update Mitchell Heir with [employer] will bring oxygen to the bedside and then patient will be ready for discharge.  Bedside RN updated.    Final next level of care: Home/Self Care Barriers to Discharge: No Barriers Identified   Patient Goals and CMS Choice     Choice offered to / list presented to : Patient  Discharge Placement                       Discharge Plan and Services Discharge Planning Services: CM Consult Post Acute Care Choice: Durable Medical Equipment          DME Arranged: Oxygen         Social Determinants of Health (SDOH) Interventions     Readmission Risk Interventions No flowsheet data found.

## 2018-03-31 ENCOUNTER — Encounter: Payer: Self-pay | Admitting: Emergency Medicine

## 2018-03-31 ENCOUNTER — Emergency Department
Admission: EM | Admit: 2018-03-31 | Discharge: 2018-03-31 | Disposition: A | Discharge status: Home / Self Care | Payer: Self-pay | Attending: Emergency Medicine | Admitting: Emergency Medicine

## 2018-03-31 ENCOUNTER — Other Ambulatory Visit: Payer: Self-pay

## 2018-03-31 DIAGNOSIS — Z09 Encounter for follow-up examination after completed treatment for conditions other than malignant neoplasm: Secondary | ICD-10-CM | POA: Insufficient documentation

## 2018-03-31 DIAGNOSIS — J189 Pneumonia, unspecified organism: Secondary | ICD-10-CM | POA: Insufficient documentation

## 2018-03-31 NOTE — Discharge Instructions (Addendum)
Return to the emergency room for any new or worrisome symptoms including but not limited to fever or shortness of breath. You are at increased risk from the coronavirus, so stay as far away from other people as you can. You have asked for a note to go back to work; please do not over exert your self and wear your oxygen at all time. Be careful operating heavy machinery especially if you are having breathing difficulty. It is very important for you to have a primary care. Please call one listed above on Monday for an appointment.

## 2018-03-31 NOTE — ED Provider Notes (Signed)
[facility] Emergency Department Provider Note  ____________________________________________   I have reviewed the triage vital signs and the nursing notes. Where available I have reviewed prior notes and, if possible and indicated, outside hospital notes.    HISTORY  Chief Complaint Pneumonia    HPI Jason Wiggins. is a 63 y.o. male   Had a recent pneumonia, 3 weeks ago, he states he feels recovered, he is no longer coughing, he does not have a fever anymore, he is doing well on 2 L of oxygen.  He is not here because he is sick.  He is here because he needs a note to go back to work.  He did not have any known coronavirus exposures, he was sick long before there was any evidence of community spread in this area.     Past Medical History:  Diagnosis Date  . Alcohol abuse    possibly as much as 24 beers per day  . Arthritis   . HTN (hypertension)     Patient Active Problem List   Diagnosis Date Noted  . Hypoxia 03/21/2018  . Bleach ingestion 08/14/2017  . 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 Substance induced mood disorder (HCC) 08/14/2017  . Alcohol abuse 08/14/2017  . Unstable angina (HCC) 10/22/2016  . Hyperlipidemia 10/22/2016  . Medication management 10/22/2016    Past Surgical History:  Procedure Laterality Date  . HERNIA REPAIR     Right inguinal  . INTRAVASCULAR PRESSURE WIRE/FFR STUDY N/A 10/28/2016   Procedure: INTRAVASCULAR PRESSURE WIRE/FFR STUDY;  Surgeon: Marykay Lex, MD;  Location: [facility] INVASIVE CV LAB;  Service: Cardiovascular;  Laterality: N/A;  . LEFT HEART CATH AND CORONARY ANGIOGRAPHY N/A 10/28/2016   Procedure: LEFT HEART CATH AND CORONARY ANGIOGRAPHY;  Surgeon: Marykay Lex, MD;  Location: [facility] INVASIVE CV LAB;  Service: Cardiovascular;  Laterality: N/A;    Prior to Admission medications   Medication Sig Start Date End Date Taking? Authorizing Provider  nitroGLYCERIN  (NITROSTAT) 0.4 MG SL tablet Place 1 tablet (0.4 mg total) under the tongue every 5 (five) minutes as needed for chest pain. 10/21/16 03/21/18  Rollene Rotunda, MD    Allergies Patient has no known allergies.  Family History  Problem Relation Age of Onset  . Cancer Mother   . Cancer Father     Social History Social History   Tobacco Use  . Smoking status: Never Smoker  . Smokeless tobacco: Never Used  Substance Use Topics  . Alcohol use: Yes    Comment: daily, 4 - 5 beers per day  . Drug use: No    Review of Systems Constitutional: No fever/chills Eyes: No visual changes. ENT: No sore throat. No stiff neck no neck pain Cardiovascular: Denies chest pain. Respiratory: Denies shortness of breath. Gastrointestinal:   no vomiting.  No diarrhea.  No constipation. Genitourinary: Negative for dysuria. Musculoskeletal: Negative lower extremity swelling Skin: Negative for rash. Neurological: Negative for severe headaches, focal weakness or numbness.   ____________________________________________   PHYSICAL EXAM:  VITAL SIGNS: ED Triage Vitals  Enc Vitals Group     BP 03/31/18 0823 (!) 142/87     Pulse Rate 03/31/18 0823 100     Resp 03/31/18 0823 20     Temp 03/31/18 0823 97.6 F (36.4 C)     Temp Source 03/31/18 0823 Oral     SpO2 03/31/18 0823 95 %     Weight 03/31/18 0825 240 lb (108.9 kg)  Height 03/31/18 0825 6\' 1"  (1.854 m)     Head Circumference --      Peak Flow --      Pain Score 03/31/18 0824 0     Pain Loc --      Pain Edu? --      Excl. in GC? --     Constitutional: Alert and oriented. Well appearing and in no acute distress. Eyes: Conjunctivae are normal Head: Atraumatic HEENT: No congestion/rhinnorhea. Mucous membranes are moist.  Oropharynx non-erythematous Neck:   Nontender with no meningismus, no masses, no stridor Cardiovascular: Normal rate, regular rhythm. Grossly normal heart sounds.  Good peripheral circulation. Respiratory: Normal  respiratory effort.  No retractions. Lungs CTAB. Abdominal: Soft and nontender. No distention. No guarding no rebound Back:  There is no focal tenderness or step off.  there is no midline tenderness there are no lesions noted. there is no CVA tenderness Musculoskeletal: No lower extremity tenderness, no upper extremity tenderness. No joint effusions, no DVT signs strong distal pulses no edema Neurologic:  Normal speech and language. No gross focal neurologic deficits are appreciated.  Skin:  Skin is warm, dry and intact. No rash noted. Psychiatric: Mood and affect are normal. Speech and behavior are normal.  ____________________________________________   LABS (all labs ordered are listed, but only abnormal results are displayed)  Labs Reviewed - No data to display  Pertinent labs  results that were available during my care of the patient were reviewed by me and considered in my medical decision making (see chart for details). ____________________________________________  EKG  I personally interpreted any EKGs ordered by me or triage  ____________________________________________  RADIOLOGY  Pertinent labs & imaging results that were available during my care of the patient were reviewed by me and considered in my medical decision making (see chart for details). If possible, patient and/or family made aware of any abnormal findings.  No results found. ____________________________________________    PROCEDURES  Procedure(s) performed: None  Procedures  Critical Care performed: None  ____________________________________________   INITIAL IMPRESSION / ASSESSMENT AND PLAN / ED COURSE  Pertinent labs & imaging results that were available during my care of the patient were reviewed by me and considered in my medical decision making (see chart for details).  Patient here for a work note, which I can give him at this time he is satting well on 2 L his lungs sound clear at this  time.  Patient declines further work-up including repeat chest x-ray etc.  I have again stressed the need for outpatient follow-up, we are not a good facility certainly during a pandemic for him to come to for work notes if he is suffering from respiratory compromise at baseline.  Extensive return precautions and follow-up given and understood.  Low suspicion for coronavirus in this patient given timing of his disease.  However, there is no testing available anyway so we certainly cannot test him.  He has not had any ongoing symptoms and is he was sick 3 weeks ago before there was coronavirus here so I do not think there is any point quarantining him at this point except for the need for him to avoid other people who might have it which I have stressed to him as well    ____________________________________________   FINAL CLINICAL IMPRESSION(S) / ED DIAGNOSES  Final diagnoses:  None      This chart was dictated using voice recognition software.  Despite best efforts to proofread,  errors can occur which can  change meaning.      Jeanmarie Plant, MD 03/31/18 [phone]

## 2018-03-31 NOTE — ED Triage Notes (Signed)
Patient was diagnosed with pneumonia last week. Unable to get follow up at clinic.

## 2018-04-21 ENCOUNTER — Other Ambulatory Visit: Payer: Self-pay

## 2018-04-21 ENCOUNTER — Emergency Department: Payer: Self-pay

## 2018-04-21 ENCOUNTER — Emergency Department
Admission: EM | Admit: 2018-04-21 | Discharge: 2018-04-21 | Discharge status: Against Medical Advice | Payer: Self-pay | Attending: Emergency Medicine | Admitting: Emergency Medicine

## 2018-04-21 ENCOUNTER — Encounter: Payer: Self-pay | Admitting: Internal Medicine

## 2018-04-21 ENCOUNTER — Inpatient Hospital Stay
Admission: EM | Admit: 2018-04-21 | Discharge: 2018-04-23 | DRG: 195 | Disposition: A | Discharge status: Home / Self Care | Payer: Self-pay | Attending: Internal Medicine | Admitting: Internal Medicine

## 2018-04-21 ENCOUNTER — Encounter: Payer: Self-pay | Admitting: Emergency Medicine

## 2018-04-21 DIAGNOSIS — Z79899 Other long term (current) drug therapy: Secondary | ICD-10-CM

## 2018-04-21 DIAGNOSIS — R0600 Dyspnea, unspecified: Secondary | ICD-10-CM | POA: Insufficient documentation

## 2018-04-21 DIAGNOSIS — I10 Essential (primary) hypertension: Secondary | ICD-10-CM | POA: Insufficient documentation

## 2018-04-21 DIAGNOSIS — Z20822 Contact with and (suspected) exposure to covid-19: Secondary | ICD-10-CM | POA: Diagnosis present

## 2018-04-21 DIAGNOSIS — Z20828 Contact with and (suspected) exposure to other viral communicable diseases: Secondary | ICD-10-CM | POA: Insufficient documentation

## 2018-04-21 DIAGNOSIS — F1020 Alcohol dependence, uncomplicated: Secondary | ICD-10-CM | POA: Diagnosis present

## 2018-04-21 DIAGNOSIS — R091 Pleurisy: Secondary | ICD-10-CM | POA: Diagnosis present

## 2018-04-21 DIAGNOSIS — E785 Hyperlipidemia, unspecified: Secondary | ICD-10-CM | POA: Diagnosis present

## 2018-04-21 DIAGNOSIS — R6889 Other general symptoms and signs: Secondary | ICD-10-CM

## 2018-04-21 DIAGNOSIS — J189 Pneumonia, unspecified organism: Principal | ICD-10-CM | POA: Diagnosis present

## 2018-04-21 DIAGNOSIS — J209 Acute bronchitis, unspecified: Secondary | ICD-10-CM | POA: Diagnosis present

## 2018-04-21 LAB — CBC WITH DIFFERENTIAL/PLATELET
Abs Immature Granulocytes: 0.08 10*3/uL — ABNORMAL HIGH (ref 0.00–0.07)
Basophils Absolute: 0 10*3/uL (ref 0.0–0.1)
Basophils Relative: 1 %
Eosinophils Absolute: 0.2 10*3/uL (ref 0.0–0.5)
Eosinophils Relative: 3 %
HCT: 43.3 % (ref 39.0–52.0)
Hemoglobin: 14.8 g/dL (ref 13.0–17.0)
Immature Granulocytes: 1 %
Lymphocytes Relative: 22 %
Lymphs Abs: 1.5 10*3/uL (ref 0.7–4.0)
MCH: 32.7 pg (ref 26.0–34.0)
MCHC: 34.2 g/dL (ref 30.0–36.0)
MCV: 95.8 fL (ref 80.0–100.0)
Monocytes Absolute: 0.5 10*3/uL (ref 0.1–1.0)
Monocytes Relative: 7 %
Neutro Abs: 4.4 10*3/uL (ref 1.7–7.7)
Neutrophils Relative %: 66 %
Platelets: 193 10*3/uL (ref 150–400)
RBC: 4.52 MIL/uL (ref 4.22–5.81)
RDW: 14.4 % (ref 11.5–15.5)
WBC: 6.6 10*3/uL (ref 4.0–10.5)
nRBC: 0 % (ref 0.0–0.2)

## 2018-04-21 LAB — COMPREHENSIVE METABOLIC PANEL
ALT: 27 U/L (ref 0–44)
AST: 20 U/L (ref 15–41)
Albumin: 4.1 g/dL (ref 3.5–5.0)
Alkaline Phosphatase: 83 U/L (ref 38–126)
Anion gap: 8 (ref 5–15)
BUN: 21 mg/dL (ref 8–23)
CO2: 28 mmol/L (ref 22–32)
Calcium: 9.4 mg/dL (ref 8.9–10.3)
Chloride: 102 mmol/L (ref 98–111)
Creatinine, Ser: 0.76 mg/dL (ref 0.61–1.24)
GFR calc Af Amer: >60 mL/min (ref >60)
GFR calc non Af Amer: >60 mL/min (ref >60)
Glucose, Bld: 92 mg/dL (ref 70–99)
Potassium: 4.6 mmol/L (ref 3.5–5.1)
Sodium: 138 mmol/L (ref 135–145)
Total Bilirubin: 1.1 mg/dL (ref 0.3–1.2)
Total Protein: 8 g/dL (ref 6.5–8.1)

## 2018-04-21 LAB — LACTIC ACID, PLASMA: Lactic Acid, Venous: 1.5 mmol/L (ref 0.5–1.9)

## 2018-04-21 LAB — PROCALCITONIN: Procalcitonin: <0.1 ng/mL

## 2018-04-21 LAB — BRAIN NATRIURETIC PEPTIDE: B Natriuretic Peptide: 11 pg/mL (ref 0.0–100.0)

## 2018-04-21 LAB — ETHANOL: Alcohol, Ethyl (B): <10 mg/dL (ref <10)

## 2018-04-21 LAB — TROPONIN I: Troponin I: <0.03 ng/mL (ref <0.03)

## 2018-04-21 MED ORDER — LORAZEPAM 1 MG PO TABS: 0.0000 mg | ORAL_TABLET | Freq: Two times a day (BID) | ORAL | Status: DC

## 2018-04-21 MED ORDER — LORAZEPAM 2 MG/ML IJ SOLN: 0.0000 mg | Freq: Two times a day (BID) | INTRAMUSCULAR | Status: DC

## 2018-04-21 MED ORDER — VITAMIN B-1 100 MG PO TABS: 100.0000 mg | ORAL_TABLET | Freq: Every day | ORAL | Status: DC

## 2018-04-21 MED ORDER — SODIUM CHLORIDE 0.9 % IV SOLN
100.0000 mg | Freq: Two times a day (BID) | INTRAVENOUS | Status: DC
  Administered 2018-04-22 – 2018-04-23 (×4): 100 mg via INTRAVENOUS
  Filled 2018-04-21 (×7): qty 100

## 2018-04-21 MED ORDER — LORAZEPAM 1 MG PO TABS: 0.0000 mg | ORAL_TABLET | Freq: Four times a day (QID) | ORAL | Status: DC

## 2018-04-21 MED ORDER — IPRATROPIUM-ALBUTEROL 20-100 MCG/ACT IN AERS
1.0000 | INHALATION_SPRAY | Freq: Four times a day (QID) | RESPIRATORY_TRACT | Status: DC
  Administered 2018-04-22 – 2018-04-23 (×6): 1 via RESPIRATORY_TRACT
  Filled 2018-04-21 (×2): qty 4

## 2018-04-21 MED ORDER — LORAZEPAM 2 MG/ML IJ SOLN
0.0000 mg | Freq: Four times a day (QID) | INTRAMUSCULAR | Status: DC
  Administered 2018-04-21: 1 mg via INTRAVENOUS
  Filled 2018-04-21: qty 1

## 2018-04-21 MED ORDER — THIAMINE HCL 100 MG/ML IJ SOLN: 100.0000 mg | Freq: Every day | INTRAMUSCULAR | Status: DC

## 2018-04-21 MED ORDER — AZITHROMYCIN 250 MG PO TABS: ORAL_TABLET | ORAL | 0 refills | Status: DC

## 2018-04-21 MED ORDER — ALBUTEROL SULFATE HFA 108 (90 BASE) MCG/ACT IN AERS
2.0000 | INHALATION_SPRAY | RESPIRATORY_TRACT | Status: DC | PRN
  Administered 2018-04-22: 2 via RESPIRATORY_TRACT
  Filled 2018-04-21: qty 6.7

## 2018-04-21 MED ORDER — IPRATROPIUM-ALBUTEROL 0.5-2.5 (3) MG/3ML IN SOLN: 3.0000 mL | RESPIRATORY_TRACT | Status: DC | PRN

## 2018-04-21 MED ORDER — ALBUTEROL SULFATE HFA 108 (90 BASE) MCG/ACT IN AERS: 2.0000 | INHALATION_SPRAY | Freq: Four times a day (QID) | RESPIRATORY_TRACT | 2 refills | Status: DC | PRN

## 2018-04-21 MED ORDER — SODIUM CHLORIDE 0.9 % IV SOLN
1.0000 g | Freq: Once | INTRAVENOUS | Status: AC
  Administered 2018-04-21: 1 g via INTRAVENOUS
  Filled 2018-04-21 (×2): qty 10

## 2018-04-21 MED ORDER — SODIUM CHLORIDE 0.9 % IV SOLN
1.0000 g | INTRAVENOUS | Status: DC
  Administered 2018-04-23: 1 g via INTRAVENOUS
  Filled 2018-04-21: qty 1
  Filled 2018-04-21: qty 10

## 2018-04-21 MED ORDER — LACTATED RINGERS IV SOLN
INTRAVENOUS | Status: AC
  Administered 2018-04-21 – 2018-04-22 (×2): via INTRAVENOUS

## 2018-04-21 MED ORDER — IPRATROPIUM-ALBUTEROL 0.5-2.5 (3) MG/3ML IN SOLN: 3.0000 mL | Freq: Four times a day (QID) | RESPIRATORY_TRACT | Status: DC

## 2018-04-21 MED ORDER — PREDNISONE 20 MG PO TABS
40.0000 mg | ORAL_TABLET | Freq: Every day | ORAL | Status: DC
  Administered 2018-04-23: 40 mg via ORAL
  Filled 2018-04-21: qty 2

## 2018-04-21 MED ORDER — METHYLPREDNISOLONE SODIUM SUCC 125 MG IJ SOLR
60.0000 mg | Freq: Two times a day (BID) | INTRAMUSCULAR | Status: AC
  Administered 2018-04-22 (×2): 60 mg via INTRAVENOUS
  Filled 2018-04-21 (×2): qty 2

## 2018-04-21 MED ORDER — SODIUM CHLORIDE 0.9 % IV SOLN: 1.0000 g | INTRAVENOUS | Status: DC

## 2018-04-21 MED ORDER — ALBUTEROL SULFATE (2.5 MG/3ML) 0.083% IN NEBU: 2.5000 mg | INHALATION_SOLUTION | Freq: Once | RESPIRATORY_TRACT | Status: DC

## 2018-04-21 MED ORDER — METHYLPREDNISOLONE SODIUM SUCC 125 MG IJ SOLR
125.0000 mg | Freq: Once | INTRAMUSCULAR | Status: AC
  Administered 2018-04-21: 125 mg via INTRAVENOUS
  Filled 2018-04-21 (×2): qty 2

## 2018-04-21 NOTE — ED Provider Notes (Signed)
[facility] Emergency Department Provider Note    First MD Initiated Contact with Patient 04/21/18 2138     (approximate)  I have reviewed the triage vital signs and the nursing notes.   HISTORY  Chief Complaint Shortness of Breath    HPI Jason Rentfro. is a 63 y.o. male below listed past medical history as well as chronic hypoxia likely secondary to chronic bronchitis presents the ER for persistent shortness of breath and cough.  Patient was seen in the ER earlier today with recommendation for admission the hospital due to concern for bronchitis possible covid 19.  Patient left AMA but returned as he is having worsening shortness of breath.  States he is also unable to get the prescriptions that he was given filled.  Denies any change in symptoms.    Past Medical History:  Diagnosis Date  . Alcohol abuse    possibly as much as 24 beers per day  . Arthritis   . HTN (hypertension)    Family History  Problem Relation Age of Onset  . Cancer Mother   . Cancer Father    Past Surgical History:  Procedure Laterality Date  . HERNIA REPAIR     Right inguinal  . INTRAVASCULAR PRESSURE WIRE/FFR STUDY N/A 10/28/2016   Procedure: INTRAVASCULAR PRESSURE WIRE/FFR STUDY;  Surgeon: Marykay Lex, MD;  Location: [facility] INVASIVE CV LAB;  Service: Cardiovascular;  Laterality: N/A;  . LEFT HEART CATH AND CORONARY ANGIOGRAPHY N/A 10/28/2016   Procedure: LEFT HEART CATH AND CORONARY ANGIOGRAPHY;  Surgeon: Marykay Lex, MD;  Location: [facility] INVASIVE CV LAB;  Service: Cardiovascular;  Laterality: N/A;   Patient Active Problem List   Diagnosis Date Noted  . Hypoxia 03/21/2018  . Bleach ingestion 08/14/2017  . 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 Substance induced mood disorder (HCC) 08/14/2017  . Alcohol abuse 08/14/2017  . Unstable angina (HCC) 10/22/2016  . Hyperlipidemia 10/22/2016  . Medication management 10/22/2016      Prior to Admission medications   Medication Sig Start Date End Date Taking? Authorizing Provider  albuterol (PROVENTIL HFA;VENTOLIN HFA) 108 (90 Base) MCG/ACT inhaler Inhale 2 puffs into the lungs every 6 (six) hours as needed for wheezing or shortness of breath. 04/21/18   Jeanmarie Plant, MD  azithromycin (ZITHROMAX Z-PAK) 250 MG tablet Take 2 tablets (500 mg) on  Day 1,  followed by 1 tablet (250 mg) once daily on Days 2 through 5. bronchitis 04/21/18 04/26/18  Jeanmarie Plant, MD  isosorbide mononitrate (IMDUR) 30 MG 24 hr tablet Take 30 mg by mouth daily.    [provider]  nitroGLYCERIN (NITROSTAT) 0.4 MG SL tablet Place 1 tablet (0.4 mg total) under the tongue every 5 (five) minutes as needed for chest pain. 10/21/16 04/21/18  Rollene Rotunda, MD    Allergies Patient has no known allergies.    Social History Social History   Tobacco Use  . Smoking status: Never Smoker  . Smokeless tobacco: Never Used  Substance Use Topics  . Alcohol use: Yes    Comment: daily, 4 - 5 beers per day  . Drug use: No    Review of Systems Patient denies headaches, rhinorrhea, blurry vision, numbness, shortness of breath, chest pain, edema, cough, abdominal pain, nausea, vomiting, diarrhea, dysuria, fevers, rashes or hallucinations unless otherwise stated above in HPI. ____________________________________________   PHYSICAL EXAM:  VITAL SIGNS: Vitals:   04/21/18 2107 04/21/18 2124  BP: 115/76  Pulse: (!) 117   Resp: (!) 24   Temp: 97.7 F (36.5 C)   SpO2: 97% 97%    Constitutional: Alert and oriented.  Eyes: Conjunctivae are normal.  Head: Atraumatic. Nose: No congestion/rhinnorhea. Mouth/Throat: Mucous membranes are moist.   Neck: No stridor. Painless ROM.  Cardiovascular: Normal rate, regular rhythm. Grossly normal heart sounds.  Good peripheral circulation. Respiratory: Normal respiratory effort.  No retractions. Lungs with coarse bibasilar breathsounds  Gastrointestinal: Soft and nontender. No distention. No abdominal bruits. No CVA tenderness. Genitourinary:  Musculoskeletal: No lower extremity tenderness nor edema.  No joint effusions. Neurologic:  Normal speech and language. No gross focal neurologic deficits are appreciated. No facial droop Skin:  Skin is warm, dry and intact. No rash noted. Psychiatric: Mood and affect are normal. Speech and behavior are normal.  ____________________________________________   LABS (all labs ordered are listed, but only abnormal results are displayed)  Results for orders placed or performed during the hospital encounter of 04/21/18 (from the past 24 hour(s))  Lactic acid, plasma     Status: None   Collection Time: 04/21/18 11:58 AM  Result Value Ref Range   Lactic Acid, Venous 1.5 0.5 - 1.9 mmol/L  Comprehensive metabolic panel     Status: None   Collection Time: 04/21/18 11:58 AM  Result Value Ref Range   Sodium 138 135 - 145 mmol/L   Potassium 4.6 3.5 - 5.1 mmol/L   Chloride 102 98 - 111 mmol/L   CO2 28 22 - 32 mmol/L   Glucose, Bld 92 70 - 99 mg/dL   BUN 21 8 - 23 mg/dL   Creatinine, Ser 0.98 0.61 - 1.24 mg/dL   Calcium 9.4 8.9 - 11.9 mg/dL   Total Protein 8.0 6.5 - 8.1 g/dL   Albumin 4.1 3.5 - 5.0 g/dL   AST 20 15 - 41 U/L   ALT 27 0 - 44 U/L   Alkaline Phosphatase 83 38 - 126 U/L   Total Bilirubin 1.1 0.3 - 1.2 mg/dL   GFR calc non Af Amer >60 >60 mL/min   GFR calc Af Amer >60 >60 mL/min   Anion gap 8 5 - 15  CBC WITH DIFFERENTIAL     Status: Abnormal   Collection Time: 04/21/18 11:58 AM  Result Value Ref Range   WBC 6.6 4.0 - 10.5 K/uL   RBC 4.52 4.22 - 5.81 MIL/uL   Hemoglobin 14.8 13.0 - 17.0 g/dL   HCT 14.7 82.9 - 56.2 %   MCV 95.8 80.0 - 100.0 fL   MCH 32.7 26.0 - 34.0 pg   MCHC 34.2 30.0 - 36.0 g/dL   RDW 13.0 86.5 - 78.4 %   Platelets 193 150 - 400 K/uL   nRBC 0.0 0.0 - 0.2 %   Neutrophils Relative % 66 %   Neutro Abs 4.4 1.7 - 7.7 K/uL   Lymphocytes Relative 22 %    Lymphs Abs 1.5 0.7 - 4.0 K/uL   Monocytes Relative 7 %   Monocytes Absolute 0.5 0.1 - 1.0 K/uL   Eosinophils Relative 3 %   Eosinophils Absolute 0.2 0.0 - 0.5 K/uL   Basophils Relative 1 %   Basophils Absolute 0.0 0.0 - 0.1 K/uL   Immature Granulocytes 1 %   Abs Immature Granulocytes 0.08 (H) 0.00 - 0.07 K/uL  Procalcitonin     Status: None   Collection Time: 04/21/18 11:58 AM  Result Value Ref Range   Procalcitonin <0.10 ng/mL  Troponin I - ONCE - STAT  Status: None   Collection Time: 04/21/18 11:58 AM  Result Value Ref Range   Troponin I <0.03 <0.03 ng/mL  Brain natriuretic peptide     Status: None   Collection Time: 04/21/18 11:58 AM  Result Value Ref Range   B Natriuretic Peptide 11.0 0.0 - 100.0 pg/mL  Ethanol     Status: None   Collection Time: 04/21/18 11:58 AM  Result Value Ref Range   Alcohol, Ethyl (B) <10 <10 mg/dL   ____________________________________________  ____________________________________________  RADIOLOGY   ____________________________________________   PROCEDURES  Procedure(s) performed:  Procedures    Critical Care performed: no ____________________________________________   INITIAL IMPRESSION / ASSESSMENT AND PLAN / ED COURSE  Pertinent labs & imaging results that were available during my care of the patient were reviewed by me and considered in my medical decision making (see chart for details).   DDX: bronchitis, copd, pna, covid 19  Jason CoveFoster Hocevar Jr. is a 63 y.o. who presents to the ED with sob as described.  Afebrile with mild tachycardia.  Will give solumedrol and albuterol.  Will discuss with hospitalist for admission.Have discussed with the patient and available family all diagnostics and treatments performed thus far and all questions were answered to the best of my ability. The patient demonstrates understanding and agreement with plan.       The patient was evaluated in Emergency Department today for the symptoms  described in the history of present illness. He/she was evaluated in the context of the global COVID-19 pandemic, which necessitated consideration that the patient might be at risk for infection with the SARS-CoV-2 virus that causes COVID-19. Institutional protocols and algorithms that pertain to the evaluation of patients at risk for COVID-19 are in a state of rapid change based on information released by regulatory bodies including the CDC and federal and state organizations. These policies and algorithms were followed during the patient's care in the ED.  As part of my medical decision making, I reviewed the following data within the electronic MEDICAL RECORD NUMBER Nursing notes reviewed and incorporated, Labs reviewed, notes from prior ED visits.   ____________________________________________   FINAL CLINICAL IMPRESSION(S) / ED DIAGNOSES  Final diagnoses:  Acute bronchitis, unspecified organism      NEW MEDICATIONS STARTED DURING THIS VISIT:  New Prescriptions   No medications on file     Note:  This document was prepared using Dragon voice recognition software and may include unintentional dictation errors.    Willy Eddyobinson, Khadeem Rockett, MD 04/21/18 2213

## 2018-04-21 NOTE — ED Triage Notes (Signed)
Pt to ED via POV c/o increased shortness of breath and chest wall pain. Pt has been sick since beginning of March. Pt was admitted for hypoxia and pneumonia. Pt states that he followed up in the ED on 3/21 and was improving. Pt has since gotten worse. Pt states that over the last week he has been more short of breath and is having chest wall pain when breathing. Pt is on chronic O2 at 2 liters via [place]. Pt is tachypneic on assessment.

## 2018-04-21 NOTE — ED Notes (Signed)
Patient seen in ed earlier today with c/o of sob, left AMA, returned for to ed with same c/o reports not feeling any better.vss awaiting admission.

## 2018-04-21 NOTE — BH Assessment (Signed)
Per pt chart pt is being transferred to the medical floor. Pt not yet medically cleared and is not appropriate for assessment at this time.

## 2018-04-21 NOTE — ED Provider Notes (Addendum)
[facility] Emergency Department Provider Note  ____________________________________________   I have reviewed the triage vital signs and the nursing notes. Where available I have reviewed prior notes and, if possible and indicated, outside hospital notes.    HISTORY  Chief Complaint Shortness of Breath    HPI Jason Wiggins. is a 63 y.o. male who presents today complaining of increasing shortness of breath and cough, over the last couple days.  He has not had any alcohol recently.  He states over the last week his shortness of breath getting worse.  He is newly on 2 L of oxygen from his last admission for a community-acquired pneumonia.  He does not have any known coronavirus exposures.  He has no recent travel.  He does not have any nausea or vomiting or diarrhea.  He denies pain in his chest unless he is in the physical active coughing in which case he does have some discomfort to the left chest wall.  It also hurts when he changes position or when he touches it.  He states he is here because he is having increased dyspnea.  No known history of PE or DVT.  Had a recent CT scan.    Past Medical History:  Diagnosis Date  . Alcohol abuse    possibly as much as 24 beers per day  . Arthritis   . HTN (hypertension)     Patient Active Problem List   Diagnosis Date Noted  . Hypoxia 03/21/2018  . Bleach ingestion 08/14/2017  . 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 Substance induced mood disorder (HCC) 08/14/2017  . Alcohol abuse 08/14/2017  . Unstable angina (HCC) 10/22/2016  . Hyperlipidemia 10/22/2016  . Medication management 10/22/2016    Past Surgical History:  Procedure Laterality Date  . HERNIA REPAIR     Right inguinal  . INTRAVASCULAR PRESSURE WIRE/FFR STUDY N/A 10/28/2016   Procedure: INTRAVASCULAR PRESSURE WIRE/FFR STUDY;  Surgeon: Marykay Lex, MD;  Location: [facility] INVASIVE CV LAB;  Service:  Cardiovascular;  Laterality: N/A;  . LEFT HEART CATH AND CORONARY ANGIOGRAPHY N/A 10/28/2016   Procedure: LEFT HEART CATH AND CORONARY ANGIOGRAPHY;  Surgeon: Marykay Lex, MD;  Location: [facility] INVASIVE CV LAB;  Service: Cardiovascular;  Laterality: N/A;    Prior to Admission medications   Medication Sig Start Date End Date Taking? Authorizing Provider  isosorbide mononitrate (IMDUR) 30 MG 24 hr tablet Take 30 mg by mouth daily.   Yes [provider]  nitroGLYCERIN (NITROSTAT) 0.4 MG SL tablet Place 1 tablet (0.4 mg total) under the tongue every 5 (five) minutes as needed for chest pain. 10/21/16 04/21/18 Yes Rollene Rotunda, MD    Allergies Patient has no known allergies.  Family History  Problem Relation Age of Onset  . Cancer Mother   . Cancer Father     Social History Social History   Tobacco Use  . Smoking status: Never Smoker  . Smokeless tobacco: Never Used  Substance Use Topics  . Alcohol use: Yes    Comment: daily, 4 - 5 beers per day  . Drug use: No    Review of Systems Constitutional: No fever/chills Eyes: No visual changes. ENT: No sore throat. No stiff neck no neck pain Cardiovascular: See HPI regarding chest pain Respiratory: + shortness of breath. Gastrointestinal:   no vomiting.  No diarrhea.  No constipation. Genitourinary: Negative for dysuria. Musculoskeletal: Negative lower extremity swelling Skin: Negative for rash. Neurological: Negative for severe  headaches, focal weakness or numbness.   ____________________________________________   PHYSICAL EXAM:  VITAL SIGNS: ED Triage Vitals  Enc Vitals Group     BP 04/21/18 1054 140/79     Pulse Rate 04/21/18 1054 (!) 122     Resp 04/21/18 1050 (!) 22     Temp 04/21/18 1054 97.7 F (36.5 C)     Temp Source 04/21/18 1054 Oral     SpO2 04/21/18 1054 98 %     Weight 04/21/18 1051 238 lb 1.6 oz (108 kg)     Height --      Head Circumference --      Peak Flow --      Pain Score 04/21/18  1051 6     Pain Loc --      Pain Edu? --      Excl. in GC? --     Constitutional: Alert and oriented. Well appearing and in no acute distress. Eyes: Conjunctivae are normal Head: Atraumatic HEENT: No congestion/rhinnorhea. Mucous membranes are moist.  Oropharynx non-erythematous Neck:   Nontender with no meningismus, no masses, no stridor Cardiovascular: Mild tachycardia, heart rate in the low 100s regular rhythm. Grossly normal heart sounds.  Good peripheral circulation. Chest: Tender palpation left chest when I touch his chest wall he states "I expect the pain right there and pulls back no crepitus no flail chest no bruising.  I can also elicit discomfort by having him change position. Respiratory: Decreased in the bases no wheezes or rales.  Slightly increased respiratory rate Abdominal: Soft and nontender. No distention. No guarding no rebound Back:  There is no focal tenderness or step off.  there is no midline tenderness there are no lesions noted. there is no CVA tenderness Musculoskeletal: No lower extremity tenderness, no upper extremity tenderness. No joint effusions, no DVT signs strong distal pulses no edema Neurologic:  Normal speech and language. No gross focal neurologic deficits are appreciated.  Skin:  Skin is warm, dry and intact. No rash noted. Psychiatric: Mood and affect are normal. Speech and behavior are normal.  ____________________________________________   LABS (all labs ordered are listed, but only abnormal results are displayed)  Labs Reviewed  CBC WITH DIFFERENTIAL/PLATELET - Abnormal; Notable for the following components:      Result Value   Abs Immature Granulocytes 0.08 (*)    All other components within normal limits  CULTURE, BLOOD (ROUTINE X 2)  CULTURE, BLOOD (ROUTINE X 2)  NOVEL CORONAVIRUS, NAA (HOSPITAL ORDER, SEND-OUT TO REF LAB)  LACTIC ACID, PLASMA  COMPREHENSIVE METABOLIC PANEL  PROCALCITONIN  TROPONIN I  BRAIN NATRIURETIC PEPTIDE   ETHANOL  LACTIC ACID, PLASMA  URINALYSIS, ROUTINE W REFLEX MICROSCOPIC    Pertinent labs  results that were available during my care of the patient were reviewed by me and considered in my medical decision making (see chart for details). ____________________________________________  EKG  I personally interpreted any EKGs ordered by me or triage Sinus tach rate 121, no acute ST elevation or depression, LAD noted.  No acute ischemia. ____________________________________________  RADIOLOGY  Pertinent labs & imaging results that were available during my care of the patient were reviewed by me and considered in my medical decision making (see chart for details). If possible, patient and/or family made aware of any abnormal findings.  Dg Chest Portable 1 View  Result Date: 04/21/2018 CLINICAL DATA:  Dyspnea, worsening EXAM: PORTABLE CHEST 1 VIEW COMPARISON:  03/21/2018 chest radiograph. FINDINGS: Stable cardiomediastinal silhouette with normal heart size. No pneumothorax.  No pleural effusion. There is patchy opacity throughout both lungs with a peripheral basilar predominance, slightly worsened from prior. IMPRESSION: Patchy opacity throughout both lungs with a peripheral basilar predominance, slightly worsened from prior. Atypical infection including viral pneumonia is on the differential. These results were called by telephone at the time of interpretation on 04/21/2018 at 11:33 am to Dr. Ileana Roup , who verbally acknowledged these results. Electronically Signed   By: Delbert Phenix M.D.   On: 04/21/2018 11:33   ____________________________________________    PROCEDURES  Procedure(s) performed: None  Procedures  Critical Care performed: None  ____________________________________________   INITIAL IMPRESSION / ASSESSMENT AND PLAN / ED COURSE  Pertinent labs & imaging results that were available during my care of the patient were reviewed by me and considered in my medical decision  making (see chart for details).  Patient here for increased cough, pain in his chest wall which is very reproducible with coughing, and somewhat worsening chest x-ray in the context of new oxygen requirement.  I do have concern for possible coronavirus or [employer] pneumonia.  I was admitting the patient to the hospital but at this time I am informed that he is refusing to stay.  We will further discuss with him this.  ----------------------------------------- 2:49 PM on 04/21/2018 -----------------------------------------  Plan the patient the risk including death of departure and he is adamant that he will go I feel he has a capacity to understand the decision-making process return precautions follow-up given understood he understands he can come back in any time if he should choose to.  He does not want a CT scan he would like to go.  He has no other complaints or issues at this time his sats are reassuring and his blood work is reassuring.  He does not want further cardiac enzymes.  He does not want any further work-up he states he does not want a higher bill.  I did explain that we could possibly have Child psychotherapist work with him and he does not want that either at this time.  He just wants to be discharged.  This is the same conversation he had with the hospitalist.  He understands in my opinion the risk benefits and alternatives of t his decision to go home.  We will send him home with albuterol and a Z-Pak and we will see if he can please follow closely up with outpatient I have stressed the need to do so.    ____________________________________________   FINAL CLINICAL IMPRESSION(S) / ED DIAGNOSES  Final diagnoses:  Dyspnea, unspecified type      This chart was dictated using voice recognition software.  Despite best efforts to proofread,  errors can occur which can change meaning.   Jeanmarie Plant, MD 04/21/18 1353    Jeanmarie Plant, MD 04/21/18 1450

## 2018-04-21 NOTE — Consult Note (Signed)
[facility] [place] [facility]   PATIENT NAME: Jason Wiggins    MR#:  [id number]  DATE OF BIRTH:  06/07/1955  DATE OF ADMISSION:  04/21/2018  PRIMARY CARE PHYSICIAN: Patient, No Pcp Per   CONSULT REQUESTING/REFERRING PHYSICIAN: Dr. Alphonzo LemmingsMcShane  REASON FOR CONSULT: SOB  CHIEF COMPLAINT:   Chief Complaint  Patient presents with  . Shortness of Breath    HISTORY OF PRESENT ILLNESS:  Jason Wiggins  is a 63 y.o. male with a known history of hypertension, arthritis, alcohol abuse, recently treated for acute bronchitis and pneumonia returns to the emergency room as his symptoms slowly worsening over the last 8 weeks.  He felt like he improved initially but has not since then.  He tried to see a pulmonologist but tells me that not taking any new patients.  He has also lost his job.  Was placed on 2 L oxygen at home recently. He has a dry cough. No lower extremity edema.  Has chronic orthopnea.  BNP 11 His saturations are stable on 2 L per procalcitonin negative.  Troponin normal.  Normal white count.  Afebrile.  Chest x-ray shows peripheral changes on the lung x-ray concerning for viral infection/bronchitis  PAST MEDICAL HISTORY:   Past Medical History:  Diagnosis Date  . Alcohol abuse    possibly as much as 24 beers per day  . Arthritis   . HTN (hypertension)     PAST SURGICAL HISTOIRY:   Past Surgical History:  Procedure Laterality Date  . HERNIA REPAIR     Right inguinal  . INTRAVASCULAR PRESSURE WIRE/FFR STUDY N/A 10/28/2016   Procedure: INTRAVASCULAR PRESSURE WIRE/FFR STUDY;  Surgeon: Marykay LexHarding, David W, MD;  [facility];  Service: Cardiovascular;  Laterality: N/A;  . LEFT HEART CATH AND CORONARY ANGIOGRAPHY N/A 10/28/2016   Procedure: LEFT HEART CATH AND CORONARY ANGIOGRAPHY;  Surgeon: Marykay LexHarding, David W, MD;  [facility];  Service: Cardiovascular;  Laterality: N/A;    SOCIAL HISTORY:   Social History   Tobacco Use  .  Smoking status: Never Smoker  . Smokeless tobacco: Never Used  Substance Use Topics  . Alcohol use: Yes    Comment: daily, 4 - 5 beers per day    FAMILY HISTORY:   Family History  Problem Relation Age of Onset  . Cancer Mother   . Cancer Father     DRUG ALLERGIES:  No Known Allergies  REVIEW OF SYSTEMS:   ROS  CONSTITUTIONAL: Positive for fatigue EYES: No blurred or double vision.  EARS, NOSE, AND THROAT: No tinnitus or ear pain.  RESPIRATORY: Has cough and shortness of breath CARDIOVASCULAR: No chest pain, orthopnea, edema.  GASTROINTESTINAL: No nausea, vomiting, diarrhea or abdominal pain.  GENITOURINARY: No dysuria, hematuria.  ENDOCRINE: No polyuria, nocturia,  HEMATOLOGY: No anemia, easy bruising or bleeding SKIN: No rash or lesion. MUSCULOSKELETAL: No joint pain or arthritis.   NEUROLOGIC: No tingling, numbness, weakness.  PSYCHIATRY: No anxiety or depression.   MEDICATIONS AT HOME:   Prior to Admission medications   Medication Sig Start Date End Date Taking? Authorizing Provider  isosorbide mononitrate (IMDUR) 30 MG 24 hr tablet Take 30 mg by mouth daily.   Yes [provider]  nitroGLYCERIN (NITROSTAT) 0.4 MG SL tablet Place 1 tablet (0.4 mg total) under the tongue every 5 (five) minutes as needed for chest pain. 10/21/16 04/21/18 Yes Rollene RotundaHochrein, James, MD      VITAL SIGNS:  Blood pressure 140/79, pulse Marland Kitchen(!)  122, temperature 97.7 F (36.5 C), temperature source Oral, resp. rate (!) 22, weight 108 kg, SpO2 98 %.  PHYSICAL EXAMINATION:  GENERAL:  63 y.o.-year-old patient lying in the bed .  Obese EYES: Pupils equal, round, reactive to light and accommodation. No scleral icterus. Extraocular muscles intact.  HEENT: Head atraumatic, normocephalic. Oropharynx and nasopharynx clear.  NECK:  Supple, no jugular venous distention. No thyroid enlargement, no tenderness.  LUNGS: Normal breath sounds bilaterally, no wheezing, rales,rhonchi or crepitation. No use  of accessory muscles of respiration.  CARDIOVASCULAR: S1, S2 normal. No murmurs, rubs, or gallops.  ABDOMEN: Soft, nontender, nondistended. Bowel sounds present. No organomegaly or mass.  EXTREMITIES: No pedal edema, cyanosis, or clubbing.  NEUROLOGIC: Cranial nerves II through XII are intact. Muscle strength 5/5 in all extremities. Sensation intact. Gait not checked.  PSYCHIATRIC: The patient is alert and oriented x 3.  SKIN: No obvious rash, lesion, or ulcer.   LABORATORY PANEL:   CBC Recent Labs  Lab 04/21/18 1158  WBC 6.6  HGB 14.8  HCT 43.3  PLT 193   ------------------------------------------------------------------------------------------------------------------  Chemistries  Recent Labs  Lab 04/21/18 1158  NA 138  K 4.6  CL 102  CO2 28  GLUCOSE 92  BUN 21  CREATININE 0.76  CALCIUM 9.4  AST 20  ALT 27  ALKPHOS 83  BILITOT 1.1   ------------------------------------------------------------------------------------------------------------------  Cardiac Enzymes Recent Labs  Lab 04/21/18 1158  TROPONINI <0.03   ------------------------------------------------------------------------------------------------------------------  RADIOLOGY:  Dg Chest Portable 1 View  Result Date: 04/21/2018 CLINICAL DATA:  Dyspnea, worsening EXAM: PORTABLE CHEST 1 VIEW COMPARISON:  03/21/2018 chest radiograph. FINDINGS: Stable cardiomediastinal silhouette with normal heart size. No pneumothorax. No pleural effusion. There is patchy opacity throughout both lungs with a peripheral basilar predominance, slightly worsened from prior. IMPRESSION: Patchy opacity throughout both lungs with a peripheral basilar predominance, slightly worsened from prior. Atypical infection including viral pneumonia is on the differential. These results were called by telephone at the time of interpretation on 04/21/2018 at 11:33 am to Dr. Ileana Roup , who verbally acknowledged these results. Electronically  Signed   By: Delbert Phenix M.D.   On: 04/21/2018 11:33    EKG:   Orders placed or performed during the hospital encounter of 04/21/18  . EKG 12-Lead  . EKG 12-Lead  . ED EKG 12-Lead  . ED EKG 12-Lead    IMPRESSION AND PLAN:   *Postinfectious reactive airway disease.  X-ray raises concern for possible viral pneumonitis.  COVID-19 test has been sent and pending. At this time patient has been advised for observation admission and further treatment in the hospital.  He refuses admission.  Discussed with Dr. Alphonzo Lemmings of emergency room.  Patient can be discharged home on albuterol inhaler and azithromycin.  He will need to follow-up with his primary care physician in a week.  Advised him to return if any worsening in symptoms.  He has oxygen.  Is afebrile.  Normal WBC.  Requesting to be discharged.  Hypertension.  Continue home medications  All the records are reviewed and case discussed with Consulting provider. Management plans discussed with the patient, family and they are in agreement.  CODE STATUS: Full code  TOTAL TIME TAKING CARE OF THIS PATIENT: 40 minutes.    Orie Fisherman M.D on 04/21/2018 at 2:05 PM  Between 7am to 6pm - Pager - [phone]  After 6pm go to [email or web address] - password [email or web address] [facility]  [facility]  Office  [phone]  CC: Primary care Physician:  Patient, No Pcp Per  Note: This dictation was prepared with Dragon dictation along with smaller phrase technology. Any transcriptional errors that result from this process are unintentional.

## 2018-04-21 NOTE — Discharge Instructions (Signed)
He refused to stay in the hospital this time.  This is certainly you  choice but it does limit our ability to take care of you.  There are risks going home as we discussed this is include even possible significant worsening of your shortness of breath, or death.  If you feel worse in any way including change in your chest pain, shortness of breath, or you change in your mind about getting a further work-up or admission or you have any other concerns please return to the ER.  We did send a coronavirus test on you.  It is not known to be positive yet.  We do ask that you follow closely as an outpatient with primary care doctor listed above or your own.  We ask that you return to the emergency room if you feel worse in any way and please quarantine yourself away from other people until we know that you do not have coronavirus.  You do have coronavirus, there is always a chance that you could get significantly worse and if you do feel worse please return to the ER.  Use her oxygen as prescribed.

## 2018-04-21 NOTE — ED Notes (Signed)
ED TO INPATIENT HANDOFF REPORT  ED Nurse Name and Phone #:  Erie Noe [phone]  S Name/Age/Gender Jason Wiggins. 63 y.o. male Room/Bed: ED30A/ED30A  Code Status   Code Status: Full Code  Home/SNF/Other Home Patient oriented to: self Is this baseline? Yes   Triage Complete: Triage complete  Chief Complaint [facility]  Triage Note Patient was here earlier and was to be admitted for pneumonia but left AMA.  Patient back with increased shortness of breath.   Allergies No Known Allergies  Level of Care/Admitting Diagnosis ED Disposition    ED Disposition Condition Comment   Admit  Hospital Area: [facility] [[id number]]  Level of Care: Telemetry [5]  Diagnosis: Suspected Covid-19 Virus Infection [[id number]]  Admitting Physician: [name] [[id number]]  Attending Physician: [name] [[id number]]  Estimated length of stay: past midnight tomorrow  Certification:: I certify this patient will need inpatient services for at least 2 midnights  PT Class (Do Not Modify): Inpatient [101]  PT Acc Code (Do Not Modify): Private [1]       B Medical/Surgery History Past Medical History:  Diagnosis Date  . Alcohol abuse    possibly as much as 24 beers per day  . Arthritis   . HTN (hypertension)    Past Surgical History:  Procedure Laterality Date  . HERNIA REPAIR     Right inguinal  . INTRAVASCULAR PRESSURE WIRE/FFR STUDY N/A 10/28/2016   Procedure: INTRAVASCULAR PRESSURE WIRE/FFR STUDY;  Surgeon: Marykay Lex, MD;  Location: [facility] INVASIVE CV LAB;  Service: Cardiovascular;  Laterality: N/A;  . LEFT HEART CATH AND CORONARY ANGIOGRAPHY N/A 10/28/2016   Procedure: LEFT HEART CATH AND CORONARY ANGIOGRAPHY;  Surgeon: Marykay Lex, MD;  Location: [facility] INVASIVE CV LAB;  Service: Cardiovascular;  Laterality: N/A;     A IV Location/Drains/Wounds Patient Lines/Drains/Airways Status   Active Line/Drains/Airways    Name:   Placement date:   Placement  time:   Site:   Days:   Peripheral IV 04/21/18 Left Antecubital   04/21/18    2204    Antecubital   less than 1          Intake/Output Last 24 hours No intake or output data in the 24 hours ending 04/21/18 2341  Labs/Imaging Results for orders placed or performed during the hospital encounter of 04/21/18 (from the past 48 hour(s))  Lactic acid, plasma     Status: None   Collection Time: 04/21/18 11:58 AM  Result Value Ref Range   Lactic Acid, Venous 1.5 0.5 - 1.9 mmol/L    Comment: Performed at [facility], [address] [place], [place] [address]  Comprehensive metabolic panel     Status: None   Collection Time: 04/21/18 11:58 AM  Result Value Ref Range   Sodium 138 135 - 145 mmol/L   Potassium 4.6 3.5 - 5.1 mmol/L   Chloride 102 98 - 111 mmol/L   CO2 28 22 - 32 mmol/L   Glucose, Bld 92 70 - 99 mg/dL   BUN 21 8 - 23 mg/dL   Creatinine, Ser 3.24 0.61 - 1.24 mg/dL   Calcium 9.4 8.9 - 40.1 mg/dL   Total Protein 8.0 6.5 - 8.1 g/dL   Albumin 4.1 3.5 - 5.0 g/dL   AST 20 15 - 41 U/L   ALT 27 0 - 44 U/L   Alkaline Phosphatase 83 38 - 126 U/L   Total Bilirubin 1.1 0.3 - 1.2 mg/dL   GFR calc non Af Amer >60 >  60 mL/min   GFR calc Af Amer >60 >60 mL/min   Anion gap 8 5 - 15    Comment: Performed at [facility], [address] [place], [place] [address]  CBC WITH DIFFERENTIAL     Status: Abnormal   Collection Time: 04/21/18 11:58 AM  Result Value Ref Range   WBC 6.6 4.0 - 10.5 K/uL   RBC 4.52 4.22 - 5.81 MIL/uL   Hemoglobin 14.8 13.0 - 17.0 g/dL   HCT 62.143.3 30.839.0 - 65.752.0 %   MCV 95.8 80.0 - 100.0 fL   MCH 32.7 26.0 - 34.0 pg   MCHC 34.2 30.0 - 36.0 g/dL   RDW 84.614.4 96.211.5 - 95.215.5 %   Platelets 193 150 - 400 K/uL   nRBC 0.0 0.0 - 0.2 %   Neutrophils Relative % 66 %   Neutro Abs 4.4 1.7 - 7.7 K/uL   Lymphocytes Relative 22 %   Lymphs Abs 1.5 0.7 - 4.0 K/uL   Monocytes Relative 7 %   Monocytes Absolute 0.5 0.1 - 1.0 K/uL   Eosinophils Relative 3 %    Eosinophils Absolute 0.2 0.0 - 0.5 K/uL   Basophils Relative 1 %   Basophils Absolute 0.0 0.0 - 0.1 K/uL   Immature Granulocytes 1 %   Abs Immature Granulocytes 0.08 (H) 0.00 - 0.07 K/uL    Comment: Performed at [facility], [address] [place], [place] [address]  Procalcitonin     Status: None   Collection Time: 04/21/18 11:58 AM  Result Value Ref Range   Procalcitonin <0.10 ng/mL    Comment:        Interpretation: PCT (Procalcitonin) <= 0.5 ng/mL: Systemic infection (sepsis) is not likely. Local bacterial infection is possible. (NOTE)       Sepsis PCT Algorithm           Lower Respiratory Tract                                      Infection PCT Algorithm    ----------------------------     ----------------------------         PCT < 0.25 ng/mL                PCT < 0.10 ng/mL         Strongly encourage             Strongly discourage   discontinuation of antibiotics    initiation of antibiotics    ----------------------------     -----------------------------       PCT 0.25 - 0.50 ng/mL            PCT 0.10 - 0.25 ng/mL               OR       >80% decrease in PCT            Discourage initiation of                                            antibiotics      Encourage discontinuation           of antibiotics    ----------------------------     -----------------------------         PCT >= 0.50 ng/mL  PCT 0.26 - 0.50 ng/mL               AND        <80% decrease in PCT             Encourage initiation of                                             antibiotics       Encourage continuation           of antibiotics    ----------------------------     -----------------------------        PCT >= 0.50 ng/mL                  PCT > 0.50 ng/mL               AND         increase in PCT                  Strongly encourage                                      initiation of antibiotics    Strongly encourage escalation           of antibiotics                                      -----------------------------                                           PCT <= 0.25 ng/mL                                                 OR                                        > 80% decrease in PCT                                     Discontinue / Do not initiate                                             antibiotics Performed at [facility], [address] [place], [place] [address]   Troponin I - ONCE - STAT     Status: None   Collection Time: 04/21/18 11:58 AM  Result Value Ref Range   Troponin I <0.03 <0.03 ng/mL    Comment: Performed at [facility], [address] [place], [place] [address]  Brain natriuretic peptide     Status: None   Collection Time: 04/21/18 11:58 AM  Result Value Ref  Range   B Natriuretic Peptide 11.0 0.0 - 100.0 pg/mL    Comment: Performed at [facility], [address] [place], [place] [address]  Ethanol     Status: None   Collection Time: 04/21/18 11:58 AM  Result Value Ref Range   Alcohol, Ethyl (B) <10 <10 mg/dL    Comment: (NOTE) Lowest detectable limit for serum alcohol is 10 mg/dL. For medical purposes only. Performed at [facility], [address] [place], [place] [address]    Dg Chest Portable 1 View  Result Date: 04/21/2018 CLINICAL DATA:  Dyspnea, worsening EXAM: PORTABLE CHEST 1 VIEW COMPARISON:  03/21/2018 chest radiograph. FINDINGS: Stable cardiomediastinal silhouette with normal heart size. No pneumothorax. No pleural effusion. There is patchy opacity throughout both lungs with a peripheral basilar predominance, slightly worsened from prior. IMPRESSION: Patchy opacity throughout both lungs with a peripheral basilar predominance, slightly worsened from prior. Atypical infection including viral pneumonia is on the differential. These results were called by telephone at the time of interpretation on 04/21/2018 at 11:33 am to Dr. Ileana Roup , who verbally acknowledged these  results. Electronically Signed   By: Delbert Phenix M.D.   On: 04/21/2018 11:33    Pending Labs Unresulted Labs (From admission, onward)    Start     Ordered   04/21/18 2305  Sedimentation rate  Add-on,   AD     04/21/18 2305   04/21/18 2305  High sensitivity CRP  Add-on,   AD     04/21/18 2305   04/21/18 2305  Fibrin derivatives D-Dimer ([facility] only)  Add-on,   AD     04/21/18 2305   04/21/18 2305  Lactate dehydrogenase  Add-on,   AD     04/21/18 2305   04/21/18 2305  CK  Add-on,   AD     04/21/18 2305   04/21/18 2236  Legionella Pneumophila Serogp 1 Ur Ag  Once,   STAT     04/21/18 2235   04/21/18 2235  Ethanol  Once,   STAT     04/21/18 2231   04/21/18 2235  HIV antibody (Routine Screening)  Once,   STAT     04/21/18 2235   04/21/18 2235  Culture, sputum-assessment  Once,   STAT     04/21/18 2235   04/21/18 2235  Gram stain  Once,   STAT     04/21/18 2235   04/21/18 2235  Strep pneumoniae urinary antigen  Once,   STAT     04/21/18 2235   04/21/18 2232  Magnesium  Once,   STAT     04/21/18 2231   04/21/18 2232  Phosphorus  Once,   STAT     04/21/18 2231   04/21/18 2225  Urinalysis, Routine w reflex microscopic  Once,   STAT     04/21/18 2224   04/21/18 2225  Urine Drug Screen, Qualitative ([facility] only)  Once,   STAT     04/21/18 2224          Vitals/Pain Today's Vitals   04/21/18 2107 04/21/18 2108 04/21/18 2124 04/21/18 2306  BP: 115/76   (!) 148/90  Pulse: (!) 117   (!) 122  Resp: (!) 24     Temp: 97.7 F (36.5 C)     SpO2: 97%  97%   PainSc:  8       Isolation Precautions Droplet and Contact precautions  Medications Medications  LORazepam (ATIVAN) injection 0-4 mg (1 mg Intravenous Given 04/21/18 2310)  Or  LORazepam (ATIVAN) tablet 0-4 mg ( Oral See Alternative 04/21/18 2310)  LORazepam (ATIVAN) injection 0-4 mg (has no administration in time range)    Or  LORazepam (ATIVAN) tablet 0-4 mg (has no administration in time range)  thiamine (VITAMIN B-1)  tablet 100 mg (has no administration in time range)    Or  thiamine (B-1) injection 100 mg (has no administration in time range)  lactated ringers infusion ( Intravenous New Bag/Given 04/21/18 2311)  doxycycline (VIBRAMYCIN) 100 mg in sodium chloride 0.9 % 250 mL IVPB (has no administration in time range)  cefTRIAXone (ROCEPHIN) 1 g in sodium chloride 0.9 % 100 mL IVPB (has no administration in time range)  methylPREDNISolone sodium succinate (SOLU-MEDROL) 125 mg/2 mL injection 60 mg (has no administration in time range)    Followed by  predniSONE (DELTASONE) tablet 40 mg (has no administration in time range)  Ipratropium-Albuterol (COMBIVENT) respimat 1 puff (has no administration in time range)  albuterol (PROVENTIL HFA;VENTOLIN HFA) 108 (90 Base) MCG/ACT inhaler 2 puff (has no administration in time range)  methylPREDNISolone sodium succinate (SOLU-MEDROL) 125 mg/2 mL injection 125 mg (125 mg Intravenous Given 04/21/18 2208)  cefTRIAXone (ROCEPHIN) 1 g in sodium chloride 0.9 % 100 mL IVPB (1 g Intravenous New Bag/Given 04/21/18 2303)    Mobility walks Low fall risk   Focused Assessments Pulmonary Assessment Handoff:  Lung sounds:   O2 Device: Nasal Cannula O2 Flow Rate (L/min): 2 L/min      R Recommendations: See Admitting Provider Note  Report given to:   Additional Notes:

## 2018-04-21 NOTE — ED Notes (Signed)
Vanessa RN, aware of bed assigned  

## 2018-04-21 NOTE — ED Triage Notes (Signed)
Patient was here earlier and was to be admitted for pneumonia but left AMA.  Patient back with increased shortness of breath.

## 2018-04-22 ENCOUNTER — Encounter: Payer: Self-pay | Admitting: *Deleted

## 2018-04-22 ENCOUNTER — Other Ambulatory Visit: Payer: Self-pay

## 2018-04-22 LAB — URINALYSIS, ROUTINE W REFLEX MICROSCOPIC
Bilirubin Urine: NEGATIVE
Glucose, UA: NEGATIVE mg/dL
Hgb urine dipstick: NEGATIVE
Ketones, ur: NEGATIVE mg/dL
Leukocytes,Ua: NEGATIVE
Nitrite: NEGATIVE
Protein, ur: NEGATIVE mg/dL
Specific Gravity, Urine: 1.016 (ref 1.005–1.030)
pH: 6 (ref 5.0–8.0)

## 2018-04-22 LAB — URINE DRUG SCREEN, QUALITATIVE (ARMC ONLY)
Amphetamines, Ur Screen: NOT DETECTED
Barbiturates, Ur Screen: NOT DETECTED
Benzodiazepine, Ur Scrn: NOT DETECTED
Cannabinoid 50 Ng, Ur ~~LOC~~: NOT DETECTED
Cocaine Metabolite,Ur ~~LOC~~: NOT DETECTED
MDMA (Ecstasy)Ur Screen: NOT DETECTED
Methadone Scn, Ur: NOT DETECTED
Opiate, Ur Screen: NOT DETECTED
Phencyclidine (PCP) Ur S: NOT DETECTED
Tricyclic, Ur Screen: NOT DETECTED

## 2018-04-22 LAB — LACTATE DEHYDROGENASE: LDH: 153 U/L (ref 98–192)

## 2018-04-22 LAB — CK: Total CK: 122 U/L (ref 49–397)

## 2018-04-22 LAB — ETHANOL: Alcohol, Ethyl (B): 34 mg/dL — ABNORMAL HIGH (ref <10)

## 2018-04-22 LAB — STREP PNEUMONIAE URINARY ANTIGEN: Strep Pneumo Urinary Antigen: NEGATIVE

## 2018-04-22 LAB — PHOSPHORUS: Phosphorus: 1.9 mg/dL — ABNORMAL LOW (ref 2.5–4.6)

## 2018-04-22 LAB — MAGNESIUM: Magnesium: 2.3 mg/dL (ref 1.7–2.4)

## 2018-04-22 LAB — SEDIMENTATION RATE: Sed Rate: 11 mm/hr (ref 0–20)

## 2018-04-22 LAB — FIBRIN DERIVATIVES D-DIMER (ARMC ONLY): Fibrin derivatives D-dimer (ARMC): 678.58 ng/mL (FEU) — ABNORMAL HIGH (ref 0.00–499.00)

## 2018-04-22 MED ORDER — VITAMIN B-1 100 MG PO TABS
100.0000 mg | ORAL_TABLET | Freq: Every day | ORAL | Status: DC
  Administered 2018-04-22 – 2018-04-23 (×2): 100 mg via ORAL
  Filled 2018-04-22 (×2): qty 1

## 2018-04-22 MED ORDER — FOLIC ACID 1 MG PO TABS
1.0000 mg | ORAL_TABLET | Freq: Every day | ORAL | Status: DC
  Administered 2018-04-22 – 2018-04-23 (×2): 1 mg via ORAL
  Filled 2018-04-22 (×2): qty 1

## 2018-04-22 MED ORDER — LORAZEPAM 2 MG/ML IJ SOLN: 1.0000 mg | Freq: Four times a day (QID) | INTRAMUSCULAR | Status: DC | PRN

## 2018-04-22 MED ORDER — ONDANSETRON HCL 4 MG/2ML IJ SOLN: 4.0000 mg | Freq: Four times a day (QID) | INTRAMUSCULAR | Status: DC | PRN

## 2018-04-22 MED ORDER — BISACODYL 5 MG PO TBEC: 5.0000 mg | DELAYED_RELEASE_TABLET | Freq: Every day | ORAL | Status: DC | PRN

## 2018-04-22 MED ORDER — LORAZEPAM 2 MG/ML IJ SOLN
0.0000 mg | Freq: Four times a day (QID) | INTRAMUSCULAR | Status: DC
  Administered 2018-04-22: 2 mg via INTRAVENOUS
  Filled 2018-04-22: qty 1

## 2018-04-22 MED ORDER — ADULT MULTIVITAMIN W/MINERALS CH
1.0000 | ORAL_TABLET | Freq: Every day | ORAL | Status: DC
  Administered 2018-04-22 – 2018-04-23 (×2): 1 via ORAL
  Filled 2018-04-22 (×2): qty 1

## 2018-04-22 MED ORDER — ISOSORBIDE MONONITRATE ER 30 MG PO TB24
30.0000 mg | ORAL_TABLET | Freq: Every day | ORAL | Status: DC
  Administered 2018-04-22 – 2018-04-23 (×2): 30 mg via ORAL
  Filled 2018-04-22 (×2): qty 1

## 2018-04-22 MED ORDER — ONDANSETRON HCL 4 MG PO TABS: 4.0000 mg | ORAL_TABLET | Freq: Four times a day (QID) | ORAL | Status: DC | PRN

## 2018-04-22 MED ORDER — LORAZEPAM 1 MG PO TABS
1.0000 mg | ORAL_TABLET | Freq: Four times a day (QID) | ORAL | Status: DC | PRN
  Administered 2018-04-23: 1 mg via ORAL
  Filled 2018-04-22: qty 1

## 2018-04-22 MED ORDER — LORAZEPAM 2 MG/ML IJ SOLN: 0.0000 mg | Freq: Two times a day (BID) | INTRAMUSCULAR | Status: DC

## 2018-04-22 MED ORDER — K PHOS MONO-SOD PHOS DI & MONO 155-852-130 MG PO TABS
500.0000 mg | ORAL_TABLET | Freq: Three times a day (TID) | ORAL | Status: DC
  Administered 2018-04-22 – 2018-04-23 (×4): 500 mg via ORAL
  Filled 2018-04-22 (×6): qty 2

## 2018-04-22 MED ORDER — THIAMINE HCL 100 MG/ML IJ SOLN: 100.0000 mg | Freq: Every day | INTRAMUSCULAR | Status: DC

## 2018-04-22 MED ORDER — ENOXAPARIN SODIUM 40 MG/0.4ML ~~LOC~~ SOLN
40.0000 mg | SUBCUTANEOUS | Status: DC
  Administered 2018-04-22 – 2018-04-23 (×2): 40 mg via SUBCUTANEOUS
  Filled 2018-04-22 (×2): qty 0.4

## 2018-04-22 MED ORDER — SENNOSIDES-DOCUSATE SODIUM 8.6-50 MG PO TABS: 1.0000 | ORAL_TABLET | Freq: Every evening | ORAL | Status: DC | PRN

## 2018-04-22 NOTE — Plan of Care (Signed)
  Problem: Coping: Goal: Level of anxiety will decrease Outcome: Completed/Met   Problem: Pain Managment: Goal: General experience of comfort will improve Outcome: Completed/Met   Problem: Safety: Goal: Ability to remain free from injury will improve Outcome: Completed/Met

## 2018-04-22 NOTE — Progress Notes (Signed)
[facility] [place] at [facility]   PATIENT NAME: Jason Wiggins    MR#:  [id number]  DATE OF BIRTH:  08/31/1955  SUBJECTIVE:  CHIEF COMPLAINT: Patient feels better shortness of breath and cough are improving  REVIEW OF SYSTEMS:  CONSTITUTIONAL: No fever, fatigue or weakness.  EYES: No blurred or double vision.  EARS, NOSE, AND THROAT: No tinnitus or ear pain.  RESPIRATORY: Intermittent cough, shortness of breath improving, no wheezing or hemoptysis.  CARDIOVASCULAR: No chest pain, orthopnea, edema.  GASTROINTESTINAL: No nausea, vomiting, diarrhea or abdominal pain.  GENITOURINARY: No dysuria, hematuria.  ENDOCRINE: No polyuria, nocturia,  HEMATOLOGY: No anemia, easy bruising or bleeding SKIN: No rash or lesion. MUSCULOSKELETAL: No joint pain or arthritis.   NEUROLOGIC: No tingling, numbness, weakness.  PSYCHIATRY: No anxiety or depression.   DRUG ALLERGIES:  No Known Allergies  VITALS:  Blood pressure 131/80, pulse 87, temperature 97.9 F (36.6 C), temperature source Oral, resp. rate 20, height 6\' 1"  (1.854 m), weight 116.6 kg, SpO2 99 %.  PHYSICAL EXAMINATION:  GENERAL:  63 y.o.-year-old patient lying in the bed with no acute distress.  EYES: Pupils equal, round, reactive to light and accommodation. No scleral icterus. Extraocular muscles intact.  HEENT: Head atraumatic, normocephalic. Oropharynx and nasopharynx clear.  NECK:  Supple, no jugular venous distention. No thyroid enlargement, no tenderness.  LUNGS: Moderate breath sounds bilaterally, no wheezing, rales,rhonchi or crepitation. No use of accessory muscles of respiration.  CARDIOVASCULAR: S1, S2 normal. No murmurs, rubs, or gallops.  ABDOMEN: Soft, nontender, nondistended. Bowel sounds present.  EXTREMITIES: No pedal edema, cyanosis, or clubbing.  NEUROLOGIC: Awake, alert and oriented x3 sensation intact. Gait not checked.  PSYCHIATRIC: The patient is alert and oriented x 3.  SKIN: No  obvious rash, lesion, or ulcer.    LABORATORY PANEL:   CBC Recent Labs  Lab 04/21/18 1158  WBC 6.6  HGB 14.8  HCT 43.3  PLT 193   ------------------------------------------------------------------------------------------------------------------  Chemistries  Recent Labs  Lab 04/21/18 1158 04/22/18 0153  NA 138  --   K 4.6  --   CL 102  --   CO2 28  --   GLUCOSE 92  --   BUN 21  --   CREATININE 0.76  --   CALCIUM 9.4  --   MG  --  2.3  AST 20  --   ALT 27  --   ALKPHOS 83  --   BILITOT 1.1  --    ------------------------------------------------------------------------------------------------------------------  Cardiac Enzymes Recent Labs  Lab 04/21/18 1158  TROPONINI <0.03   ------------------------------------------------------------------------------------------------------------------  RADIOLOGY:  Dg Chest Portable 1 View  Result Date: 04/21/2018 CLINICAL DATA:  Dyspnea, worsening EXAM: PORTABLE CHEST 1 VIEW COMPARISON:  03/21/2018 chest radiograph. FINDINGS: Stable cardiomediastinal silhouette with normal heart size. No pneumothorax. No pleural effusion. There is patchy opacity throughout both lungs with a peripheral basilar predominance, slightly worsened from prior. IMPRESSION: Patchy opacity throughout both lungs with a peripheral basilar predominance, slightly worsened from prior. Atypical infection including viral pneumonia is on the differential. These results were called by telephone at the time of interpretation on 04/21/2018 at 11:33 am to Dr. Ileana RoupJAMES MCSHANE , who verbally acknowledged these results. Electronically Signed   By: Delbert PhenixJason A Poff M.D.   On: 04/21/2018 11:33    EKG:   Orders placed or performed during the hospital encounter of 04/21/18  . EKG 12-Lead  . EKG 12-Lead  . ED EKG 12-Lead  . ED EKG 12-Lead  ASSESSMENT AND PLAN:   Pneumonia Clinically feeling better Rocephin and doxycycline COVID -pending  Bronchoconstriction from  pneumonia IV Solu-Medrol, bronchodilator treatment  Hyperlipidemia-not on any home meds check fasting lipid panel  Alcohol dependency outpatient alcohol Anonymous CIWA  All the records are reviewed and case discussed with Care Management/Social Workerr. Management plans discussed with the patient, family and they are in agreement.  CODE STATUS: FC  TOTAL TIME TAKING CARE OF THIS PATIENT: 35 minutes.   POSSIBLE D/C IN 1-2 DAYS, DEPENDING ON CLINICAL CONDITION.  Note: This dictation was prepared with Dragon dictation along with smaller phrase technology. Any transcriptional errors that result from this process are unintentional.   Ramonita Lab M.D on 04/22/2018 at 2:00 PM  Between 7am to 6pm - Pager - [phone] After 6pm go to [email or web address] - password [email or web address]  [facility]  Office  [phone]  CC: Primary care physician; Patient, No Pcp Per

## 2018-04-22 NOTE — H&P (Signed)
[facility] [place] at [facility]   PATIENT NAME: Jason Wiggins    MR#:  [id number]  DATE OF BIRTH:  01/14/1955  DATE OF ADMISSION:  04/21/2018  PRIMARY CARE PHYSICIAN: Patient, No Pcp Per   REQUESTING/REFERRING PHYSICIAN: Willy Eddyobinson, Patrick, MD  CHIEF COMPLAINT:   Chief Complaint  Patient presents with  . Shortness of Breath    HISTORY OF PRESENT ILLNESS:  Jason DittyFoster Dethlefs  is a 63 y.o. male with a known history of EtOH use, HTN, DJD/OA p/w cough/SOB. He is highly irate/agitated, and does not provide historical details. [facility] Saturday [facility]/21/2018 afternoon w/ c/o cough/SOB. There was concern for acute viral bronchitis +/- pneumonia +/- COVID-19. Testing was sent, but patient absolutely refused admission. He was prescribed albuterol and Azithromycin. He says he went to his sister's house, where he is staying with his sister, another adult and a child. His sister scolded him. He came back to the ED. He asserts that he does not have COVID-19. He endorses continued/worsening cough, SOB and pleurisy.  Pt then states that he was recently hospitalized for 2d (per records, 03/21/2018-03/23/2018). He says he had pneumonia, and was D/Ced home on O2. He says he took ABx after going home, and was improving while he was taking them, but then got worse again once he ran out of ABx. He says he has been using 2L  O2 as prescribed. 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  Afebrile, (-) leukocytosis. Tachypnoeic, tachycardic, SIRS (+). CXR (+) patchy infiltrates/opacities. Lactate 1.5, PCT < 0.10. Suspect sepsis 2/2 viral pneumonia vs. viral bronchitis. I am told pt was wheezing on ED arrival; (-) wheezing at the time of my exam, though pt exhibits poor air movement. He says he does not smoke, but says his entire family did when he was growing up.  Says he drinks 6 beers per week.  PAST MEDICAL HISTORY:    Past Medical History:  Diagnosis Date  . Alcohol abuse    possibly as much as 24 beers per day  . Arthritis   . HTN (hypertension)    PAST SURGICAL HISTORY:   Past Surgical History:  Procedure Laterality Date  . HERNIA REPAIR     Right inguinal  . INTRAVASCULAR PRESSURE WIRE/FFR STUDY N/A 10/28/2016   Procedure: INTRAVASCULAR PRESSURE WIRE/FFR STUDY;  Surgeon: Marykay LexHarding, David W, MD;  [facility];  Service: Cardiovascular;  Laterality: N/A;  . LEFT HEART CATH AND CORONARY ANGIOGRAPHY N/A 10/28/2016   Procedure: LEFT HEART CATH AND CORONARY ANGIOGRAPHY;  Surgeon: Marykay LexHarding, David W, MD;  [facility];  Service: Cardiovascular;  Laterality: N/A;    SOCIAL HISTORY:   Social History   Tobacco Use  . Smoking status: Never Smoker  . Smokeless tobacco: Never Used  Substance Use Topics  . Alcohol use: Yes    Comment: daily, 4 - 5 beers per day    FAMILY HISTORY:   Family History  Problem Relation Age of Onset  . Cancer Mother   . Cancer Father     DRUG ALLERGIES:  No Known Allergies  REVIEW OF SYSTEMS:   Review of Systems  Constitutional: Negative for chills, diaphoresis, fever, malaise/fatigue and weight loss.  HENT: Negative for congestion, ear pain, hearing loss, nosebleeds, sinus pain, sore throat and tinnitus.   Eyes: Negative for blurred vision and double vision.  Respiratory: Positive for cough,  sputum production, shortness of breath and wheezing. Negative for hemoptysis.   Cardiovascular: Positive for chest pain (Pleurisy w/ deep inspiration) and leg swelling. Negative for palpitations, orthopnea, claudication and PND.  Gastrointestinal: Negative for abdominal pain, blood in stool, constipation, diarrhea, heartburn, melena, nausea and vomiting.  Genitourinary: Negative for dysuria, frequency, hematuria and urgency.  Musculoskeletal: Negative for back pain, falls, joint pain, myalgias and neck pain.  Skin: Negative for itching and  rash.  Neurological: Negative for dizziness, tingling, tremors, sensory change, speech change, focal weakness, seizures, loss of consciousness, weakness and headaches.  Psychiatric/Behavioral: Negative for depression and memory loss. The patient is not nervous/anxious and does not have insomnia.    MEDICATIONS AT HOME:   Prior to Admission medications   Medication Sig Start Date End Date Taking? Authorizing Provider  albuterol (PROVENTIL HFA;VENTOLIN HFA) 108 (90 Base) MCG/ACT inhaler Inhale 2 puffs into the lungs every 6 (six) hours as needed for wheezing or shortness of breath. 04/21/18  Yes Jeanmarie Plant, MD  azithromycin (ZITHROMAX Z-PAK) 250 MG tablet Take 2 tablets (500 mg) on  Day 1,  followed by 1 tablet (250 mg) once daily on Days 2 through 5. bronchitis 04/21/18 04/26/18 Yes McShane, Rudy Jew, MD  isosorbide mononitrate (IMDUR) 30 MG 24 hr tablet Take 30 mg by mouth daily.   Yes [provider]  nitroGLYCERIN (NITROSTAT) 0.4 MG SL tablet Place 1 tablet (0.4 mg total) under the tongue every 5 (five) minutes as needed for chest pain. 10/21/16 04/21/18 Yes Rollene Rotunda, MD    VITAL SIGNS:  Blood pressure 115/81, pulse 98, temperature 98.6 F (37 C), temperature source Oral, resp. rate 18, height  (1.854 m), weight 116.6 kg, SpO2 96 %.  PHYSICAL EXAMINATION:  Physical Exam Constitutional:      General: He is awake. He is not in acute distress.    Appearance: He is well-developed. He is not toxic-appearing.     Interventions: He is not intubated.Nasal cannula in place.  HENT:     Head: Atraumatic.     Mouth/Throat:     Pharynx: Oropharynx is clear.  Eyes:     General: No scleral icterus.    Extraocular Movements: Extraocular movements intact.     Conjunctiva/sclera: Conjunctivae normal.  Neck:     Musculoskeletal: Neck supple.  Cardiovascular:     Rate and Rhythm: Regular rhythm. Tachycardia present.     Heart sounds: Normal heart sounds. No murmur. No friction  rub. No gallop.   Pulmonary:     Effort: Tachypnea present. No bradypnea, accessory muscle usage, respiratory distress or retractions. He is not intubated.     Breath sounds: Decreased air movement present. No stridor or transmitted upper airway sounds. Examination of the right-upper field reveals decreased breath sounds. Examination of the left-upper field reveals decreased breath sounds. Examination of the right-middle field reveals decreased breath sounds. Examination of the left-middle field reveals decreased breath sounds. Examination of the right-lower field reveals decreased breath sounds. Examination of the left-lower field reveals decreased breath sounds. Decreased breath sounds present. No wheezing, rhonchi or rales.  Abdominal:     General: Bowel sounds are normal. There is no distension.     Palpations: Abdomen is soft.     Tenderness: There is no abdominal tenderness. There is no guarding or rebound.  Musculoskeletal: Normal range of motion.        General: No swelling or tenderness.     Right lower leg: No edema.     Left  lower leg: No edema.  Lymphadenopathy:     Cervical: No cervical adenopathy.  Skin:    General: Skin is warm and dry.     Findings: No erythema or rash.  Neurological:     General: No focal deficit present.     Mental Status: He is alert. Mental status is at baseline.  Psychiatric:        Attention and Perception: Attention and perception normal.        Mood and Affect: Mood is anxious. Affect is angry.        Speech: Speech normal.        Behavior: Behavior is agitated. Behavior is not aggressive, hyperactive or combative.        Thought Content: Thought content normal.        Cognition and Memory: Cognition and memory normal.        Judgment: Judgment normal.    LABORATORY PANEL:   CBC Recent Labs  Lab 04/21/18 1158  WBC 6.6  HGB 14.8  HCT 43.3  PLT 193    ------------------------------------------------------------------------------------------------------------------  Chemistries  Recent Labs  Lab 04/21/18 1158 04/22/18 0153  NA 138  --   K 4.6  --   CL 102  --   CO2 28  --   GLUCOSE 92  --   BUN 21  --   CREATININE 0.76  --   CALCIUM 9.4  --   MG  --  2.3  AST 20  --   ALT 27  --   ALKPHOS 83  --   BILITOT 1.1  --    ------------------------------------------------------------------------------------------------------------------  Cardiac Enzymes Recent Labs  Lab 04/21/18 1158  TROPONINI <0.03   ------------------------------------------------------------------------------------------------------------------  RADIOLOGY:  Dg Chest Portable 1 View  Result Date: 04/21/2018 CLINICAL DATA:  Dyspnea, worsening EXAM: PORTABLE CHEST 1 VIEW COMPARISON:  03/21/2018 chest radiograph. FINDINGS: Stable cardiomediastinal silhouette with normal heart size. No pneumothorax. No pleural effusion. There is patchy opacity throughout both lungs with a peripheral basilar predominance, slightly worsened from prior. IMPRESSION: Patchy opacity throughout both lungs with a peripheral basilar predominance, slightly worsened from prior. Atypical infection including viral pneumonia is on the differential. These results were called by telephone at the time of interpretation on 04/21/2018 at 11:33 am to Dr. Ileana Roup , who verbally acknowledged these results. Electronically Signed   By: Delbert Phenix M.D.   On: 04/21/2018 11:33   IMPRESSION AND PLAN:   A/P: 46W w/ PMHx EtOH use, HTN, DJD/OA p/w cough/SOB. SIRS (+). Acute viral bronchitis vs. viral pneumonia +/- COVID-19. Hypophosphatemia. -Cough, SOB, SIRS, viral bronchitis vs. pneumonia: Afebrile, (-) leukocytosis. Tachypnoeic, tachycardic, SIRS (+). CXR (+) patchy infiltrates/opacities. Lactate 1.5, PCT < 0.10. Suspect sepsis 2/2 viral pneumonia vs. viral bronchitis. Acute bacterial pneumonia is also  included in the differential. Ceftriaxone + Doxycycline. COVID-19, respiratory viral panel, UStrep + ULeg Ag pending. Likely undiagnosed obstructive lung disease, Hx secondhand smoke. Reportedly wheezing in ED, diminished w/ poor air movement at the time of my exam. Nebs, steroids, O2, pulse ox, incentive spirometry, pulmonary toileting. -Hypophosphatemia: Phos 1.9. Neutra-Phos TID. Replete and monitor. -EtOH use: Claims he drinks 6 beers per week. Sure. CIWA, Ativan PRN. Thiamine, folate, MVI. -c/w home meds. -FEN/GI: Regular diet. -DVT PPx: Lovenox. -Code status: Full code. -Disposition: Admission, > 2 midnights.   All the records are reviewed and case discussed with ED provider. Management plans discussed with the patient, family and they are in agreement.  CODE STATUS: Full code.  TOTAL TIME  TAKING CARE OF THIS PATIENT: 75 minutes.    [name] M.D on 04/22/2018 at 3:59 AM  Between 7am to 6pm - Pager - [phone]  After 6pm go to [email or web address] - Social research officer, government  [facility]  Office  [phone]  CC: Primary care physician; Patient, No Pcp Per   Note: This dictation was prepared with Dragon dictation along with smaller phrase technology. Any transcriptional errors that result from this process are unintentional.

## 2018-04-23 LAB — LIPID PANEL
Cholesterol: 225 mg/dL — ABNORMAL HIGH (ref 0–200)
HDL: 57 mg/dL (ref >40)
LDL Cholesterol: 148 mg/dL — ABNORMAL HIGH (ref 0–99)
Total CHOL/HDL Ratio: 3.9 RATIO
Triglycerides: 98 mg/dL (ref <150)
VLDL: 20 mg/dL (ref 0–40)

## 2018-04-23 LAB — NOVEL CORONAVIRUS, NAA (HOSP ORDER, SEND-OUT TO REF LAB; TAT 18-24 HRS): SARS-CoV-2, NAA: NOT DETECTED

## 2018-04-23 MED ORDER — THIAMINE HCL 100 MG PO TABS: 100.0000 mg | ORAL_TABLET | Freq: Every day | ORAL | 0 refills | Status: DC

## 2018-04-23 MED ORDER — PREDNISONE 10 MG (21) PO TBPK: 10.0000 mg | ORAL_TABLET | Freq: Every day | ORAL | 0 refills | Status: DC

## 2018-04-23 MED ORDER — DOXYCYCLINE HYCLATE 100 MG PO TABS: 100.0000 mg | ORAL_TABLET | Freq: Two times a day (BID) | ORAL | Status: DC

## 2018-04-23 MED ORDER — DOXYCYCLINE HYCLATE 100 MG PO TABS: 100.0000 mg | ORAL_TABLET | Freq: Two times a day (BID) | ORAL | 0 refills | Status: DC

## 2018-04-23 MED ORDER — ADULT MULTIVITAMIN W/MINERALS CH: 1.0000 | ORAL_TABLET | Freq: Every day | ORAL | Status: DC

## 2018-04-23 MED ORDER — FOLIC ACID 1 MG PO TABS: 1.0000 mg | ORAL_TABLET | Freq: Every day | ORAL | 0 refills | Status: DC

## 2018-04-23 MED ORDER — IPRATROPIUM-ALBUTEROL 20-100 MCG/ACT IN AERS: 1.0000 | INHALATION_SPRAY | Freq: Four times a day (QID) | RESPIRATORY_TRACT | 0 refills | Status: DC

## 2018-04-23 MED ORDER — AMOXICILLIN-POT CLAVULANATE 875-125 MG PO TABS: 1.0000 | ORAL_TABLET | Freq: Two times a day (BID) | ORAL | 0 refills | Status: DC

## 2018-04-23 MED ORDER — K PHOS MONO-SOD PHOS DI & MONO 155-852-130 MG PO TABS: 500.0000 mg | ORAL_TABLET | Freq: Two times a day (BID) | ORAL | 0 refills | Status: DC

## 2018-04-23 NOTE — Discharge Summary (Signed)
[facility] - [place] at [facility]   PATIENT NAME: Jason Wiggins    MR#:  [id number]  DATE OF BIRTH:  Nov 02, 1955  DATE OF ADMISSION:  04/21/2018 ADMITTING PHYSICIAN: [name], MD  DATE OF DISCHARGE: 04/23/18   PRIMARY CARE PHYSICIAN: Patient, No Pcp Per    ADMISSION DIAGNOSIS:  Acute bronchitis, unspecified organism [J20.9] Suspected Covid-19 Virus Infection [R68.89]  DISCHARGE DIAGNOSIS:  Active Problems:   Suspected Covid-19 Virus Infection   SECONDARY DIAGNOSIS:   Past Medical History:  Diagnosis Date  . Alcohol abuse    possibly as much as 24 beers per day  . Arthritis   . HTN (hypertension)     HOSPITAL COURSE:  HPI  Jason Wiggins  is a 63 y.o. male with a known history of EtOH use, HTN, DJD/OA p/w cough/SOB. He is highly irate/agitated, and does not provide historical details. Pt came to the [facility] ED on Saturday 04/21/2018 afternoon w/ c/o cough/SOB. There was concern for acute viral bronchitis +/- pneumonia +/- COVID-19. Testing was sent, but patient absolutely refused admission. He was prescribed albuterol and Azithromycin. He says he went to his sister's house, where he is staying with his sister, another adult and a child. His sister scolded him. He came back to the ED. He asserts that he does not have COVID-19. He endorses continued/worsening cough, SOB and pleurisy.  Pt then states that he was recently hospitalized for 2d (per records, 03/21/2018-03/23/2018). He says he had pneumonia, and was D/Ced home on O2. He says he took ABx after going home, and was improving while he was taking them, but then got worse again once he ran out of ABx. He says he has been using 2L [place] O2 as prescribed. 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   Pneumonia Clinically feeling better Rocephin and doxycycline.  Discharge patient with the doxycycline and Augmentin COVID  -negative  Bronchoconstriction from pneumonia IV Solu-Medrol, bronchodilator treatment given and patient significantly improved clinically Discharged with prednisone tapering and bronchodilator inhalers  Hyperlipidemia-not on any home meds check fasting lipid panel LDL 148 start Lipitor  Alcohol dependency outpatient alcohol Anonymous CIWA  Discharge patient home  DISCHARGE CONDITIONS:   STA BLE  CONSULTS OBTAINED:  Treatment Team:  [name], MD   PROCEDURES  None   DRUG ALLERGIES:  No Known Allergies  DISCHARGE MEDICATIONS:   Allergies as of 04/23/2018   No Known Allergies     Medication List    STOP taking these medications   azithromycin 250 MG tablet Commonly known as:  Zithromax Z-Pak   nitroGLYCERIN 0.4 MG SL tablet Commonly known as:  NITROSTAT     TAKE these medications   albuterol 108 (90 Base) MCG/ACT inhaler Commonly known as:  PROVENTIL HFA;VENTOLIN HFA Inhale 2 puffs into the lungs every 6 (six) hours as needed for wheezing or shortness of breath.   amoxicillin-clavulanate 875-125 MG tablet Commonly known as:  Augmentin Take 1 tablet by mouth 2 (two) times daily for 5 days.   doxycycline 100 MG tablet Commonly known as:  VIBRA-TABS Take 1 tablet (100 mg total) by mouth every 12 (twelve) hours for 5 days.   folic acid 1 MG tablet Commonly known as:  FOLVITE Take 1 tablet (1 mg total) by mouth daily. Start taking on:  April 24, 2018   Ipratropium-Albuterol 20-100 MCG/ACT Aers respimat Commonly known as:  COMBIVENT Inhale 1 puff into the lungs  every 6 (six) hours.   isosorbide mononitrate 30 MG 24 hr tablet Commonly known as:  IMDUR Take 30 mg by mouth daily.   multivitamin with minerals Tabs tablet Take 1 tablet by mouth daily. Start taking on:  April 24, 2018   phosphorus 161-096-045 MG tablet Commonly known as:  K PHOS NEUTRAL Take 2 tablets (500 mg total) by mouth 2 (two) times daily.   predniSONE 10 MG (21)  Tbpk tablet Commonly known as:  STERAPRED UNI-PAK 21 TAB Take 1 tablet (10 mg total) by mouth daily. Take 6 tablets by mouth for 1 day followed by  5 tablets by mouth for 1 day followed by  4 tablets by mouth for 1 day followed by  3 tablets by mouth for 1 day followed by  2 tablets by mouth for 1 day followed by  1 tablet by mouth for a day and stop   thiamine 100 MG tablet Take 1 tablet (100 mg total) by mouth daily. Start taking on:  April 24, 2018        DISCHARGE INSTRUCTIONS:   Follow-up with the primary care physician[facility] in 3 days  DIET:  Low fat, Low cholesterol diet  DISCHARGE CONDITION:  Fair  ACTIVITY:  Activity as tolerated  OXYGEN:  Home Oxygen: No.   Oxygen Delivery: room air  DISCHARGE LOCATION:  home   If you experience worsening of your admission symptoms, develop shortness of breath, life threatening emergency, suicidal or homicidal thoughts you must seek medical attention immediately by calling 911 or calling your MD immediately  if symptoms less severe.  You Must read complete instructions/literature along with all the possible adverse reactions/side effects for all the Medicines you take and that have been prescribed to you. Take any new Medicines after you have completely understood and accpet all the possible adverse reactions/side effects.   Please note  You were cared for by a hospitalist during your hospital stay. If you have any questions about your discharge medications or the care you received while you were in the hospital after you are discharged, you can call the unit and asked to speak with the hospitalist on call if the hospitalist that took care of you is not available. Once you are discharged, your primary care physician will handle any further medical issues. Please note that NO REFILLS for any discharge medications will be authorized once you are discharged, as it is imperative that you return to your primary care  physician (or establish a relationship with a primary care physician if you do not have one) for your aftercare needs so that they can reassess your need for medications and monitor your lab values.     Today  Chief Complaint  Patient presents with  . Shortness of Breath   Patient is feeling much better shortness of breath significantly improved COVID  test is negative and he wants to go home  ROS:  CONSTITUTIONAL: Denies fevers, chills. Denies any fatigue, weakness.  EYES: Denies blurry vision, double vision, eye pain. EARS, NOSE, THROAT: Denies tinnitus, ear pain, hearing loss. RESPIRATORY: Denies cough, wheeze, shortness of breath.  CARDIOVASCULAR: Denies chest pain, palpitations, edema.  GASTROINTESTINAL: Denies nausea, vomiting, diarrhea, abdominal pain. Denies bright red blood per rectum. GENITOURINARY: Denies dysuria, hematuria. ENDOCRINE: Denies nocturia or thyroid problems. HEMATOLOGIC AND LYMPHATIC: Denies easy bruising or bleeding. SKIN: Denies rash or lesion. MUSCULOSKELETAL: Denies pain in neck, back, shoulder, knees, hips or arthritic symptoms.  NEUROLOGIC: Denies paralysis, paresthesias.  PSYCHIATRIC: Denies  anxiety or depressive symptoms.   VITAL SIGNS:  Blood pressure 132/85, pulse 90, temperature 98 F (36.7 C), temperature source Oral, resp. rate 20, height 6\' 1"  (1.854 m), weight 116.6 kg, SpO2 94 %.  I/O:    Intake/Output Summary (Last 24 hours) at 04/23/2018 1450 Last data filed at 04/23/2018 0500 Gross per 24 hour  Intake 500 ml  Output 0 ml  Net 500 ml    PHYSICAL EXAMINATION:  GENERAL:  63 y.o.-year-old patient lying in the bed with no acute distress.  EYES: Pupils equal, round, reactive to light and accommodation. No scleral icterus. Extraocular muscles intact.  HEENT: Head atraumatic, normocephalic. Oropharynx and nasopharynx clear.  NECK:  Supple, no jugular venous distention. No thyroid enlargement, no tenderness.  LUNGS: Normal breath sounds  bilaterally, no wheezing, rales,rhonchi or crepitation. No use of accessory muscles of respiration.  CARDIOVASCULAR: S1, S2 normal. No murmurs, rubs, or gallops.  ABDOMEN: Soft, non-tender, non-distended. Bowel sounds present. No organomegaly or mass.  EXTREMITIES: No pedal edema, cyanosis, or clubbing.  NEUROLOGIC: Awake and alert and oriented x3 sensation intact. Gait not checked.  PSYCHIATRIC: The patient is alert and oriented x 3.  SKIN: No obvious rash, lesion, or ulcer.   DATA REVIEW:   CBC Recent Labs  Lab 04/21/18 1158  WBC 6.6  HGB 14.8  HCT 43.3  PLT 193    Chemistries  Recent Labs  Lab 04/21/18 1158 04/22/18 0153  NA 138  --   K 4.6  --   CL 102  --   CO2 28  --   GLUCOSE 92  --   BUN 21  --   CREATININE 0.76  --   CALCIUM 9.4  --   MG  --  2.3  AST 20  --   ALT 27  --   ALKPHOS 83  --   BILITOT 1.1  --     Cardiac Enzymes Recent Labs  Lab 04/21/18 1158  TROPONINI <0.03    Microbiology Results  Results for orders placed or performed during the hospital encounter of 04/21/18  Blood Culture (routine x 2)     Status: None (Preliminary result)   Collection Time: 04/21/18 11:58 AM  Result Value Ref Range Status   Specimen Description BLOOD R HAND  Final   Special Requests   Final    BOTTLES DRAWN AEROBIC AND ANAEROBIC Blood Culture adequate volume   Culture   Final    NO GROWTH 2 DAYS Performed at [facility], [address] [place], [place] [address]    Report Status PENDING  Incomplete  Blood Culture (routine x 2)     Status: None (Preliminary result)   Collection Time: 04/21/18 11:58 AM  Result Value Ref Range Status   Specimen Description BLOOD RAC  Final   Special Requests   Final    BOTTLES DRAWN AEROBIC AND ANAEROBIC Blood Culture adequate volume   Culture   Final    NO GROWTH 2 DAYS Performed at [facility], [address] [place], [place] [address]    Report Status PENDING  Incomplete  Novel Coronavirus, NAA  (hospital order; send-out to ref lab)     Status: None   Collection Time: 04/21/18 11:58 AM  Result Value Ref Range Status   SARS-CoV-2, NAA NOT DETECTED NOT DETECTED Final    Comment: Negative (Not Detected) results do not exclude infection caused by SARS CoV 2 and should not be used as the sole basis for treatment or other  patient management decisions. Optimum specimen types and timing for peak viral levels during infections caused  by SARS CoV 2 have not been determined. Collection of multiple specimens (types and time points) from the same patient may be necessary to detect the virus. Improper specimen collection and handling, sequence variability underlying assay primers and or probes, or the presence of organisms in  quantities less than the limit of detection of the assay may lead to false negative results. Positive and negative predictive values of testing are highly dependent on prevalence. False negative results are more likely when prevalence of disease is high. Performed at [facility] (NOTE) The expected result is Negative (Not Detected). The SARS CoV 2 test is intended for the presumptive qualitative  detection of nucleic acid from SARS C oV 2 in upper and lower  respiratory specimens. Testing methodology is real time RT PCR. Test results must be correlated with clinical presentation and  evaluated in the context of other laboratory and epidemiologic data.  Test performance can be affected because the epidemiology and  clinical spectrum of infection caused by SARS CoV 2 is not fully  known. For example, the optimum types of specimens to collect and  when during the course of infection these specimens are most likely  to contain detectable viral RNA may not be known. This test has not been Food and Drug Administration (FDA) cleared or  approved and has been authorized by FDA under an Emergency Use  Authorization (EUA). The test is only authorized for the duration of  the  declaration that circumstances exist justifying the authorization  of emergency use of in vitro diagnostic tests for detection and or  diagnosis of SARS CoV 2 under Section 564(b)(1) of the Act, 21 U.S.C.  section [phone] 3(b)(1), unless th e authorization is terminated or  revoked sooner. [facility] is certified under the  Clinical Laboratory Improvement Amendments of 1988 (CLIA), 42 U.S.C.  section [phone], to perform high complexity tests. Performed at [facility] CLIA [id number] [address], [address], [address], [place], [place] [address] Laboratory Director: Turner Daniels, MD Fact Sheet for Healthcare Providers  https://pope.com/ Fact Sheet for Patients  [email or web address] Performed at [facility], [address] [place], [place] [address]    Coronavirus Source NASOPHARYNGEAL  Final    Comment: Performed at [facility], [address] [place] [place], [place] [address]    RADIOLOGY:  Dg Chest Portable 1 View  Result Date: 04/21/2018 CLINICAL DATA:  Dyspnea, worsening EXAM: PORTABLE CHEST 1 VIEW COMPARISON:  03/21/2018 chest radiograph. FINDINGS: Stable cardiomediastinal silhouette with normal heart size. No pneumothorax. No pleural effusion. There is patchy opacity throughout both lungs with a peripheral basilar predominance, slightly worsened from prior. IMPRESSION: Patchy opacity throughout both lungs with a peripheral basilar predominance, slightly worsened from prior. Atypical infection including viral pneumonia is on the differential. These results were called by telephone at the time of interpretation on 04/21/2018 at 11:33 am to Dr. Ileana Roup , who verbally acknowledged these results. Electronically Signed   By: Delbert Phenix M.D.   On: 04/21/2018 11:33    EKG:   Orders placed or performed during the hospital encounter of 04/21/18  . EKG 12-Lead  . EKG 12-Lead  . ED EKG 12-Lead   . ED EKG 12-Lead  . EKG      Management plans discussed with the patient, HE IS  in agreement.  CODE STATUS:     Code Status  Orders  (From admission, onward)         Start     Ordered   04/22/18 0124  Full code  Continuous     04/22/18 0124        Code Status History    Date Active Date Inactive Code Status Order ID Comments User Context   04/21/2018 2220 04/22/2018 0124 Full Code [id number]  Willy Eddy, MD ED   04/21/2018 2153 04/21/2018 2219 Full Code [id number]  Willy Eddy, MD ED   03/21/2018 1936 03/23/2018 1902 Full Code [id number]  Adrian Saran, MD ED   10/28/2016 1036 10/28/2016 1808 Full Code [id number]  Marykay Lex, MD Inpatient      TOTAL TIME TAKING CARE OF THIS PATIENT: .   Note: This dictation was prepared with Dragon dictation along with smaller phrase technology. Any transcriptional errors that result from this process are unintentional.   @  on 04/23/2018 at 2:50 PM  Between 7am to 6pm - Pager - [phone]  After 6pm go to [email or web address] - password [email or web address]  [facility]  Office  [phone]  CC: Primary care physician; Patient, No Pcp Per

## 2018-04-23 NOTE — Discharge Instructions (Signed)
Follow-up with primary care physician in 3 to 4 days ° °

## 2018-04-23 NOTE — TOC Initial Note (Signed)
Transition of Care (TOC) - Initial/Assessment Note    Patient Details  Name: Jason CoveFoster Bodiford Jr. MRN: [id number] Date of Birth: 09/03/1955  Transition of Care [facility](TOC) CM/SW Contact:    Sherren KernsJennifer L Pharoah Goggins, RN Phone Number: 04/23/2018, 12:24 PM  Clinical Narrative:      Patient is from home with sister.  He is independent in all ADL's.  No PCP; states he tried to make appointment with [facility] after last admission but wasn't able to get an appointment because of COVID.  [facility] and sent Smiley HousemanLorrie Carter a message through the in basket to please call patient with a new patient appointment.  Obtains medications at [facility] without difficulty.  Started a Medicaid application last week.  He uses chronic oxygen at 2L in the home; cannot remember the provider.  COVID results negative.                 Expected Discharge Plan: Home/Self Care Barriers to Discharge: Continued Medical Work up   Patient Goals and CMS Choice Patient states their goals for this hospitalization and ongoing recovery are:: discharge home today      Expected Discharge Plan and Services Expected Discharge Plan: Home/Self Care   Discharge Planning Services: CM Consult   Living arrangements for the past 2 months: Single Family Home                          Prior Living Arrangements/Services Living arrangements for the past 2 months: Single Family Home Lives with:: Siblings Patient language and need for interpreter reviewed:: Yes Do you feel safe going back to the place where you live?: Yes          Current home services: DME(oxygen) Criminal Activity/Legal Involvement Pertinent to Current Situation/Hospitalization: No - Comment as needed  Activities of Daily Living Home Assistive Devices/Equipment: Oxygen ADL Screening (condition at time of admission) Patient's cognitive ability adequate to safely complete daily activities?: Yes Is the patient deaf or have difficulty hearing?: No Does the patient have  difficulty seeing, even when wearing glasses/contacts?: No Does the patient have difficulty concentrating, remembering, or making decisions?: No Patient able to express need for assistance with ADLs?: Yes Does the patient have difficulty dressing or bathing?: No Independently performs ADLs?: Yes (appropriate for developmental age) Does the patient have difficulty walking or climbing stairs?: No Weakness of Legs: None Weakness of Arms/Hands: None  Permission Sought/Granted                  Emotional Assessment   Attitude/Demeanor/Rapport: Gracious Affect (typically observed): Accepting Orientation: : Oriented to Self, Oriented to Place, Oriented to  Time, Oriented to Situation Alcohol / Substance Use: Not Applicable    Admission diagnosis:  Acute bronchitis, unspecified organism [J20.9] Suspected Covid-19 Virus Infection [R68.89] Patient Active Problem List   Diagnosis Date Noted  . Suspected Covid-19 Virus Infection 04/21/2018  . Hypoxia 03/21/2018  . Bleach ingestion 08/14/2017  . 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 Substance induced mood disorder (HCC) 08/14/2017  . Alcohol abuse 08/14/2017  . Unstable angina (HCC) 10/22/2016  . Hyperlipidemia 10/22/2016  . Medication management 10/22/2016   PCP:  Patient, No Pcp Per Pharmacy:   [facility] [address] - [place], [place] - [address] [place] [address] [place] [address] Phone: [phone] Fax: [phone]     Social Determinants of Health (SDOH) Interventions    Readmission Risk Interventions Readmission  Risk Prevention Plan 04/23/2018  Transportation Screening Complete  PCP or Specialist Appt within 3-5 Days Complete  HRI or Home Care Consult Complete  Social Work Consult for Recovery Care Planning/Counseling Patient refused  Palliative Care Screening Not Applicable  Medication Review Oceanographer) Complete  Some recent data might be hidden

## 2018-04-23 NOTE — Progress Notes (Signed)
Patient is alert and oriented and able to verbalize needs. No complaints of pain at this time. Printed AVS and scripts and [facility] paperwork given in discharge packet. All discharge instructions gone over with patient at this time. No concerns voiced at this time. All belongings packed. Patient called sister for transportation home.  Suzan Slick, RN

## 2018-04-23 NOTE — TOC Transition Note (Signed)
Transition of Care [facility]) - CM/SW Discharge Note   Patient Details  Name: Jason Wiggins. MRN: [id number] Date of Birth: Sep 06, 1955  [facility]) CM/SW Contact:  Sherren Kerns, RN Phone Number: 04/23/2018, 2:24 PM   Clinical Narrative:   Discharge order placed.  Provided [facility] application and contact information to Hoberg, RN to give to patient at DC.  Refferal has been made to [facility] for new patient appointment.      Final next level of care: Home/Self Care Barriers to Discharge: No Barriers Identified   Patient Goals and CMS Choice Patient states their goals for this hospitalization and ongoing recovery are:: discharge home today      Discharge Placement                       Discharge Plan and Services   Discharge Planning Services: CM Consult                      Social Determinants of Health (SDOH) Interventions     Readmission Risk Interventions Readmission Risk Prevention Plan 04/23/2018  Transportation Screening Complete  PCP or Specialist Appt within 3-5 Days Complete  HRI or Home Care Consult Complete  Social Work Consult for Recovery Care Planning/Counseling Patient refused  Palliative Care Screening Not Applicable  Medication Review Oceanographer) Complete  Some recent data might be hidden

## 2018-04-23 NOTE — Progress Notes (Signed)
Patient on oxygen at home after discharge on last admission. Patient was not wearing oxygen when this RN entered room earlier in the shifts. Sats on RA 95%. Patient in no distress and breathing was regular and unlabored. Sats are being spot checked throughout the night. So far sats have remained >95% and no oxygen has been required. Will continue to monitor.

## 2018-04-24 LAB — LEGIONELLA PNEUMOPHILA SEROGP 1 UR AG: L. pneumophila Serogp 1 Ur Ag: NEGATIVE

## 2018-04-24 LAB — HIV ANTIBODY (ROUTINE TESTING W REFLEX): HIV Screen 4th Generation wRfx: NONREACTIVE

## 2018-04-24 LAB — HIGH SENSITIVITY CRP: CRP, High Sensitivity: 6.31 mg/L — ABNORMAL HIGH (ref 0.00–3.00)

## 2018-04-25 ENCOUNTER — Other Ambulatory Visit: Payer: Self-pay

## 2018-04-25 ENCOUNTER — Encounter: Payer: Self-pay | Admitting: *Deleted

## 2018-04-25 DIAGNOSIS — M1990 Unspecified osteoarthritis, unspecified site: Secondary | ICD-10-CM | POA: Diagnosis present

## 2018-04-25 DIAGNOSIS — I10 Essential (primary) hypertension: Secondary | ICD-10-CM | POA: Diagnosis present

## 2018-04-25 DIAGNOSIS — Y907 Blood alcohol level of 200-239 mg/100 ml: Secondary | ICD-10-CM | POA: Diagnosis present

## 2018-04-25 DIAGNOSIS — Z809 Family history of malignant neoplasm, unspecified: Secondary | ICD-10-CM

## 2018-04-25 DIAGNOSIS — F10120 Alcohol abuse with intoxication, uncomplicated: Secondary | ICD-10-CM | POA: Diagnosis present

## 2018-04-25 DIAGNOSIS — Y95 Nosocomial condition: Secondary | ICD-10-CM | POA: Diagnosis present

## 2018-04-25 DIAGNOSIS — A419 Sepsis, unspecified organism: Principal | ICD-10-CM | POA: Diagnosis present

## 2018-04-25 DIAGNOSIS — J189 Pneumonia, unspecified organism: Secondary | ICD-10-CM | POA: Diagnosis present

## 2018-04-25 NOTE — ED Triage Notes (Addendum)
Pt ambulatory to triage.  Pt has sob and chest pain since yesterday.  Pt is on 2 liters oxygen.  No n/v/   Pt was seen here recently for similar sx Pt alert.

## 2018-04-26 ENCOUNTER — Emergency Department: Payer: Self-pay

## 2018-04-26 ENCOUNTER — Inpatient Hospital Stay
Admission: EM | Admit: 2018-04-26 | Discharge: 2018-04-27 | DRG: 871 | Disposition: A | Discharge status: Home / Self Care | Payer: Self-pay | Attending: Internal Medicine | Admitting: Internal Medicine

## 2018-04-26 DIAGNOSIS — J189 Pneumonia, unspecified organism: Secondary | ICD-10-CM

## 2018-04-26 DIAGNOSIS — A419 Sepsis, unspecified organism: Secondary | ICD-10-CM | POA: Diagnosis present

## 2018-04-26 DIAGNOSIS — R0602 Shortness of breath: Secondary | ICD-10-CM

## 2018-04-26 LAB — COMPREHENSIVE METABOLIC PANEL
ALT: 25 U/L (ref 0–44)
AST: 17 U/L (ref 15–41)
Albumin: 3.8 g/dL (ref 3.5–5.0)
Alkaline Phosphatase: 75 U/L (ref 38–126)
Anion gap: 11 (ref 5–15)
BUN: 16 mg/dL (ref 8–23)
CO2: 22 mmol/L (ref 22–32)
Calcium: 8.9 mg/dL (ref 8.9–10.3)
Chloride: 107 mmol/L (ref 98–111)
Creatinine, Ser: 0.87 mg/dL (ref 0.61–1.24)
GFR calc Af Amer: >60 mL/min (ref >60)
GFR calc non Af Amer: >60 mL/min (ref >60)
Glucose, Bld: 124 mg/dL — ABNORMAL HIGH (ref 70–99)
Potassium: 3.7 mmol/L (ref 3.5–5.1)
Sodium: 140 mmol/L (ref 135–145)
Total Bilirubin: 0.5 mg/dL (ref 0.3–1.2)
Total Protein: 7 g/dL (ref 6.5–8.1)

## 2018-04-26 LAB — CBC WITH DIFFERENTIAL/PLATELET
Abs Immature Granulocytes: 0.37 10*3/uL — ABNORMAL HIGH (ref 0.00–0.07)
Basophils Absolute: 0 10*3/uL (ref 0.0–0.1)
Basophils Relative: 0 %
Eosinophils Absolute: 0 10*3/uL (ref 0.0–0.5)
Eosinophils Relative: 0 %
HCT: 37 % — ABNORMAL LOW (ref 39.0–52.0)
Hemoglobin: 12.6 g/dL — ABNORMAL LOW (ref 13.0–17.0)
Immature Granulocytes: 3 %
Lymphocytes Relative: 24 %
Lymphs Abs: 2.6 10*3/uL (ref 0.7–4.0)
MCH: 32.8 pg (ref 26.0–34.0)
MCHC: 34.1 g/dL (ref 30.0–36.0)
MCV: 96.4 fL (ref 80.0–100.0)
Monocytes Absolute: 0.7 10*3/uL (ref 0.1–1.0)
Monocytes Relative: 7 %
Neutro Abs: 7.3 10*3/uL (ref 1.7–7.7)
Neutrophils Relative %: 66 %
Platelets: 221 10*3/uL (ref 150–400)
RBC: 3.84 MIL/uL — ABNORMAL LOW (ref 4.22–5.81)
RDW: 14.6 % (ref 11.5–15.5)
WBC: 11.1 10*3/uL — ABNORMAL HIGH (ref 4.0–10.5)
nRBC: 0.4 % — ABNORMAL HIGH (ref 0.0–0.2)

## 2018-04-26 LAB — LACTIC ACID, PLASMA
Lactic Acid, Venous: 2.6 mmol/L (ref 0.5–1.9)
Lactic Acid, Venous: 2.2 mmol/L (ref 0.5–1.9)
Lactic Acid, Venous: 2 mmol/L (ref 0.5–1.9)

## 2018-04-26 LAB — CULTURE, BLOOD (ROUTINE X 2)
Culture: NO GROWTH
Culture: NO GROWTH
Special Requests: ADEQUATE
Special Requests: ADEQUATE

## 2018-04-26 LAB — ETHANOL: Alcohol, Ethyl (B): 207 mg/dL — ABNORMAL HIGH (ref <10)

## 2018-04-26 LAB — TSH: TSH: 1.733 u[IU]/mL (ref 0.350–4.500)

## 2018-04-26 LAB — MRSA PCR SCREENING: MRSA by PCR: NEGATIVE

## 2018-04-26 LAB — TROPONIN I: Troponin I: <0.03 ng/mL (ref <0.03)

## 2018-04-26 MED ORDER — IOHEXOL 350 MG/ML SOLN
75.0000 mL | Freq: Once | INTRAVENOUS | Status: AC | PRN
  Administered 2018-04-26: 75 mL via INTRAVENOUS

## 2018-04-26 MED ORDER — SODIUM CHLORIDE 0.9 % IV SOLN
500.0000 mg | Freq: Once | INTRAVENOUS | Status: AC
  Administered 2018-04-26: 500 mg via INTRAVENOUS
  Filled 2018-04-26: qty 500

## 2018-04-26 MED ORDER — IPRATROPIUM-ALBUTEROL 0.5-2.5 (3) MG/3ML IN SOLN
3.0000 mL | Freq: Once | RESPIRATORY_TRACT | Status: AC
  Administered 2018-04-26: 3 mL via RESPIRATORY_TRACT

## 2018-04-26 MED ORDER — SODIUM CHLORIDE 0.9 % IV SOLN: 1.0000 g | INTRAVENOUS | Status: DC

## 2018-04-26 MED ORDER — ACETAMINOPHEN 325 MG PO TABS
650.0000 mg | ORAL_TABLET | Freq: Four times a day (QID) | ORAL | Status: DC | PRN
  Administered 2018-04-26 (×2): 650 mg via ORAL
  Filled 2018-04-26 (×2): qty 2

## 2018-04-26 MED ORDER — AZITHROMYCIN 250 MG PO TABS: 250.0000 mg | ORAL_TABLET | Freq: Every day | ORAL | Status: DC

## 2018-04-26 MED ORDER — SODIUM CHLORIDE 0.9 % IV SOLN
1.0000 g | INTRAVENOUS | Status: DC
  Filled 2018-04-26: qty 10

## 2018-04-26 MED ORDER — VANCOMYCIN HCL 10 G IV SOLR
2500.0000 mg | Freq: Once | INTRAVENOUS | Status: AC
  Administered 2018-04-26: 2500 mg via INTRAVENOUS
  Filled 2018-04-26: qty 2500

## 2018-04-26 MED ORDER — ISOSORBIDE MONONITRATE ER 30 MG PO TB24
30.0000 mg | ORAL_TABLET | Freq: Every day | ORAL | Status: DC
  Administered 2018-04-26 – 2018-04-27 (×2): 30 mg via ORAL
  Filled 2018-04-26 (×2): qty 1

## 2018-04-26 MED ORDER — ADULT MULTIVITAMIN W/MINERALS CH
1.0000 | ORAL_TABLET | Freq: Every day | ORAL | Status: DC
  Administered 2018-04-26 – 2018-04-27 (×2): 1 via ORAL
  Filled 2018-04-26 (×2): qty 1

## 2018-04-26 MED ORDER — DOCUSATE SODIUM 100 MG PO CAPS
100.0000 mg | ORAL_CAPSULE | Freq: Two times a day (BID) | ORAL | Status: DC
  Filled 2018-04-26: qty 1

## 2018-04-26 MED ORDER — SODIUM CHLORIDE 0.9 % IV SOLN
2.0000 g | Freq: Three times a day (TID) | INTRAVENOUS | Status: DC
  Administered 2018-04-26 – 2018-04-27 (×3): 2 g via INTRAVENOUS
  Filled 2018-04-26 (×7): qty 2

## 2018-04-26 MED ORDER — IPRATROPIUM-ALBUTEROL 0.5-2.5 (3) MG/3ML IN SOLN
RESPIRATORY_TRACT | Status: AC
  Filled 2018-04-26: qty 3

## 2018-04-26 MED ORDER — FOLIC ACID 1 MG PO TABS
1.0000 mg | ORAL_TABLET | Freq: Every day | ORAL | Status: DC
  Administered 2018-04-26 – 2018-04-27 (×2): 1 mg via ORAL
  Filled 2018-04-26 (×2): qty 1

## 2018-04-26 MED ORDER — ONDANSETRON HCL 4 MG/2ML IJ SOLN: 4.0000 mg | Freq: Four times a day (QID) | INTRAMUSCULAR | Status: DC | PRN

## 2018-04-26 MED ORDER — ALBUTEROL SULFATE (2.5 MG/3ML) 0.083% IN NEBU: 2.5000 mg | INHALATION_SOLUTION | RESPIRATORY_TRACT | Status: DC | PRN

## 2018-04-26 MED ORDER — SODIUM CHLORIDE 0.9 % IV SOLN
1.0000 g | Freq: Once | INTRAVENOUS | Status: AC
  Administered 2018-04-26: 1 g via INTRAVENOUS
  Filled 2018-04-26: qty 10

## 2018-04-26 MED ORDER — SODIUM CHLORIDE 0.9 % IV SOLN
INTRAVENOUS | Status: DC
  Administered 2018-04-26 – 2018-04-27 (×3): via INTRAVENOUS

## 2018-04-26 MED ORDER — ENOXAPARIN SODIUM 40 MG/0.4ML ~~LOC~~ SOLN
40.0000 mg | SUBCUTANEOUS | Status: DC
  Administered 2018-04-26: 40 mg via SUBCUTANEOUS
  Filled 2018-04-26: qty 0.4

## 2018-04-26 MED ORDER — K PHOS MONO-SOD PHOS DI & MONO 155-852-130 MG PO TABS
500.0000 mg | ORAL_TABLET | Freq: Two times a day (BID) | ORAL | Status: DC
  Administered 2018-04-26 – 2018-04-27 (×3): 500 mg via ORAL
  Filled 2018-04-26 (×4): qty 2

## 2018-04-26 MED ORDER — PREDNISONE 50 MG PO TABS
50.0000 mg | ORAL_TABLET | Freq: Every day | ORAL | Status: DC
  Administered 2018-04-26 – 2018-04-27 (×2): 50 mg via ORAL
  Filled 2018-04-26 (×2): qty 1

## 2018-04-26 MED ORDER — ONDANSETRON HCL 4 MG PO TABS: 4.0000 mg | ORAL_TABLET | Freq: Four times a day (QID) | ORAL | Status: DC | PRN

## 2018-04-26 MED ORDER — ACETAMINOPHEN 650 MG RE SUPP: 650.0000 mg | Freq: Four times a day (QID) | RECTAL | Status: DC | PRN

## 2018-04-26 MED ORDER — VANCOMYCIN HCL IN DEXTROSE 1-5 GM/200ML-% IV SOLN
1000.0000 mg | Freq: Two times a day (BID) | INTRAVENOUS | Status: DC
  Filled 2018-04-26: qty 200

## 2018-04-26 MED ORDER — VITAMIN B-1 100 MG PO TABS
100.0000 mg | ORAL_TABLET | Freq: Every day | ORAL | Status: DC
  Administered 2018-04-26 – 2018-04-27 (×2): 100 mg via ORAL
  Filled 2018-04-26 (×2): qty 1

## 2018-04-26 NOTE — ED Notes (Signed)
Pt given a cup of water 
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
## 2018-04-26 NOTE — Progress Notes (Addendum)
Pharmacy Antibiotic Note  Jason Wiggins. is a 63 y.o. male admitted on 04/26/2018 with pneumonia.  Pharmacy has been consulted for Vancomycin and Cefepime dosing.  Plan: 1) Vancomycin 1000 mg IV Q 12 hrs after initial loading dose of 2,500mg . Goal AUC 400-550. Expected AUC: 429.4 Css: 11.0 T1/2: 7.9 hrs SCr used: 0.87  MRSA PCR returned negative and after discussion with Dr. Elisabeth Pigeon, Vancomycin has been discontinued.  2) Cefepime 2g IV Q 8 hours. Patient has a CrCl>60 ml/min so no dosing adjustment is needed currently.   Height: 6\' 1"  (185.4 cm) Weight: 233 lb 4.8 oz (105.8 kg) IBW/kg (Calculated) : 79.9  Temp (24hrs), Avg:98.3 F (36.8 C), Min:98 F (36.7 C), Max:98.7 F (37.1 C)  Recent Labs  Lab 04/21/18 1158 04/26/18 0013 04/26/18 0034 04/26/18 0548  WBC 6.6 11.1*  --   --   CREATININE 0.76 0.87  --   --   LATICACIDVEN 1.5  --  2.6* 2.2*    Estimated Creatinine Clearance: 113.9 mL/min (by C-G formula based on SCr of 0.87 mg/dL).    No Known Allergies  Antimicrobials this admission: Vancomycin 4/16 >>  Cefepime 4/16 >>  Azithromycin 4/16 x 1 dose Ceftriaxone 4/16 x 1 dose  Dose adjustments this admission: None currently  Microbiology results: 4/16 BCx: NGTD 4/16 MRSA PCR: pending  Thank you for allowing pharmacy to be a part of this patient's care.  Bettey Costa, PharmD Clinical Pharmacist 04/26/2018 9:20 AM
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
## 2018-04-26 NOTE — ED Notes (Signed)
ED TO INPATIENT HANDOFF REPORT  ED Nurse Name and Phone #: Levander Campion Name/Age/Gender Jason Wiggins. 63 y.o. male Room/Bed: ED05A/ED05A  Code Status   Code Status: Prior  Home/SNF/Other Home Patient oriented to: situation Is this baseline? Yes   Triage Complete: Triage complete  Chief Complaint Chest and side pain  Triage Note Pt ambulatory to triage.  Pt has sob and chest pain since yesterday.  Pt is on 2 liters oxygen.  No n/v/   Pt was seen here recently for similar sx Pt alert.      Allergies No Known Allergies  Level of Care/Admitting Diagnosis ED Disposition    ED Disposition Condition Comment   Admit  Hospital Area: [facility] [[id number]]  Level of Care: Med-Surg [16]  Diagnosis: Sepsis [facility]) [[id number]]  Admitting Physician: Arnaldo Natal [[id number]]  Attending Physician: Arnaldo Natal [[id number]]  Estimated length of stay: past midnight tomorrow  Certification:: I certify this patient will need inpatient services for at least 2 midnights  Possible Covid Disease Patient Isolation: Low Risk  (Less than 4L [place] supplementation)  PT Class (Do Not Modify): Inpatient [101]  PT Acc Code (Do Not Modify): Private [1]       B Medical/Surgery History Past Medical History:  Diagnosis Date  . Alcohol abuse    possibly as much as 24 beers per day  . Arthritis   . HTN (hypertension)    Past Surgical History:  Procedure Laterality Date  . HERNIA REPAIR     Right inguinal  . INTRAVASCULAR PRESSURE WIRE/FFR STUDY N/A 10/28/2016   Procedure: INTRAVASCULAR PRESSURE WIRE/FFR STUDY;  Surgeon: Marykay Lex, MD;  Location: [facility] INVASIVE CV LAB;  Service: Cardiovascular;  Laterality: N/A;  . LEFT HEART CATH AND CORONARY ANGIOGRAPHY N/A 10/28/2016   Procedure: LEFT HEART CATH AND CORONARY ANGIOGRAPHY;  Surgeon: Marykay Lex, MD;  Location: [facility] INVASIVE CV LAB;  Service: Cardiovascular;  Laterality: N/A;     A IV  Location/Drains/Wounds Patient Lines/Drains/Airways Status   Active Line/Drains/Airways    Name:   Placement date:   Placement time:   Site:   Days:   Peripheral IV 04/26/18 Right Antecubital   04/26/18    0010    Antecubital   less than 1          Intake/Output Last 24 hours No intake or output data in the 24 hours ending 04/26/18 0603  Labs/Imaging Results for orders placed or performed during the hospital encounter of 04/26/18 (from the past 48 hour(s))  Comprehensive metabolic panel     Status: Abnormal   Collection Time: 04/26/18 12:13 AM  Result Value Ref Range   Sodium 140 135 - 145 mmol/L   Potassium 3.7 3.5 - 5.1 mmol/L   Chloride 107 98 - 111 mmol/L   CO2 22 22 - 32 mmol/L   Glucose, Bld 124 (H) 70 - 99 mg/dL   BUN 16 8 - 23 mg/dL   Creatinine, Ser 9.56 0.61 - 1.24 mg/dL   Calcium 8.9 8.9 - 21.3 mg/dL   Total Protein 7.0 6.5 - 8.1 g/dL   Albumin 3.8 3.5 - 5.0 g/dL   AST 17 15 - 41 U/L   ALT 25 0 - 44 U/L   Alkaline Phosphatase 75 38 - 126 U/L   Total Bilirubin 0.5 0.3 - 1.2 mg/dL   GFR calc non Af Amer >60 >60 mL/min   GFR calc Af Amer >60 >60 mL/min   Anion gap 11 5 -  15    Comment: Performed at [facility], [address] [place], [place] [address]  CBC with Differential     Status: Abnormal   Collection Time: 04/26/18 12:13 AM  Result Value Ref Range   WBC 11.1 (H) 4.0 - 10.5 K/uL   RBC 3.84 (L) 4.22 - 5.81 MIL/uL   Hemoglobin 12.6 (L) 13.0 - 17.0 g/dL   HCT 60.437.0 (L) 54.039.0 - 98.152.0 %   MCV 96.4 80.0 - 100.0 fL   MCH 32.8 26.0 - 34.0 pg   MCHC 34.1 30.0 - 36.0 g/dL   RDW 19.114.6 47.811.5 - 29.515.5 %   Platelets 221 150 - 400 K/uL   nRBC 0.4 (H) 0.0 - 0.2 %   Neutrophils Relative % 66 %   Neutro Abs 7.3 1.7 - 7.7 K/uL   Lymphocytes Relative 24 %   Lymphs Abs 2.6 0.7 - 4.0 K/uL   Monocytes Relative 7 %   Monocytes Absolute 0.7 0.1 - 1.0 K/uL   Eosinophils Relative 0 %   Eosinophils Absolute 0.0 0.0 - 0.5 K/uL   Basophils Relative 0 %   Basophils  Absolute 0.0 0.0 - 0.1 K/uL   Immature Granulocytes 3 %   Abs Immature Granulocytes 0.37 (H) 0.00 - 0.07 K/uL    Comment: Performed at [facility], [address] [place], [place] [address]  Ethanol     Status: Abnormal   Collection Time: 04/26/18 12:13 AM  Result Value Ref Range   Alcohol, Ethyl (B) 207 (H) <10 mg/dL    Comment: (NOTE) Lowest detectable limit for serum alcohol is 10 mg/dL. For medical purposes only. Performed at [facility], [address] [place], [place] [address]   Troponin I - ONCE - STAT     Status: None   Collection Time: 04/26/18 12:13 AM  Result Value Ref Range   Troponin I <0.03 <0.03 ng/mL    Comment: Performed at [facility], [address] [place], [place] [address]  Blood gas, venous     Status: Abnormal (Preliminary result)   Collection Time: 04/26/18 12:26 AM  Result Value Ref Range   FIO2 PENDING    pH, Ven 7.40 7.250 - 7.430   pCO2, Ven 40 (L) 44.0 - 60.0 mmHg   pO2, Ven 63.0 (H) 32.0 - 45.0 mmHg   Bicarbonate 24.8 20.0 - 28.0 mmol/L   Acid-Base Excess 0.0 0.0 - 2.0 mmol/L   O2 Saturation 91.7 %   Patient temperature 37.0    Collection site VENOUS    Sample type VENOUS     Comment: Performed at [facility], [address] [place], [place] [address]   Mechanical Rate PENDING   Lactic acid, plasma     Status: Abnormal   Collection Time: 04/26/18 12:34 AM  Result Value Ref Range   Lactic Acid, Venous 2.6 (HH) 0.5 - 1.9 mmol/L    Comment: CRITICAL RESULT CALLED TO, READ BACK BY AND VERIFIED WITH [facility] Emoni Whitworth AT 0123 04/26/2018 SMA Performed at [facility], [address] [place], [place] [address]    Dg Chest 1 View  Result Date: 04/26/2018 CLINICAL DATA:  Chest pain and shortness of breath for 2 days, initial encounter EXAM: CHEST  1 VIEW COMPARISON:  04/21/2018 FINDINGS: Cardiac shadow is stable. Lungs are well aerated bilaterally. Patchy opacities are again identified  throughout both lungs stable from the prior exam. No new focal abnormality is seen. No bony abnormality is noted. IMPRESSION: Stable patchy opacities bilaterally.  Electronically Signed   By: Alcide Clever M.D.   On: 04/26/2018 01:11   Ct Angio Chest Pe W And/or Wo Contrast  Result Date: 04/26/2018 CLINICAL DATA:  63 y/o M; Chest pain, complex, intermediate/high prob of ACS/PE/AAS. EXAM: CT ANGIOGRAPHY CHEST WITH CONTRAST TECHNIQUE: Multidetector CT imaging of the chest was performed using the standard protocol during bolus administration of intravenous contrast. Multiplanar CT image reconstructions and MIPs were obtained to evaluate the vascular anatomy. CONTRAST:  13mL OMNIPAQUE IOHEXOL 350 MG/ML SOLN COMPARISON:  09/29/2016 CT angiogram of chest for dissection. 04/26/2018, 04/21/2018, 03/21/2018 chest radiographs. FINDINGS: Cardiovascular: Satisfactory opacification of the pulmonary arteries to the segmental level. No evidence of pulmonary embolism. Mild cardiomegaly. No pericardial effusion. Coronary artery calcific atherosclerosis. Normal caliber thoracic aorta and main pulmonary artery. Mediastinum/Nodes: No enlarged mediastinal, hilar, or axillary lymph nodes. Thyroid gland, trachea, and esophagus demonstrate no significant findings. Lungs/Pleura: Coarse reticular and ground-glass opacities with peripheral, peribronchovascular, and basilar distribution as well as subpleural sparing and banding. No consolidation, effusion, or pneumothorax. Upper Abdomen: No acute abnormality. Musculoskeletal: No chest wall abnormality. No acute or significant osseous findings. Review of the MIP images confirms the above findings. IMPRESSION: 1. No pulmonary embolus identified. 2. Findings probably representing late organizing pneumonia which can be seen with atypical pneumonia including viral pneumonia, drug reaction, autoimmune disease, inhalational injury, and can be idiopathic. Similar findings are present on prior chest  radiographs given differences in technique. 3. Mild cardiomegaly and coronary artery calcific atherosclerosis. Electronically Signed   By: Mitzi Hansen M.D.   On: 04/26/2018 02:17    Pending Labs Unresulted Labs (From admission, onward)    Start     Ordered   04/26/18 0554  Lactic acid, plasma  Now then every 2 hours,   STAT     04/26/18 0554   04/26/18 0023  Blood culture (routine x 2)  BLOOD CULTURE X 2,   STAT     04/26/18 0025   Signed and Held  Creatinine, serum  (enoxaparin (LOVENOX)    CrCl >/= 30 ml/min)  Weekly,   R    Comments:  while on enoxaparin therapy    Signed and Held   Signed and Held  TSH  Add-on,   R     Signed and Held          Vitals/Pain Today's Vitals   04/26/18 0400 04/26/18 0430 04/26/18 0500 04/26/18 0550  BP: 125/80 128/83 128/80 (!) 138/92  Pulse:    92  Resp:      Temp:      TempSrc:      SpO2:    92%  Weight:      Height:      PainSc:        Isolation Precautions No active isolations  Medications Medications  ipratropium-albuterol (DUONEB) 0.5-2.5 (3) MG/3ML nebulizer solution 3 mL (3 mLs Nebulization Given 04/26/18 0021)  cefTRIAXone (ROCEPHIN) 1 g in sodium chloride 0.9 % 100 mL IVPB (0 g Intravenous Stopped 04/26/18 0115)  azithromycin (ZITHROMAX) 500 mg in sodium chloride 0.9 % 250 mL IVPB (0 mg Intravenous Stopped 04/26/18 0301)  iohexol (OMNIPAQUE) 350 MG/ML injection 75 mL (75 mLs Intravenous Contrast Given 04/26/18 0125)    Mobility walks Moderate fall risk   Focused Assessments Pulmonary Assessment Handoff:  Lung sounds:   O2 Device: Nasal Cannula O2 Flow Rate (L/min): 2 L/min      R Recommendations: See Admitting Provider Note  Report given to:   Additional Notes:

## 2018-04-26 NOTE — Progress Notes (Signed)
CODE SEPSIS - PHARMACY COMMUNICATION  **Broad Spectrum Antibiotics should be administered within 1 hour of Sepsis diagnosis**  Time Code Sepsis Called/Page Received: @ 0434  Antibiotics Ordered: Ceftriaxone and Azithromycin   Time of 1st antibiotic administration: @ 0045  Additional action taken by pharmacy: N/A  If necessary, Name of Provider/Nurse Contacted: N/A  Gardner Candle, PharmD, BCPS Clinical Pharmacist 04/26/2018 4:39 AM

## 2018-04-27 MED ORDER — ALBUTEROL SULFATE HFA 108 (90 BASE) MCG/ACT IN AERS: 2.0000 | INHALATION_SPRAY | Freq: Four times a day (QID) | RESPIRATORY_TRACT | 2 refills | Status: DC | PRN

## 2018-04-27 MED ORDER — ISOSORBIDE MONONITRATE ER 30 MG PO TB24: 30.0000 mg | ORAL_TABLET | Freq: Every day | ORAL | 0 refills | Status: DC

## 2018-04-27 MED ORDER — FOLIC ACID 1 MG PO TABS: 1.0000 mg | ORAL_TABLET | Freq: Every day | ORAL | 0 refills | Status: DC

## 2018-04-27 MED ORDER — PREDNISONE 10 MG (21) PO TBPK: 10.0000 mg | ORAL_TABLET | Freq: Every day | ORAL | 0 refills | Status: DC

## 2018-04-27 MED ORDER — IPRATROPIUM-ALBUTEROL 20-100 MCG/ACT IN AERS: 1.0000 | INHALATION_SPRAY | Freq: Four times a day (QID) | RESPIRATORY_TRACT | 0 refills | Status: DC

## 2018-04-27 MED ORDER — AMOXICILLIN-POT CLAVULANATE 875-125 MG PO TABS: 1.0000 | ORAL_TABLET | Freq: Two times a day (BID) | ORAL | 0 refills | Status: AC

## 2018-04-27 NOTE — TOC Transition Note (Signed)
Transition of Care [facility]) - CM/SW Discharge Note   Patient Details  Name: Jason Wiggins. MRN: [id number] Date of Birth: 18-Sep-1955  Transition of Care [facility]) CM/SW Contact:  Sherren Kerns, RN Phone Number: 04/27/2018, 11:25 AM   Clinical Narrative:   Patient is discharging to home today.  His sister will be transporting him home.  All prescriptions printed by MD.  [facility] after verifying prescriptions in stock.  Combivent not in stock; MD notified.  Explained to patient the process of obtaining medications now at [facility].  He is aware he will need to wait on them.  Indigent clinic and [facility] application given to patient yesterday.      Final next level of care: Home/Self Care Barriers to Discharge: No Barriers Identified   Patient Goals and CMS Choice        Discharge Placement                       Discharge Plan and Services   Discharge Planning Services: CM Consult, [employer], Medication Assistance                      Social Determinants of Health (SDOH) Interventions     Readmission Risk Interventions Readmission Risk Prevention Plan 04/26/2018 04/23/2018  Transportation Screening Complete Complete  PCP or Specialist Appt within 3-5 Days Not Complete Complete  Not Complete comments No PCP-provided booklet of local indigent clinics.   -  HRI or Home Care Consult Complete Complete  Social Work Consult for Recovery Care Planning/Counseling Patient refused Patient refused  Palliative Care Screening Not Applicable Not Applicable  Medication Review [employer]) Complete Complete  Some recent data might be hidden

## 2018-04-27 NOTE — Progress Notes (Signed)
Pt transported to discharge via WC with home O2 and all other personal belongings; sister waiting downstairs. IV and Tele removed prior to DC. Medications reviewed with pt and he was given a supply of medications. Pt verbalized understanding of all instructions given.

## 2018-04-27 NOTE — Progress Notes (Signed)
Discharge medications received from clinic and given to patient. He states that his sister is on the way and he will let us know when she gets here. At time he will be brought downstairs via WC and home o2

## 2018-04-27 NOTE — Discharge Summary (Signed)
[facility] - [place] at [facility]   PATIENT NAME: Jason Wiggins    MR#:  [id number]  DATE OF BIRTH:  1955-08-24  DATE OF ADMISSION:  04/26/2018 ADMITTING PHYSICIAN: Arnaldo Natal, MD  DATE OF DISCHARGE: 04/27/2018   PRIMARY CARE PHYSICIAN: Patient, No Pcp Per    ADMISSION DIAGNOSIS:  SOB (shortness of breath) [R06.02] Pneumonia due to infectious organism, unspecified laterality, unspecified part of lung [J18.9] Sepsis, due to unspecified organism, unspecified whether acute organ dysfunction present (HCC) [A41.9]  DISCHARGE DIAGNOSIS:  Active Problems:   Sepsis (HCC)   SECONDARY DIAGNOSIS:   Past Medical History:  Diagnosis Date  . Alcohol abuse    possibly as much as 24 beers per day  . Arthritis   . HTN (hypertension)     HOSPITAL COURSE:   1. Sepsis: The patient meets criteria via tachycardia, tachypnea and leukocytosis. He is afebrile. The patient is hemodynamically stable. Follow blood cultures for growth and sensitivities. Source appears to be pneumonia. 2. Pneumonia: Hospital-acquired; started vancomycin and cefepime. Supplemental oxygen and albuterol as needed. MRSA PCR negative so stopped vancomycin.  Much improved next day- in last 1 week already got 5 days IV therapy- give 2 days at home on discharge. 3. Hypertension: Controlled; continue Imdur 4. Alcohol abuse: no withdrawal symptoms during 2 days in hospital. 5. DVT prophylaxis: Lovenox 6. GI prophylaxis: None  DISCHARGE CONDITIONS:   Stable.  CONSULTS OBTAINED:    DRUG ALLERGIES:  No Known Allergies  DISCHARGE MEDICATIONS:   Allergies as of 04/27/2018   No Known Allergies     Medication List    STOP taking these medications   doxycycline 100 MG tablet Commonly known as:  VIBRA-TABS     TAKE these medications   albuterol 108 (90 Base) MCG/ACT inhaler Commonly known as:  VENTOLIN HFA Inhale 2 puffs into the lungs every 6 (six) hours as needed for  wheezing or shortness of breath.   amoxicillin-clavulanate 875-125 MG tablet Commonly known as:  Augmentin Take 1 tablet by mouth 2 (two) times daily for 2 days.   folic acid 1 MG tablet Commonly known as:  FOLVITE Take 1 tablet (1 mg total) by mouth daily.   Ipratropium-Albuterol 20-100 MCG/ACT Aers respimat Commonly known as:  COMBIVENT Inhale 1 puff into the lungs every 6 (six) hours.   isosorbide mononitrate 30 MG 24 hr tablet Commonly known as:  IMDUR Take 30 mg by mouth daily.   multivitamin with minerals Tabs tablet Take 1 tablet by mouth daily.   phosphorus 155-852-130 MG tablet Commonly known as:  K PHOS NEUTRAL Take 2 tablets (500 mg total) by mouth 2 (two) times daily.   predniSONE 10 MG (21) Tbpk tablet Commonly known as:  STERAPRED UNI-PAK 21 TAB Take 1 tablet (10 mg total) by mouth daily. Take 6 tablets by mouth for 1 day followed by  5 tablets by mouth for 1 day followed by  4 tablets by mouth for 1 day followed by  3 tablets by mouth for 1 day followed by  2 tablets by mouth for 1 day followed by  1 tablet by mouth for a day and stop   thiamine 100 MG tablet Take 1 tablet (100 mg total) by mouth daily.        DISCHARGE INSTRUCTIONS:   Follow with PMD in 1-2 weeks.  If you experience worsening of your admission symptoms, develop shortness of breath, life threatening emergency, suicidal or homicidal thoughts you must seek medical attention  immediately by calling 911 or calling your MD immediately  if symptoms less severe.  You Must read complete instructions/literature along with all the possible adverse reactions/side effects for all the Medicines you take and that have been prescribed to you. Take any new Medicines after you have completely understood and accept all the possible adverse reactions/side effects.   Please note  You were cared for by a hospitalist during your hospital stay. If you have any questions about your discharge medications or  the care you received while you were in the hospital after you are discharged, you can call the unit and asked to speak with the hospitalist on call if the hospitalist that took care of you is not available. Once you are discharged, your primary care physician will handle any further medical issues. Please note that NO REFILLS for any discharge medications will be authorized once you are discharged, as it is imperative that you return to your primary care physician (or establish a relationship with a primary care physician if you do not have one) for your aftercare needs so that they can reassess your need for medications and monitor your lab values.    Today   CHIEF COMPLAINT:   Chief Complaint  Patient presents with  . Shortness of Breath  . Chest Pain    HISTORY OF PRESENT ILLNESS:  Jason Wiggins  is a 63 y.o. male with a known history of EtOH use, HTN, DJD/OA p/w cough/SOB. He is highly irate/agitated, and does not provide historical details. Pt came [facility] ED on Saturday 04/21/2018 afternoon w/ c/o cough/SOB. There was concern for acute viral bronchitis +/- pneumonia +/- COVID-19. Testing was sent, but patient absolutely refused admission. He was prescribed albuterol and Azithromycin. He says he went to his sister's house, where he is staying with his sister, another adult and a child. His sister scolded him. He came back to the ED. He asserts that he does not have COVID-19. He endorses continued/worsening cough, SOB and pleurisy.  Pt then states that he was recently hospitalized for 2d (per records, 03/21/2018-03/23/2018). He says he had pneumonia, and was D/Ced home on O2. He says he took ABx after going home, and was improving while he was taking them, but then got worse again once he ran out of ABx. He says he has been using 2L [place] O2 as prescribed. 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  Afebrile, (-) leukocytosis.  Tachypnoeic, tachycardic, SIRS (+). CXR (+) patchy infiltrates/opacities. Lactate 1.5, PCT < 0.10. Suspect sepsis 2/2 viral pneumonia vs. viral bronchitis. I am told pt was wheezing on ED arrival; (-) wheezing at the time of my exam, though pt exhibits poor air movement. He says he does not smoke, but says his entire family did when he was growing up.  Says he drinks 6 beers per week.   VITAL SIGNS:  Blood pressure 136/80, pulse 91, temperature 98.6 F (37 C), resp. rate 20, height 6\' 1"  (1.854 m), weight 104 kg, SpO2 97 %.  I/O:    Intake/Output Summary (Last 24 hours) at 04/27/2018 1053 Last data filed at 04/27/2018 0300 Gross per 24 hour  Intake 1857.83 ml  Output 300 ml  Net 1557.83 ml    PHYSICAL EXAMINATION:  GENERAL:  63 y.o.-year-old patient lying in the bed with no acute distress.  EYES: Pupils equal, round, reactive to light and accommodation. No scleral icterus. Extraocular muscles intact.  HEENT: Head atraumatic, normocephalic. Oropharynx and nasopharynx clear.  NECK:  Supple, no jugular venous distention. No thyroid enlargement, no tenderness.  LUNGS: Normal breath sounds bilaterally, no wheezing, some crepitation. No use of accessory muscles of respiration.  CARDIOVASCULAR: S1, S2 normal. No murmurs, rubs, or gallops.  ABDOMEN: Soft, nontender, nondistended. Bowel sounds present. No organomegaly or mass.  EXTREMITIES: No pedal edema, cyanosis, or clubbing.  NEUROLOGIC: Cranial nerves II through XII are intact. Muscle strength 5/5 in all extremities. Sensation intact. Gait not checked.  PSYCHIATRIC: The patient is alert and oriented x 3.  SKIN: No obvious rash, lesion, or ulcer.    DATA REVIEW:   CBC Recent Labs  Lab 04/26/18 0013  WBC 11.1*  HGB 12.6*  HCT 37.0*  PLT 221    Chemistries  Recent Labs  Lab 04/22/18 0153 04/26/18 0013  NA  --  140  K  --  3.7  CL  --  107  CO2  --  22  GLUCOSE  --  124*  BUN  --  16  CREATININE  --  0.87  CALCIUM  --   8.9  MG 2.3  --   AST  --  17  ALT  --  25  ALKPHOS  --  75  BILITOT  --  0.5    Cardiac Enzymes Recent Labs  Lab 04/26/18 0013  TROPONINI <0.03    Microbiology Results  Results for orders placed or performed during the hospital encounter of 04/26/18  Blood culture (routine x 2)     Status: None (Preliminary result)   Collection Time: 04/26/18 12:34 AM  Result Value Ref Range Status   Specimen Description BLOOD RIGHT ANTECUBITAL  Final   Special Requests   Final    BOTTLES DRAWN AEROBIC AND ANAEROBIC Blood Culture results may not be optimal due to an excessive volume of blood received in culture bottles   Culture   Final    NO GROWTH 1 DAY Performed at [facility], [address] [place], [place] [address]    Report Status PENDING  Incomplete  Blood culture (routine x 2)     Status: None (Preliminary result)   Collection Time: 04/26/18 12:34 AM  Result Value Ref Range Status   Specimen Description BLOOD BLOOD RIGHT HAND  Final   Special Requests   Final    BOTTLES DRAWN AEROBIC AND ANAEROBIC Blood Culture adequate volume   Culture   Final    NO GROWTH 1 DAY Performed at [facility], [address] [place], [place] [address]    Report Status PENDING  Incomplete  MRSA PCR Screening     Status: None   Collection Time: 04/26/18 11:56 AM  Result Value Ref Range Status   MRSA by PCR NEGATIVE NEGATIVE Final    Comment:        The GeneXpert MRSA Assay (FDA approved for NASAL specimens only), is one component of a comprehensive MRSA colonization surveillance program. It is not intended to diagnose MRSA infection nor to guide or monitor treatment for MRSA infections. Performed at [facility], [address] [place], [place] [address]     RADIOLOGY:  Dg Chest 1 View  Result Date: 04/26/2018 CLINICAL DATA:  Chest pain and shortness of breath for 2 days, initial encounter EXAM: CHEST  1 VIEW COMPARISON:  04/21/2018 FINDINGS: Cardiac  shadow is stable. Lungs are well aerated bilaterally. Patchy opacities are again identified throughout both lungs stable from the prior exam. No new  focal abnormality is seen. No bony abnormality is noted. IMPRESSION: Stable patchy opacities bilaterally. Electronically Signed   By: Alcide Clever M.D.   On: 04/26/2018 01:11   Ct Angio Chest Pe W And/or Wo Contrast  Result Date: 04/26/2018 CLINICAL DATA:  63 y/o M; Chest pain, complex, intermediate/high prob of ACS/PE/AAS. EXAM: CT ANGIOGRAPHY CHEST WITH CONTRAST TECHNIQUE: Multidetector CT imaging of the chest was performed using the standard protocol during bolus administration of intravenous contrast. Multiplanar CT image reconstructions and MIPs were obtained to evaluate the vascular anatomy. CONTRAST:  75mL OMNIPAQUE IOHEXOL 350 MG/ML SOLN COMPARISON:  09/29/2016 CT angiogram of chest for dissection. 04/26/2018, 04/21/2018, 03/21/2018 chest radiographs. FINDINGS: Cardiovascular: Satisfactory opacification of the pulmonary arteries to the segmental level. No evidence of pulmonary embolism. Mild cardiomegaly. No pericardial effusion. Coronary artery calcific atherosclerosis. Normal caliber thoracic aorta and main pulmonary artery. Mediastinum/Nodes: No enlarged mediastinal, hilar, or axillary lymph nodes. Thyroid gland, trachea, and esophagus demonstrate no significant findings. Lungs/Pleura: Coarse reticular and ground-glass opacities with peripheral, peribronchovascular, and basilar distribution as well as subpleural sparing and banding. No consolidation, effusion, or pneumothorax. Upper Abdomen: No acute abnormality. Musculoskeletal: No chest wall abnormality. No acute or significant osseous findings. Review of the MIP images confirms the above findings. IMPRESSION: 1. No pulmonary embolus identified. 2. Findings probably representing late organizing pneumonia which can be seen with atypical pneumonia including viral pneumonia, drug reaction, autoimmune  disease, inhalational injury, and can be idiopathic. Similar findings are present on prior chest radiographs given differences in technique. 3. Mild cardiomegaly and coronary artery calcific atherosclerosis. Electronically Signed   By: Mitzi Hansen M.D.   On: 04/26/2018 02:17    EKG:   Orders placed or performed during the hospital encounter of 04/26/18  . EKG 12-Lead  . EKG 12-Lead  . ED EKG 12-Lead  . ED EKG 12-Lead  . EKG      Management plans discussed with the patient, family and they are in agreement.  CODE STATUS:     Code Status Orders  (From admission, onward)         Start     Ordered   04/26/18 0646  Full code  Continuous     04/26/18 0645        Code Status History    Date Active Date Inactive Code Status Order ID Comments User Context   04/22/2018 0124 04/23/2018 1834 Full Code [id number]  [name], MD Inpatient   04/21/2018 2220 04/22/2018 0124 Full Code [id number]  Willy Eddy, MD ED   04/21/2018 2153 04/21/2018 2219 Full Code [id number]  Willy Eddy, MD ED   03/21/2018 1936 03/23/2018 1902 Full Code [id number]  Adrian Saran, MD ED   10/28/2016 1036 10/28/2016 1808 Full Code [id number]  Marykay Lex, MD Inpatient      TOTAL TIME TAKING CARE OF THIS PATIENT: 35 minutes.    Altamese Dilling M.D on 04/27/2018 at 10:53 AM  Between 7am to 6pm - Pager - [phone]  After 6pm go to [email or web address] - password [email or web address]  [facility]  Office  [phone]  CC: Primary care physician; Patient, No Pcp Per   Note: This dictation was prepared with Dragon dictation along with smaller phrase technology. Any transcriptional errors that result from this process are unintentional.

## 2018-04-27 NOTE — Plan of Care (Signed)
  Problem: Clinical Measurements: Goal: Respiratory complications will improve Outcome: Progressing   Problem: Pain Managment: Goal: General experience of comfort will improve Outcome: Progressing   Problem: Safety: Goal: Ability to remain free from injury will improve Outcome: Progressing   Problem: Clinical Measurements: Goal: Ability to maintain a body temperature in the normal range will improve Outcome: Progressing

## 2018-04-29 LAB — BLOOD GAS, VENOUS
Acid-Base Excess: 0 mmol/L (ref 0.0–2.0)
Bicarbonate: 24.8 mmol/L (ref 20.0–28.0)
O2 Saturation: 91.7 %
Patient temperature: 37
pCO2, Ven: 40 mmHg — ABNORMAL LOW (ref 44.0–60.0)
pH, Ven: 7.4 (ref 7.250–7.430)
pO2, Ven: 63 mmHg — ABNORMAL HIGH (ref 32.0–45.0)

## 2018-05-01 LAB — CULTURE, BLOOD (ROUTINE X 2)
Culture: NO GROWTH
Culture: NO GROWTH
Special Requests: ADEQUATE

## 2018-05-28 DIAGNOSIS — I219 Acute myocardial infarction, unspecified: Secondary | ICD-10-CM | POA: Insufficient documentation

## 2018-05-28 DIAGNOSIS — F1090 Alcohol use, unspecified, uncomplicated: Secondary | ICD-10-CM | POA: Insufficient documentation

## 2018-06-28 ENCOUNTER — Telehealth: Payer: Self-pay | Admitting: Pharmacy Technician

## 2018-06-28 NOTE — Telephone Encounter (Signed)
Patient failed to provide requested financial documentation.  Financial documentation is required in order to determine patient's eligibility for [facility] program.  No additional medication assistance will be provided until patient provides requested financial documentation.  Patient notified by letter.  Hadlyn Amero, CPhT Recertification Specialist 

## 2018-07-05 ENCOUNTER — Telehealth: Payer: Self-pay | Admitting: Pharmacy Technician

## 2018-07-05 NOTE — Telephone Encounter (Signed)
Spoke with Mr. Jason Wiggins.  Mailing him a new patient packet.  Patient stated that he may want to schedule an outreach call, but wants to wait until after he receives the packet of information.  [place] [facility]

## 2018-07-06 ENCOUNTER — Ambulatory Visit: Payer: Self-pay | Admitting: Pharmacy Technician

## 2018-07-06 ENCOUNTER — Other Ambulatory Visit: Payer: Self-pay

## 2018-07-06 DIAGNOSIS — Z79899 Other long term (current) drug therapy: Secondary | ICD-10-CM

## 2018-07-06 NOTE — Progress Notes (Signed)
[place]ke with patient on phone, completed financial assistance application for [facility] due to recent hospital visit.  Patient agreed to be responsible for gathering financial information and forwarding to appropriate department in [facility].    Completed [facility] application, [facility] and contract.  Mailing to patient to sign.  Patient verbally agreed to all terms of the [facility] contract.  Patient to provide notarized letter of support and 2019 taxes.   Provided patient with Civil engineer, contracting based on his particular needs.    Referring patient to [facility].  [place] [facility]

## 2018-08-01 ENCOUNTER — Other Ambulatory Visit: Payer: Self-pay

## 2018-08-01 ENCOUNTER — Ambulatory Visit: Payer: Medicaid Other | Admitting: Pharmacy Technician

## 2018-08-01 DIAGNOSIS — Z79899 Other long term (current) drug therapy: Secondary | ICD-10-CM

## 2018-08-01 NOTE — Progress Notes (Signed)
Completed financial assistance application for [facility] due to recent hospital visit. Patient agreed to be responsible for gathering financial information and forwarding to appropriate department in [facility].    Patient verbally agreed to all terms of the [facility] contract.    Patient approved to receive medication assistance as long as eligibility criteria continues to be met.    Provided patient with Civil engineer, contracting based on his particular needs.    Referred patient for [facility].  Referred patient to [facility]  [place] [facility]

## 2018-08-08 ENCOUNTER — Other Ambulatory Visit: Payer: Self-pay

## 2018-08-08 ENCOUNTER — Emergency Department: Payer: Medicaid Other

## 2018-08-08 ENCOUNTER — Emergency Department
Admission: EM | Admit: 2018-08-08 | Discharge: 2018-08-09 | Disposition: A | Discharge status: Home / Self Care | Payer: Medicaid Other | Attending: Emergency Medicine | Admitting: Emergency Medicine

## 2018-08-08 DIAGNOSIS — R079 Chest pain, unspecified: Secondary | ICD-10-CM | POA: Insufficient documentation

## 2018-08-08 DIAGNOSIS — Z79899 Other long term (current) drug therapy: Secondary | ICD-10-CM | POA: Insufficient documentation

## 2018-08-08 DIAGNOSIS — R05 Cough: Secondary | ICD-10-CM | POA: Insufficient documentation

## 2018-08-08 DIAGNOSIS — I10 Essential (primary) hypertension: Secondary | ICD-10-CM | POA: Insufficient documentation

## 2018-08-08 DIAGNOSIS — R0602 Shortness of breath: Secondary | ICD-10-CM

## 2018-08-08 DIAGNOSIS — F1010 Alcohol abuse, uncomplicated: Secondary | ICD-10-CM | POA: Insufficient documentation

## 2018-08-08 DIAGNOSIS — M79622 Pain in left upper arm: Secondary | ICD-10-CM | POA: Insufficient documentation

## 2018-08-08 DIAGNOSIS — Z9114 Patient's other noncompliance with medication regimen: Secondary | ICD-10-CM | POA: Insufficient documentation

## 2018-08-08 NOTE — ED Triage Notes (Signed)
Pt arrives to ED via POV from home with c/o [facility]. Pt sates he also has pain in his left armpit, "but just when I take a deep breath". Pt states he took 2 Nitro tablets 15 mins PTA for chest pain (pt states the 2 Nitro tablets alleviated his CP). Pt arrives on 2L O2 that he wears chronically for "pneumonia". Pt states he was placed on medications "and they all ran out". Pt is A&O, in NAD; RR even, regular, and unlabored.

## 2018-08-09 LAB — BASIC METABOLIC PANEL
Anion gap: 12 (ref 5–15)
BUN: 17 mg/dL (ref 8–23)
CO2: 28 mmol/L (ref 22–32)
Calcium: 8.7 mg/dL — ABNORMAL LOW (ref 8.9–10.3)
Chloride: 101 mmol/L (ref 98–111)
Creatinine, Ser: 1.01 mg/dL (ref 0.61–1.24)
GFR calc Af Amer: >60 mL/min (ref >60)
GFR calc non Af Amer: >60 mL/min (ref >60)
Glucose, Bld: 104 mg/dL — ABNORMAL HIGH (ref 70–99)
Potassium: 3.3 mmol/L — ABNORMAL LOW (ref 3.5–5.1)
Sodium: 141 mmol/L (ref 135–145)

## 2018-08-09 LAB — TROPONIN I (HIGH SENSITIVITY)
Troponin I (High Sensitivity): 9 ng/L (ref <18)
Troponin I (High Sensitivity): 9 ng/L (ref <18)

## 2018-08-09 LAB — FIBRIN DERIVATIVES D-DIMER (ARMC ONLY): Fibrin derivatives D-dimer (ARMC): 455.49 ng/mL (FEU) (ref 0.00–499.00)

## 2018-08-09 LAB — CBC
HCT: 39.5 % (ref 39.0–52.0)
Hemoglobin: 13.7 g/dL (ref 13.0–17.0)
MCH: 32.4 pg (ref 26.0–34.0)
MCHC: 34.7 g/dL (ref 30.0–36.0)
MCV: 93.4 fL (ref 80.0–100.0)
Platelets: 180 10*3/uL (ref 150–400)
RBC: 4.23 MIL/uL (ref 4.22–5.81)
RDW: 12.7 % (ref 11.5–15.5)
WBC: 4.9 10*3/uL (ref 4.0–10.5)
nRBC: 0 % (ref 0.0–0.2)

## 2018-08-09 MED ORDER — ASPIRIN EC 81 MG PO TBEC: 81.0000 mg | DELAYED_RELEASE_TABLET | Freq: Every day | ORAL | 2 refills | Status: DC

## 2018-08-09 MED ORDER — ASPIRIN 81 MG PO CHEW
324.0000 mg | CHEWABLE_TABLET | Freq: Once | ORAL | Status: AC
  Administered 2018-08-09: 01:00:00 324 mg via ORAL
  Filled 2018-08-09: qty 4

## 2018-08-09 NOTE — ED Notes (Signed)
Patient states having chest pain prior to coming to ED. Patient took 2 nitro pills and that relieved his chest pain. Patient states he think he has pneumonia that has cleared up from April.

## 2018-08-09 NOTE — ED Provider Notes (Signed)
[facility] Emergency Department Provider Note  ____________________________________________  Time seen: Approximately 12:18 AM  I have reviewed the triage vital signs and the nursing notes.   HISTORY  Chief Complaint Shortness of Breath and Arm Pain   HPI Jason CoveFoster Scheeler Jr. is a 63 y.o. male with a history of chronic shortness of breath on 2 L oxygen, coronary artery disease, alcohol abuse, hyperlipidemia, hypertension who presents for evaluation of chest pain.  Patient reports that he has had pleuritic chest pain which he describes as a sharp pain into his left armpit that he has had for over a month since having pneumonia.  The pain is usually present when he is exerting himself.  Earlier this evening patient reports that he was sitting at home when he developed a new chest pain that he described as an elephant sitting on his chest.  He took 1 sublingual nitro and the pain resolved.  The pain then recurred 30 minutes later and he took a second sublingual nitro with resolution of the pain.  He denies dizziness, diaphoresis, shortness of breath, nausea or vomiting associated with this pain.  Patient is noncompliant with his medications for several months.  He has had a mild intermittent cough for over a month.  No fever or chills. No drug use, patient denies history of smoking.  Patient drinks alcohol several days a week, had 5 beer today. At this time he denies any chest pain.  No personal or family history of blood clots, no recent travel immobilization, no leg pain or swelling, no hemoptysis, no exogenous hormones.   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   Substance induced mood disorder (HCC) 08/14/2017   Alcohol abuse 08/14/2017   Unstable angina (HCC) 10/22/2016   Hyperlipidemia 10/22/2016   Medication management 10/22/2016    Past Surgical History:  Procedure Laterality Date   HERNIA REPAIR     Right inguinal   INTRAVASCULAR PRESSURE WIRE/FFR STUDY N/A 10/28/2016   Procedure: INTRAVASCULAR PRESSURE WIRE/FFR STUDY;  Surgeon: Marykay LexHarding, David W, MD;  Location: [facility] INVASIVE CV LAB;  Service: Cardiovascular;  Laterality: N/A;   LEFT HEART CATH AND CORONARY ANGIOGRAPHY N/A 10/28/2016   Procedure: LEFT HEART CATH AND CORONARY ANGIOGRAPHY;  Surgeon: Marykay LexHarding, David W, MD;  [facility];  Service: Cardiovascular;  Laterality: N/A;    Prior to Admission medications   Medication Sig Start Date End Date Taking? Authorizing Provider  albuterol (VENTOLIN HFA) 108 (90 Base) MCG/ACT inhaler Inhale 2 puffs into the lungs every 6 (six) hours as needed for wheezing or shortness of breath. 04/27/18   Altamese DillingVachhani, Vaibhavkumar, MD  aspirin EC 81 MG tablet Take 1 tablet (81 mg total) by mouth daily. 08/09/18 08/09/19  Nita SickleVeronese, [place], MD  folic acid (FOLVITE) 1 MG tablet Take 1 tablet (1 mg total) by mouth daily. 04/27/18   Altamese DillingVachhani, Vaibhavkumar, MD  Ipratropium-Albuterol (COMBIVENT) 20-100 MCG/ACT AERS respimat Inhale 1 puff into the lungs every 6 (six) hours. 04/27/18   Altamese DillingVachhani, Vaibhavkumar, MD  isosorbide mononitrate (IMDUR) 30 MG 24 hr tablet Take 1 tablet (30 mg  total) by mouth daily. 04/27/18   Altamese DillingVachhani, Vaibhavkumar, MD  Multiple Vitamin (MULTIVITAMIN WITH MINERALS) TABS tablet Take 1 tablet by mouth daily. 04/24/18   Ramonita LabGouru, Aruna, MD  phosphorus (K PHOS NEUTRAL) [phone] MG tablet Take 2 tablets (500 mg total) by mouth 2 (two) times daily. 04/23/18   Gouru, Deanna ArtisAruna, MD  predniSONE (STERAPRED UNI-PAK 21 TAB) 10 MG (21) TBPK tablet Take 1 tablet (10 mg total) by mouth daily. Take 6  tablets by mouth for 1 day followed by  5 tablets by mouth for 1 day followed by  4 tablets by mouth for 1 day followed by  3 tablets by mouth for 1 day followed by  2 tablets by mouth for 1 day followed by  1 tablet by mouth for a day and stop 04/27/18   Altamese DillingVachhani, Vaibhavkumar, MD  thiamine 100 MG tablet Take 1 tablet (100 mg total) by mouth daily. 04/24/18   Ramonita LabGouru, Aruna, MD    Allergies Patient has no known allergies.  Family History  Problem Relation Age of Onset   Cancer Mother    Cancer Father     Social History Social History   Tobacco Use   Smoking status: Never Smoker   Smokeless tobacco: Never Used  Substance Use Topics   Alcohol use: Yes    Comment: daily, 4 - 5 beers per day   Drug use: No    Review of Systems  Constitutional: Negative for fever. Eyes: Negative for visual changes. ENT: Negative for sore throat. Neck: No neck pain  Cardiovascular: + chest pain. Respiratory: Negative for shortness of breath. Gastrointestinal: Negative for abdominal pain, vomiting or diarrhea. Genitourinary: Negative for dysuria. Musculoskeletal: Negative for back pain. Skin: Negative for rash. Neurological: Negative for headaches, weakness or numbness. Psych: No SI or HI  ____________________________________________   PHYSICAL EXAM:  VITAL SIGNS: ED Triage Vitals  Enc Vitals Group     BP 08/08/18 2348 125/75     Pulse Rate 08/08/18 2348 (!) 106     Resp 08/08/18 2348 20     Temp 08/08/18 2348 98.2 F (36.8 C)     Temp Source 08/08/18 2348 Oral     SpO2 08/08/18 2348 96 %     Weight 08/08/18 2340 265 lb (120.2 kg)     Height 08/08/18 2340 6\' 1"  (1.854 m)     Head Circumference --      Peak Flow --      Pain Score 08/08/18 2339 0     Pain Loc --      Pain Edu? --      Excl. in GC? --     Constitutional: Alert and oriented. Well appearing and in no apparent distress. HEENT:      Head: Normocephalic and atraumatic.         Eyes: Conjunctivae are  normal. Sclera is non-icteric.       Mouth/Throat: Mucous membranes are moist.       Neck: Supple with no signs of meningismus. Cardiovascular: Tachycardic with regular rhythm. No murmurs, gallops, or rubs. 2+ symmetrical distal pulses are present in all extremities. No JVD. Respiratory: Normal respiratory effort. Lungs are clear to auscultation bilaterally. No wheezes, crackles, or rhonchi.  Gastrointestinal: Soft, non tender, and non distended with positive bowel sounds. No rebound or guarding. Musculoskeletal: Nontender with normal range of motion in all extremities. No edema, cyanosis, or erythema of extremities. Neurologic: Normal speech and language. Face is symmetric. Moving all extremities. No gross focal neurologic deficits  are appreciated. Skin: Skin is warm, dry and intact. No rash noted. Psychiatric: Mood and affect are normal. Speech and behavior are normal.  ____________________________________________   LABS (all labs ordered are listed, but only abnormal results are displayed)  Labs Reviewed  BASIC METABOLIC PANEL - Abnormal; Notable for the following components:      Result Value   Potassium 3.3 (*)    Glucose, Bld 104 (*)    Calcium 8.7 (*)    All other components within normal limits  CBC  FIBRIN DERIVATIVES D-DIMER ([facility] ONLY)  TROPONIN I (HIGH SENSITIVITY)  TROPONIN I (HIGH SENSITIVITY)   ____________________________________________  EKG  ED ECG REPORT I, Nita Sicklearolina Natasia Sanko, the attending physician, personally viewed and interpreted this ECG.  Sinus tachycardia, rate of 109, left anterior fascicular block, LVH, left axis deviation, no ST elevations or depressions.  Unchanged from prior. ____________________________________________  RADIOLOGY  I have personally reviewed the images performed during this visit and I agree with the Radiologist's read.   Interpretation by Radiologist:  Dg Chest 1 View  Result Date: 08/09/2018 CLINICAL DATA:  Shortness of  breath EXAM: CHEST  1 VIEW COMPARISON:  04/26/2018, 09/29/2016 FINDINGS: Course bilateral interstitial opacity without significant change since April 2020 radiographs, new since 2018 comparison. No interval consolidation. Stable cardiomediastinal silhouette. No pneumothorax. IMPRESSION: Coarse bilateral interstitial opacities, suggesting interstitial lung disease, without significant change since April 2020 but new since 2018. No acute focal airspace disease. Electronically Signed   By: Jasmine PangKim  Fujinaga M.D.   On: 08/09/2018 00:09     ____________________________________________   PROCEDURES  Procedure(s) performed: None Procedures Critical Care performed:  None ____________________________________________   INITIAL IMPRESSION / ASSESSMENT AND PLAN / ED COURSE   63 y.o. male with a history of chronic shortness of breath on 2 L oxygen, coronary artery disease, alcohol abuse, hyperlipidemia, hypertension who presents for evaluation of chest pain.  Patient seems to have 2 different chest pains.  One has been ongoing for over a month since having pneumonia which he describes as an intermittent sharp pain in his left axilla when he exerts himself.  Patient has no history of PE or DVT.  Was seen here back in April with similar presentation.  At that time underwent a CT angios of the chest which showed pneumonia but no evidence of pulmonary embolism.  He finished a full course of antibiotics.  He is also complaining now of a new episode of severe pressure in the center of his chest which seems to have resolved with nitro.  The pain has fully resolved at this time.  He did have a catheterization back in 2018 showing 60% stenosis of the mid LAD with no stents or angioplasty performed at that time.  He has been noncompliant with his cholesterol medication.  He does not take aspirin.  He does have a history of alcohol abuse and had 5 beers earlier today.  Differential diagnosis includes ACS versus GERD versus  gastritis versus peptic ulcer disease versus PE.  Will check labs.  EKG showing no ischemic changes.  Will monitor on telemetry for recurrence of the pain. Will give ASA.   _________________________ 3:24 AM on 08/09/2018 -----------------------------------------  Patient monitor on telemetry with no evidence of dysrhythmias.  Repeat EKG showing no ischemic changes.  Troponin x2-.  D-dimer negative.  Chest x-ray showing chronic changes of interstitial lung disease which are present in previous x-rays but new since 2018.  Patient has not seen a pulmonologist for this.  Most likely has  interstitial lung disease that is undiagnosed.  Will refer him to pulmonologist.  Recommended following up with his cardiologist.  Discussed my standard return precautions.  Discussed importance of taking daily aspirin especially with a history of coronary artery disease.  As part of my medical decision making, I reviewed the following data within the [facility] notes reviewed and incorporated, Labs reviewed , EKG interpreted , Old EKG reviewed, Old chart reviewed, Radiograph reviewed , Notes from prior ED visits and [place] Controlled Substance Database   Patient was evaluated in Emergency Department today for the symptoms described in the history of present illness. Patient was evaluated in the context of the global COVID-19 pandemic, which necessitated consideration that the patient might be at risk for infection with the SARS-CoV-2 virus that causes COVID-19. Institutional protocols and algorithms that pertain to the evaluation of patients at risk for COVID-19 are in a state of rapid change based on information released by regulatory bodies including the CDC and federal and state organizations. These policies and algorithms were followed during the patient's care in the ED.   ____________________________________________   FINAL CLINICAL IMPRESSION(S) / ED DIAGNOSES   Final diagnoses:  Chest  pain, unspecified type      NEW MEDICATIONS STARTED DURING THIS VISIT:  ED Discharge Orders         Ordered    aspirin EC 81 MG tablet  Daily     08/09/18 0326           Note:  This document was prepared using Dragon voice recognition software and may include unintentional dictation errors.    Rudene Re, MD 08/09/18 [phone]

## 2018-08-09 NOTE — Discharge Instructions (Signed)

## 2018-08-10 ENCOUNTER — Other Ambulatory Visit: Payer: Medicaid Other | Admitting: Pharmacist

## 2018-08-21 ENCOUNTER — Other Ambulatory Visit: Payer: Self-pay

## 2018-08-21 ENCOUNTER — Ambulatory Visit: Payer: Medicaid Other | Admitting: Gerontology

## 2018-08-21 ENCOUNTER — Encounter: Payer: Self-pay | Admitting: Gerontology

## 2018-08-21 VITALS — BP 140/88 | HR 87 | Ht 69.0 in | Wt 274.0 lb

## 2018-08-21 DIAGNOSIS — R0781 Pleurodynia: Secondary | ICD-10-CM | POA: Insufficient documentation

## 2018-08-21 DIAGNOSIS — R0602 Shortness of breath: Secondary | ICD-10-CM | POA: Insufficient documentation

## 2018-08-21 DIAGNOSIS — R600 Localized edema: Secondary | ICD-10-CM

## 2018-08-21 DIAGNOSIS — Z8679 Personal history of other diseases of the circulatory system: Secondary | ICD-10-CM

## 2018-08-21 DIAGNOSIS — Z7689 Persons encountering health services in other specified circumstances: Secondary | ICD-10-CM | POA: Insufficient documentation

## 2018-08-21 DIAGNOSIS — F1010 Alcohol abuse, uncomplicated: Secondary | ICD-10-CM

## 2018-08-21 MED ORDER — K PHOS MONO-SOD PHOS DI & MONO 155-852-130 MG PO TABS: 500.0000 mg | ORAL_TABLET | Freq: Two times a day (BID) | ORAL | 0 refills | Status: DC

## 2018-08-21 MED ORDER — THIAMINE HCL 100 MG PO TABS: 100.0000 mg | ORAL_TABLET | Freq: Every day | ORAL | 0 refills | Status: DC

## 2018-08-21 MED ORDER — ISOSORBIDE MONONITRATE ER 30 MG PO TB24: 30.0000 mg | ORAL_TABLET | Freq: Every day | ORAL | 0 refills | Status: DC

## 2018-08-21 MED ORDER — ALBUTEROL SULFATE HFA 108 (90 BASE) MCG/ACT IN AERS: 2.0000 | INHALATION_SPRAY | Freq: Four times a day (QID) | RESPIRATORY_TRACT | 2 refills | Status: DC | PRN

## 2018-08-21 MED ORDER — IPRATROPIUM-ALBUTEROL 20-100 MCG/ACT IN AERS: 1.0000 | INHALATION_SPRAY | Freq: Four times a day (QID) | RESPIRATORY_TRACT | 0 refills | Status: DC

## 2018-08-21 MED ORDER — DICLOFENAC SODIUM 1 % TD GEL: 4.0000 g | Freq: Four times a day (QID) | TRANSDERMAL | 0 refills | Status: DC

## 2018-08-21 MED ORDER — FOLIC ACID 1 MG PO TABS: 1.0000 mg | ORAL_TABLET | Freq: Every day | ORAL | 0 refills | Status: DC

## 2018-08-21 MED ORDER — ADULT MULTIVITAMIN W/MINERALS CH: 1.0000 | ORAL_TABLET | Freq: Every day | ORAL | Status: DC

## 2018-08-21 MED ORDER — ASPIRIN EC 81 MG PO TBEC: 81.0000 mg | DELAYED_RELEASE_TABLET | Freq: Every day | ORAL | 2 refills | Status: DC

## 2018-08-21 NOTE — Progress Notes (Signed)
Patient ID: Jason Wiggins., male   DOB: 06/07/1955, 63 y.o.   MRN: [id number]  Chief Complaint  Patient presents with  . New Patient (Initial Visit)    establish care, pain under left arm since April when he had pneumonia    HPI Jason Wiggins. is a 63 y.o. male who presents to establish care and evaluation of his chronic conditions. He was evaluated at the ED on 08/08/18 for shortness of breath and left arm pain. He on continuous 2 L oxygen via nasal canula. He has a history of coronary artery disease, alcohol abuse, hyperlipidemia and hypertension. During his hospital course, Chest X ray showed Coarse bilateral interstitial opacities, suggesting interstitial lung disease, without significant change since April 2020, but new since 2018. No acute focal airspace disease. Also ECG done showed left anterior fascicular block, LVH, left axis deviation, no ST elevations or depressions. Currently, he reports that he continues to experience left pleuritic chest pain especially during inhalation, and it resolves quickly. He states that the pain has being going on since April of 2020. He endorses shortness of breath with exertion and on 2 L oxygen. He states that he was out of his medications and has being taking only Aspirin 81 mg daily for more than 1 week. He continues to drink 6-12 bottles of beer 3 times a week and expressed the desire to cut back. He denies chest pain, palpitation, fever, chills and no further concerns.  Past Medical History:  Diagnosis Date  . Alcohol abuse    possibly as much as 24 beers per day  . Arthritis   . HTN (hypertension)     Past Surgical History:  Procedure Laterality Date  . HERNIA REPAIR     Right inguinal  . INTRAVASCULAR PRESSURE WIRE/FFR STUDY N/A 10/28/2016   Procedure: INTRAVASCULAR PRESSURE WIRE/FFR STUDY;  Surgeon: Leonie Man, MD;  Location: [place] [facility];  Service: Cardiovascular;  Laterality: N/A;  . LEFT HEART CATH AND CORONARY ANGIOGRAPHY N/A  10/28/2016   Procedure: LEFT HEART CATH AND CORONARY ANGIOGRAPHY;  Surgeon: Leonie Man, MD;  Location: [place] [facility];  Service: Cardiovascular;  Laterality: N/A;    Family History  Problem Relation Age of Onset  . Cancer Mother   . Cancer Father     Social History Social History   Tobacco Use  . Smoking status: Never Smoker  . Smokeless tobacco: Never Used  Substance Use Topics  . Alcohol use: Yes    Comment: daily, 4 - 5 beers per day  . Drug use: No    No Known Allergies  Current Outpatient Medications  Medication Sig Dispense Refill  . albuterol (VENTOLIN HFA) 108 (90 Base) MCG/ACT inhaler Inhale 2 puffs into the lungs every 6 (six) hours as needed for wheezing or shortness of breath. 1 Inhaler 2  . aspirin EC 81 MG tablet Take 1 tablet (81 mg total) by mouth daily. 30 tablet 2  . Ipratropium-Albuterol (COMBIVENT) 20-100 MCG/ACT AERS respimat Inhale 1 puff into the lungs every 6 (six) hours. 1 Inhaler 0  . nitroGLYCERIN (NITROSTAT) 0.4 MG SL tablet Place 0.4 mg under the tongue every 5 (five) minutes as needed for chest pain.    . folic acid (FOLVITE) 1 MG tablet Take 1 tablet (1 mg total) by mouth daily. (Patient not taking: Reported on 08/09/2018) 30 tablet 0  . isosorbide mononitrate (IMDUR) 30 MG 24 hr tablet Take 1 tablet (30 mg total) by mouth daily. (Patient not taking:  Reported on 08/09/2018) 30 tablet 0  . Multiple Vitamin (MULTIVITAMIN WITH MINERALS) TABS tablet Take 1 tablet by mouth daily. (Patient not taking: Reported on 08/09/2018)    . phosphorus (K PHOS NEUTRAL) 155-852-130 MG tablet Take 2 tablets (500 mg total) by mouth 2 (two) times daily. (Patient not taking: Reported on 08/09/2018) 4 tablet 0  . thiamine 100 MG tablet Take 1 tablet (100 mg total) by mouth daily. (Patient not taking: Reported on 08/09/2018) 30 tablet 0   No current facility-administered medications for this visit.     Review of Systems Review of Systems  Constitutional: Negative.    HENT: Negative.   Eyes: Negative.   Respiratory: Positive for shortness of breath and wheezing (intermittent at night).   Cardiovascular: Positive for leg swelling.  Gastrointestinal: Negative.   Endocrine: Negative.   Genitourinary: Negative.   Musculoskeletal: Positive for arthralgias (chronic arthritis to joints).  Neurological: Negative.   Psychiatric/Behavioral: Negative.     Height 5\' 9"  (1.753 m), weight 274 lb (124.3 kg).  Physical Exam Physical Exam Constitutional:      Appearance: Normal appearance.  HENT:     Head: Normocephalic and atraumatic.     Nose: Nose normal.     Mouth/Throat:     Mouth: Mucous membranes are moist.  Eyes:     Extraocular Movements: Extraocular movements intact.     Pupils: Pupils are equal, round, and reactive to light.  Cardiovascular:     Rate and Rhythm: Normal rate and regular rhythm.     Pulses: Normal pulses.     Heart sounds: Normal heart sounds.  Pulmonary:     Breath sounds: Normal breath sounds.  Musculoskeletal:     Right lower leg: Edema (+1 edema to bil lower extremities) present.     Left lower leg: Edema present.  Neurological:     Mental Status: He is alert.  Psychiatric:        Mood and Affect: Mood normal.     Data Reviewed Labs and past medical history was reviewed.  Assessment and Plan 1. Pleuritic chest pain - He will continue to apply diclofenac to left thoracic area and notify clinic for worsening symptoms. - diclofenac sodium (VOLTAREN) 1 % GEL; Apply 4 g topically 4 (four) times daily.  Dispense: 50 g; Refill: 0  2. Encounter to establish care - Some routine labs will be checked. - Lipid panel; Future - HgB A1c; Future  3. History of coronary artery disease - He will continue on current regimen and was encouraged to complete charity care application for Cardiology referral due to abnormal ECG. - aspirin EC 81 MG tablet; Take 1 tablet (81 mg total) by mouth daily.  Dispense: 30 tablet; Refill: 2 -  Ambulatory referral to Cardiology  4. Shortness of breath - He was encouraged to complete charity care application for Pulmonology referral due to abnormal chest x ray. - albuterol (VENTOLIN HFA) 108 (90 Base) MCG/ACT inhaler; Inhale 2 puffs into the lungs every 6 (six) hours as needed for wheezing or shortness of breath.  Dispense: 6.7 g; Refill: 2 - Ambulatory referral to Pulmonology  5. Alcohol abuse - He was encouraged to abstain for alcohol consumption, will provide referral to [facility] for substance abuse counseling. - Multiple Vitamin (MULTIVITAMIN WITH MINERALS) TABS tablet; Take 1 tablet by mouth daily.  6. Morbid obesity (HCC) - He was encouraged to continue on weight loss regimen and decrease caloric intake.   7. Edema of both lower extremities --Elevate your legs  up above heart level while resting - Wear compression stockings if standing or walking for a while -Reduce sodium intake and limit fluid intake -Exercise daily as tolerated.  Follow up 09/18/18 or if symptom worsens  Diyana Starrett E Jamyrah Saur 08/21/2018, 12:44 PM

## 2018-08-22 ENCOUNTER — Other Ambulatory Visit: Payer: Self-pay

## 2018-08-22 ENCOUNTER — Other Ambulatory Visit: Payer: Medicaid Other

## 2018-08-22 DIAGNOSIS — F1010 Alcohol abuse, uncomplicated: Secondary | ICD-10-CM

## 2018-08-22 DIAGNOSIS — Z8679 Personal history of other diseases of the circulatory system: Secondary | ICD-10-CM

## 2018-08-22 DIAGNOSIS — R600 Localized edema: Secondary | ICD-10-CM | POA: Insufficient documentation

## 2018-08-22 DIAGNOSIS — R0602 Shortness of breath: Secondary | ICD-10-CM

## 2018-08-22 DIAGNOSIS — Z7689 Persons encountering health services in other specified circumstances: Secondary | ICD-10-CM

## 2018-08-22 MED ORDER — THIAMINE HCL 100 MG PO TABS: 100.0000 mg | ORAL_TABLET | Freq: Every day | ORAL | 0 refills | Status: DC

## 2018-08-22 MED ORDER — IPRATROPIUM-ALBUTEROL 20-100 MCG/ACT IN AERS: 1.0000 | INHALATION_SPRAY | Freq: Four times a day (QID) | RESPIRATORY_TRACT | 0 refills | Status: DC

## 2018-08-22 MED ORDER — ISOSORBIDE MONONITRATE ER 30 MG PO TB24: 30.0000 mg | ORAL_TABLET | Freq: Every day | ORAL | 0 refills | Status: DC

## 2018-08-22 MED ORDER — K PHOS MONO-SOD PHOS DI & MONO 155-852-130 MG PO TABS: 500.0000 mg | ORAL_TABLET | Freq: Two times a day (BID) | ORAL | 0 refills | Status: DC

## 2018-08-22 MED ORDER — FOLIC ACID 1 MG PO TABS: 1.0000 mg | ORAL_TABLET | Freq: Every day | ORAL | 0 refills | Status: DC

## 2018-08-22 NOTE — Patient Instructions (Signed)

## 2018-08-23 LAB — LIPID PANEL
Chol/HDL Ratio: 4.5 ratio (ref 0.0–5.0)
Cholesterol, Total: 218 mg/dL — ABNORMAL HIGH (ref 100–199)
HDL: 48 mg/dL (ref >39)
LDL Calculated: 110 mg/dL — ABNORMAL HIGH (ref 0–99)
Triglycerides: 300 mg/dL — ABNORMAL HIGH (ref 0–149)
VLDL Cholesterol Cal: 60 mg/dL — ABNORMAL HIGH (ref 5–40)

## 2018-08-23 LAB — HEMOGLOBIN A1C
Est. average glucose Bld gHb Est-mCnc: 108 mg/dL
Hgb A1c MFr Bld: 5.4 % (ref 4.8–5.6)

## 2018-08-24 ENCOUNTER — Other Ambulatory Visit: Payer: Self-pay | Admitting: Gerontology

## 2018-08-24 DIAGNOSIS — E785 Hyperlipidemia, unspecified: Secondary | ICD-10-CM

## 2018-08-24 MED ORDER — PRAVASTATIN SODIUM 20 MG PO TABS: 10.0000 mg | ORAL_TABLET | Freq: Every day | ORAL | 0 refills | Status: DC

## 2018-09-18 ENCOUNTER — Encounter: Payer: Self-pay | Admitting: Gerontology

## 2018-09-18 ENCOUNTER — Other Ambulatory Visit: Payer: Self-pay

## 2018-09-18 ENCOUNTER — Ambulatory Visit: Payer: Medicaid Other | Admitting: Gerontology

## 2018-09-18 VITALS — BP 142/88 | HR 86 | Temp 98.2°F | Ht 69.0 in | Wt 279.0 lb

## 2018-09-18 DIAGNOSIS — Z8679 Personal history of other diseases of the circulatory system: Secondary | ICD-10-CM

## 2018-09-18 DIAGNOSIS — L237 Allergic contact dermatitis due to plants, except food: Secondary | ICD-10-CM | POA: Insufficient documentation

## 2018-09-18 DIAGNOSIS — R600 Localized edema: Secondary | ICD-10-CM

## 2018-09-18 DIAGNOSIS — R0602 Shortness of breath: Secondary | ICD-10-CM

## 2018-09-18 DIAGNOSIS — E785 Hyperlipidemia, unspecified: Secondary | ICD-10-CM

## 2018-09-18 DIAGNOSIS — F1010 Alcohol abuse, uncomplicated: Secondary | ICD-10-CM

## 2018-09-18 DIAGNOSIS — R079 Chest pain, unspecified: Secondary | ICD-10-CM | POA: Insufficient documentation

## 2018-09-18 MED ORDER — NITROGLYCERIN 0.4 MG SL SUBL: 0.4000 mg | SUBLINGUAL_TABLET | SUBLINGUAL | 2 refills | Status: DC | PRN

## 2018-09-18 MED ORDER — ADULT MULTIVITAMIN W/MINERALS CH: 1.0000 | ORAL_TABLET | Freq: Every day | ORAL | 2 refills | Status: DC

## 2018-09-18 MED ORDER — CHLORTHALIDONE 25 MG PO TABS: 12.5000 mg | ORAL_TABLET | Freq: Every day | ORAL | 0 refills | Status: DC

## 2018-09-18 MED ORDER — TRIAMCINOLONE ACETONIDE 0.1 % EX CREA: 1.0000 "application " | TOPICAL_CREAM | Freq: Two times a day (BID) | CUTANEOUS | 0 refills | Status: DC

## 2018-09-18 MED ORDER — THIAMINE HCL 100 MG PO TABS: 100.0000 mg | ORAL_TABLET | Freq: Every day | ORAL | 2 refills | Status: DC

## 2018-09-18 MED ORDER — FOLIC ACID 1 MG PO TABS: 1.0000 mg | ORAL_TABLET | Freq: Every day | ORAL | 2 refills | Status: DC

## 2018-09-18 MED ORDER — K PHOS MONO-SOD PHOS DI & MONO 155-852-130 MG PO TABS: 500.0000 mg | ORAL_TABLET | Freq: Two times a day (BID) | ORAL | 0 refills | Status: DC

## 2018-09-18 MED ORDER — THIAMINE HCL 100 MG PO TABS: 100.0000 mg | ORAL_TABLET | Freq: Every day | ORAL | 0 refills | Status: DC

## 2018-09-18 MED ORDER — NITROGLYCERIN 0.4 MG SL SUBL: 0.4000 mg | SUBLINGUAL_TABLET | SUBLINGUAL | 1 refills | Status: DC | PRN

## 2018-09-18 MED ORDER — ADULT MULTIVITAMIN W/MINERALS CH: 1.0000 | ORAL_TABLET | Freq: Every day | ORAL | Status: DC

## 2018-09-18 MED ORDER — PRAVASTATIN SODIUM 20 MG PO TABS: 10.0000 mg | ORAL_TABLET | Freq: Every day | ORAL | 2 refills | Status: DC

## 2018-09-18 MED ORDER — FOLIC ACID 1 MG PO TABS: 1.0000 mg | ORAL_TABLET | Freq: Every day | ORAL | 0 refills | Status: DC

## 2018-09-18 MED ORDER — PRAVASTATIN SODIUM 20 MG PO TABS: 10.0000 mg | ORAL_TABLET | Freq: Every day | ORAL | 0 refills | Status: DC

## 2018-09-18 NOTE — Patient Instructions (Signed)

## 2018-09-18 NOTE — Progress Notes (Signed)
Established Patient Office Visit  Subjective:  Patient ID: Jason Wiggins., male    DOB: 21-May-1955  Age: 63 y.o. MRN: [id number]  CC:  Chief Complaint  Patient presents with  . Hypertension  . Hyperlipidemia    HPI [name] presents for follow up of hyperlipidemia, hypertension, CAD and shortness of breath. He reports that he continues to experience left pleuritic pain that has being going on since he was discharged from the hospital in April . He states that it hurts when he over exerts himself like breathing hard or when he coughs. He reports that he is still waiting to be notified by Pulmonology office. He reports that he continues to experience an episode of non radiating mid sternal chest pain daily. He states that it "feels like someone is sitting on his chest like when he had his heart attack" He denies associated symptoms and states that taking one tablet of nitroglycerin relieves symptoms. He states that he doesn't want to go to the ED because of the bills he has being receiving, but " I will go to the ED if it gets worst'. He states that he stopped taking his 30 mg Isosorbide mononitrate due to headache and he will follow up with Cardiologist on 10/15/2018. He continues to experience shortness of breath with exertion, and continues on 2L oxygen via nasal canula. He reports of having scattered pruritic erythematous rash to left upper arm due to contact with poison oak ivy. He states that he has decreased his consumption of alcoholic beverage to 5 cans a week. He denies palpitation, dizziness, fever, chills and no further concerns.  Past Medical History:  Diagnosis Date  . Alcohol abuse    possibly as much as 24 beers per day  . Arthritis   . HTN (hypertension)     Past Surgical History:  Procedure Laterality Date  . HERNIA REPAIR     Right inguinal  . INTRAVASCULAR PRESSURE WIRE/FFR STUDY N/A 10/28/2016   Procedure: INTRAVASCULAR PRESSURE WIRE/FFR STUDY;  Surgeon:  Leonie Man, MD;  Location: [place] [facility];  Service: Cardiovascular;  Laterality: N/A;  . LEFT HEART CATH AND CORONARY ANGIOGRAPHY N/A 10/28/2016   Procedure: LEFT HEART CATH AND CORONARY ANGIOGRAPHY;  Surgeon: Leonie Man, MD;  Location: [place] [facility];  Service: Cardiovascular;  Laterality: N/A;    Family History  Problem Relation Age of Onset  . Cancer Mother   . Cancer Father     Social History   Socioeconomic History  . Marital status: Legally Separated    Spouse name: Not on file  . Number of children: 2  . Years of education: Not on file  . Highest education level: Not on file  Occupational History  . Not on file  Social Needs  . Financial resource strain: Not on file  . Food insecurity    Worry: Not on file    Inability: Not on file  . Transportation needs    Medical: Not on file    Non-medical: Not on file  Tobacco Use  . Smoking status: Never Smoker  . Smokeless tobacco: Never Used  Substance and Sexual Activity  . Alcohol use: Yes    Comment: daily, 4 - 5 beers per day  . Drug use: No  . Sexual activity: Not on file  Lifestyle  . Physical activity    Days per week: Not on file    Minutes per session: Not on file  . Stress: Not on  file  Relationships  . Social Herbalist on phone: Not on file    Gets together: Not on file    Attends religious service: Not on file    Active member of club or organization: Not on file    Attends meetings of clubs or organizations: Not on file    Relationship status: Not on file  . Intimate partner violence    Fear of current or ex partner: Not on file    Emotionally abused: Not on file    Physically abused: Not on file    Forced sexual activity: Not on file  Other Topics Concern  . Not on file  Social History Narrative   Lives with sister.  Operates heavy equipment.      Outpatient Medications Prior to Visit  Medication Sig Dispense Refill  . albuterol (VENTOLIN HFA) 108 (90 Base)  MCG/ACT inhaler Inhale 2 puffs into the lungs every 6 (six) hours as needed for wheezing or shortness of breath. 6.7 g 2  . aspirin EC 81 MG tablet Take 1 tablet (81 mg total) by mouth daily. 30 tablet 2  . diclofenac sodium (VOLTAREN) 1 % GEL Apply 4 g topically 4 (four) times daily. 50 g 0  . Ipratropium-Albuterol (COMBIVENT) 20-100 MCG/ACT AERS respimat Inhale 1 puff into the lungs every 6 (six) hours. 4 g 0  . folic acid (FOLVITE) 1 MG tablet Take 1 tablet (1 mg total) by mouth daily. 30 tablet 0  . Multiple Vitamin (MULTIVITAMIN WITH MINERALS) TABS tablet Take 1 tablet by mouth daily.    . nitroGLYCERIN (NITROSTAT) 0.4 MG SL tablet Place 0.4 mg under the tongue every 5 (five) minutes as needed for chest pain.    . phosphorus (K PHOS NEUTRAL) 155-852-130 MG tablet Take 2 tablets (500 mg total) by mouth 2 (two) times daily. 4 tablet 0  . pravastatin (PRAVACHOL) 20 MG tablet Take 0.5 tablets (10 mg total) by mouth daily. 30 tablet 0  . thiamine 100 MG tablet Take 1 tablet (100 mg total) by mouth daily. 30 tablet 0  . isosorbide mononitrate (IMDUR) 30 MG 24 hr tablet Take 1 tablet (30 mg total) by mouth daily. (Patient not taking: Reported on 09/18/2018) 30 tablet 0   No facility-administered medications prior to visit.     No Known Allergies  ROS Review of Systems  Constitutional: Negative.   Respiratory: Positive for shortness of breath (on 2L oxygen).   Cardiovascular: Positive for chest pain.  Musculoskeletal: Negative.   Skin: Positive for rash (scattered erythematous rash to left upper arm).  Neurological: Negative.   Psychiatric/Behavioral: Negative.       Objective:    Physical Exam  Constitutional: He appears well-developed and well-nourished.  HENT:  Head: Normocephalic.  Eyes: Pupils are equal, round, and reactive to light. EOM are normal.  Neck: Normal range of motion. Neck supple.  Cardiovascular: Normal rate and regular rhythm.  Pulmonary/Chest: Effort normal and  breath sounds normal.  Abdominal: Soft. Bowel sounds are normal.  Musculoskeletal:        General: Edema (+1 bil lower extremity) present.  Neurological: He is alert.  Skin: Skin is warm and dry. Rash (scattered erythematous rash to left upper arm) noted.  Psychiatric: He has a normal mood and affect. His behavior is normal. Judgment and thought content normal.    BP (!) 142/88 (BP Location: Left Arm, Patient Position: Sitting)   Pulse 86   Temp 98.2 F (36.8 C)   Ht  _0  (1.753 m)   Wt 279 lb (126.6 kg)   SpO2 97% Comment: on 2L  BMI 41.20 kg/m  Wt Readings from Last 3 Encounters:  09/18/18 279 lb (126.6 kg)  08/21/18 274 lb (124.3 kg)  08/08/18 265 lb (120.2 kg)   He was encouraged to continue on his weight loss regimen.  Health Maintenance Due  Topic Date Due  . Hepatitis C Screening  Mar 01, 1955  . TETANUS/TDAP  06/13/1975  . COLONOSCOPY  06/13/2006  . INFLUENZA VACCINE  08/11/2018    There are no preventive care reminders to display for this patient.  Lab Results  Component Value Date   TSH 1.733 04/26/2018   Lab Results  Component Value Date   WBC 4.9 08/09/2018   HGB 13.7 08/09/2018   HCT 39.5 08/09/2018   MCV 93.4 08/09/2018   PLT 180 08/09/2018   Lab Results  Component Value Date   NA 141 08/09/2018   K 3.3 (L) 08/09/2018   CO2 28 08/09/2018   GLUCOSE 104 (H) 08/09/2018   BUN 17 08/09/2018   CREATININE 1.01 08/09/2018   BILITOT 0.5 04/26/2018   ALKPHOS 75 04/26/2018   AST 17 04/26/2018   ALT 25 04/26/2018   PROT 7.0 04/26/2018   ALBUMIN 3.8 04/26/2018   CALCIUM 8.7 (L) 08/09/2018   ANIONGAP 12 08/09/2018   Lab Results  Component Value Date   CHOL 218 (H) 08/22/2018   Lab Results  Component Value Date   HDL 48 08/22/2018   Lab Results  Component Value Date   LDLCALC 110 (H) 08/22/2018   Lab Results  Component Value Date   TRIG 300 (H) 08/22/2018   Lab Results  Component Value Date   CHOLHDL 4.5 08/22/2018   Lab Results   Component Value Date   HGBA1C 5.4 08/22/2018      Assessment & Plan:    1. Elevated lipids - He was advised to continue on current treatment regimen. -Low fat Diet, like low fat dairy products eg skimmed milk -Avoid any fried food -Regular exercise/walk -Goal for Total Cholesterol is less than 200 -Goal for bad cholesterol LDL is less than 70 -Goal for Good cholesterol HDL is more than 45 -Goal for Triglyceride is less than 150 - pravastatin (PRAVACHOL) 20 MG tablet; Take 0.5 tablets (10 mg total) by mouth daily.  Dispense: 30 tablet; Refill: 2  2. Alcohol abuse - He was commended for cutting down on his alcoholic beverage consumption. - Multiple Vitamin (MULTIVITAMIN WITH MINERALS) TABS tablet; Take 1 tablet by mouth daily.  Dispense: 30 tablet; Refill: 2 - thiamine 100 MG tablet; Take 1 tablet (100 mg total) by mouth daily.  Dispense: 30 tablet; Refill: 2 - folic acid (FOLVITE) 1 MG tablet; Take 1 tablet (1 mg total) by mouth daily.  Dispense: 30 tablet; Refill: 2  3. History of coronary artery disease - He will continue on current treatment regimen until Chemistry is done. - phosphorus (K PHOS NEUTRAL) 155-852-130 MG tablet; Take 2 tablets (500 mg total) by mouth 2 (two) times daily.  Dispense: 4 tablet; Refill: 0  4. Shortness of breath - He will continue on 2L oxygen and referral for Pulmonology has being sent.  5. Morbid obesity ([place]) -Eat more salad without dressing or with low fat [place] dressing -Avoid and juices, or sodas or any regular sweets/cakes/candies -Decrease the amount of bread, rice, potatoes, pasta or similar carbohydrates rich food -Small portion of food -Nothing fried -Grilled or baked chicken, fish or [place]  can be used, avoid red meat -Walk regularly as tolerated.    6. Edema of both lower extremities - He was advised to elevate legs while sitting down and wear compression stockings. Will check BNP to rule out CHF. - chlorthalidone (HYGROTON)  25 MG tablet; Take 0.5 tablets (12.5 mg total) by mouth daily.  Dispense: 30 tablet; Refill: 0  7. Contact dermatitis due to poison ivy - He will apply triamcinolone and notify clinic for worsening symptoms. - triamcinolone cream (KENALOG) 0.1 %; Apply 1 application topically 2 (two) times daily.  Dispense: 30 g; Refill: 0  8. Chest pain on exertion - He was advised to go to the ED, routine labs was checked today and EKG was ordered.. - Comp Met (CMET); Future - CK Total (and CKMB); Future - B Nat Peptide; Future    Follow-up: Return in about 4 weeks (around 10/16/2018), or if symptoms worsen or fail to improve.    Andros Channing Jerold Coombe, NP

## 2018-09-19 ENCOUNTER — Other Ambulatory Visit: Payer: Medicaid Other

## 2018-09-19 DIAGNOSIS — R079 Chest pain, unspecified: Secondary | ICD-10-CM

## 2018-09-20 ENCOUNTER — Telehealth: Payer: Self-pay | Admitting: Pharmacist

## 2018-09-20 NOTE — Telephone Encounter (Signed)
09/20/2018 8:41:36 AM - Combivent Respimat new med  09/20/2018 Received pharmacy printout from [facility]--Combivent Respimat Inhale one puff into the lungs every 6 hours #3, printed [employer] application for [place] to sign.Delos Haring

## 2018-09-21 LAB — COMPREHENSIVE METABOLIC PANEL WITH GFR
ALT: 55 IU/L — ABNORMAL HIGH (ref 0–44)
AST: 39 IU/L (ref 0–40)
Albumin/Globulin Ratio: 2.2 (ref 1.2–2.2)
Albumin: 4.3 g/dL (ref 3.8–4.8)
Alkaline Phosphatase: 73 IU/L (ref 39–117)
BUN/Creatinine Ratio: 16 (ref 10–24)
BUN: 15 mg/dL (ref 8–27)
Bilirubin Total: 0.4 mg/dL (ref 0.0–1.2)
CO2: 25 mmol/L (ref 20–29)
Calcium: 9.3 mg/dL (ref 8.6–10.2)
Chloride: 103 mmol/L (ref 96–106)
Creatinine, Ser: 0.92 mg/dL (ref 0.76–1.27)
GFR calc Af Amer: 103 mL/min/1.73
GFR calc non Af Amer: 89 mL/min/1.73
Globulin, Total: 2 g/dL (ref 1.5–4.5)
Glucose: 95 mg/dL (ref 65–99)
Potassium: 4.1 mmol/L (ref 3.5–5.2)
Sodium: 142 mmol/L (ref 134–144)
Total Protein: 6.3 g/dL (ref 6.0–8.5)

## 2018-09-21 LAB — CK TOTAL AND CKMB (NOT AT ARMC)
CK-MB Index: 3.3 ng/mL (ref 0.0–10.4)
Total CK: 195 U/L (ref 41–331)

## 2018-09-21 LAB — BRAIN NATRIURETIC PEPTIDE: BNP: 16.1 pg/mL (ref 0.0–100.0)

## 2018-10-02 ENCOUNTER — Telehealth: Payer: Self-pay | Admitting: Pharmacist

## 2018-10-02 NOTE — Telephone Encounter (Signed)
10/02/2018 11:26:09 AM - Combivent Respimat to [employer]  10/02/2018 Faxed [employer] application for Combivent Respimat 100/20 mcg Inhale 1 puff every 6 hours, also patient has signed a letter for medication to be shipped to our office.Delos Haring

## 2018-10-14 NOTE — Progress Notes (Signed)
Cardiology Office Note   Date:  10/15/2018   ID:  Jason Wiggins., DOB 1955/12/03, MRN [id number]  PCP:  Jason Reusing, NP  Cardiologist:   Minus Breeding, MD    Chief Complaint  Patient presents with  . Chest Pain      History of Present Illness: Jason Wiggins. is a 63 y.o. male who presents for evaluation of chest pain.  He has was in the ED for this in 2018.   It was thought that he was having unstable angina.  However, he left the hospital AMA.  He had a CT which demonstrated LAD calcification. Cath demonstrated a 60% LAD lesion.  He had multiple ED visits with acute dyspnea since then.  He has had pneumonia and bronchitis.  His most recent visit was July of this year.  I reviewed these records for this visit.  There were no ischemic EKG changes and hs trop was negative.     He said that he has been on oxygen since his April visit.  He gets chest pain with activities such as walking to his mailbox.  He is now out of work because of his hospitalizations and his ER visits.  I did go back and look at his troponin as well as a negative with his visits.  He gets sharp pain on his left side when he walks.  He is not having any jaw or arm pain.  He has chronic dyspnea.  He has not had any fevers or chills.  He has a chronic mild cough.  He has had some weight gain because he is not active.  He has had some mild lower extremity swelling.  He is not describing PND or orthopnea.   Past Medical History:  Diagnosis Date  . Alcohol abuse    possibly as much as 24 beers per day  . Arthritis   . HTN (hypertension)     Past Surgical History:  Procedure Laterality Date  . HERNIA REPAIR     Right inguinal  . INTRAVASCULAR PRESSURE WIRE/FFR STUDY N/A 10/28/2016   Procedure: INTRAVASCULAR PRESSURE WIRE/FFR STUDY;  Surgeon: Leonie Man, MD;  Location: [place] [facility];  Service: Cardiovascular;  Laterality: N/A;  . LEFT HEART CATH AND CORONARY ANGIOGRAPHY N/A 10/28/2016   Procedure: LEFT HEART CATH AND CORONARY ANGIOGRAPHY;  Surgeon: Leonie Man, MD;  Location: [place] [facility];  Service: Cardiovascular;  Laterality: N/A;     Current Outpatient Medications  Medication Sig Dispense Refill  . albuterol (VENTOLIN HFA) 108 (90 Base) MCG/ACT inhaler Inhale 2 puffs into the lungs every 6 (six) hours as needed for wheezing or shortness of breath. 6.7 g 2  . aspirin EC 81 MG tablet Take 1 tablet (81 mg total) by mouth daily. 30 tablet 2  . chlorthalidone (HYGROTON) 25 MG tablet Take 1 tablet (25 mg total) by mouth daily. 30 tablet 0  . Multiple Vitamin (MULTIVITAMIN WITH MINERALS) TABS tablet Take 1 tablet by mouth daily. 30 tablet 2  . nitroGLYCERIN (NITROSTAT) 0.4 MG SL tablet Place 1 tablet (0.4 mg total) under the tongue every 5 (five) minutes as needed for chest pain. 30 tablet 1  . pravastatin (PRAVACHOL) 20 MG tablet Take 1 tablet (20 mg total) by mouth daily. 30 tablet 2  . triamcinolone cream (KENALOG) 0.1 % Apply 1 application topically 2 (two) times daily. 30 g 0  . Ipratropium-Albuterol (COMBIVENT) 20-100 MCG/ACT AERS respimat Inhale 1 puff into the lungs every  6 (six) hours. 4 g 0   No current facility-administered medications for this visit.     Allergies:   Patient has no known allergies.    Social History:  The patient  reports that he has never smoked. He has never used smokeless tobacco. He reports current alcohol use. He reports that he does not use drugs.   Family History:  The patient's family history includes Cancer in his father and mother.   No early CAD.      ROS:  Please see the history of present illness.   Otherwise, review of systems are positive for allergies.   All other systems are reviewed and negative.    PHYSICAL EXAM: VS:  BP (!) 149/89   Pulse 87   Temp 98.4 F (36.9 C)   Ht 5\' 9"  (1.753 m)   Wt 272 lb 6.4 oz (123.6 kg)   SpO2 99%   BMI 40.23 kg/m  , BMI Body mass index is 40.23 kg/m. GENERAL:  Well appearing  NECK:  No jugular venous distention, waveform within normal limits, carotid upstroke brisk and symmetric, no bruits, no thyromegaly LUNGS:  Clear to auscultation bilaterally BACK:  No CVA tenderness CHEST:  Unremarkable HEART:  PMI not displaced or sustained,S1 and S2 within normal limits, no S3, no S4, no clicks, no rubs, no murmurs ABD:  Flat, positive bowel sounds normal in frequency in pitch, no bruits, no rebound, no guarding, no midline pulsatile mass, no hepatomegaly, no splenomegaly EXT:  2 plus pulses throughout, no edema, no cyanosis no clubbing   EKG:  EKG is not ordered today. The ekg ordered 08/08/2018 demonstrates sinus rhythm, rate 109, axis within normal. Intervals within normal limits, no acute ST-T wave changes.   Recent Labs: 04/22/2018: Magnesium 2.3 04/26/2018: TSH 1.733 08/09/2018: Hemoglobin 13.7; Platelets 180 09/19/2018: ALT 55; BNP 16.1; BUN 15; Creatinine, Ser 0.92; Potassium 4.1; Sodium 142    Lipid Panel    Component Value Date/Time   CHOL 218 (H) 08/22/2018 1023   TRIG 300 (H) 08/22/2018 1023   HDL 48 08/22/2018 1023   CHOLHDL 4.5 08/22/2018 1023   CHOLHDL 3.9 04/23/2018 0448   VLDL 20 04/23/2018 0448   LDLCALC 110 (H) 08/22/2018 1023      Wt Readings from Last 3 Encounters:  10/15/18 272 lb 6.4 oz (123.6 kg)  09/18/18 279 lb (126.6 kg)  08/21/18 274 lb (124.3 kg)      Other studies Reviewed: Additional studies/ records that were reviewed today include: ED records Review of the above records demonstrates:  Please see elsewhere in the note.     ASSESSMENT AND PLAN:   CHEST PAIN:   Chest pain is atypical.  He did have nonobstructive coronary disease however.  He needs further screening.  He would be able walk a treadmill.  Therefore, he will have a 10/21/18.  HTN: His blood pressure is not well controlled.  I am going to increase his chlorthalidone to whole pill once a day.  He has been intolerant of other medications.  He stopped  taking isosorbide.  He stopped taking Norvasc.  Could tolerate beta-blocker.  DYSLIPIDEMIA: He does tolerate a low dose of statin and I have convinced him to continue this.  He is not quite at target.  I would have him increase his pravastatin to a whole pill daily.   Current medicines are reviewed at length with the patient today.  The patient does not have concerns regarding medicines.  The following changes have  been made: As above  Labs/ tests ordered today include:   Orders Placed This Encounter  Procedures  . NM Myocar Multi W/Spect W/Wall Motion / EF  . Ambulatory referral to Pulmonology     Disposition:   FU with me 12 months.       Signed, Rollene RotundaJames Latisha Lasch, MD  10/15/2018 11:57 AM    [facility]

## 2018-10-15 ENCOUNTER — Ambulatory Visit (INDEPENDENT_AMBULATORY_CARE_PROVIDER_SITE_OTHER): Payer: Self-pay | Admitting: Cardiology

## 2018-10-15 ENCOUNTER — Telehealth: Payer: Self-pay | Admitting: *Deleted

## 2018-10-15 ENCOUNTER — Other Ambulatory Visit: Payer: Self-pay

## 2018-10-15 ENCOUNTER — Encounter: Payer: Self-pay | Admitting: Cardiology

## 2018-10-15 VITALS — BP 149/89 | HR 87 | Temp 98.4°F | Ht 69.0 in | Wt 272.4 lb

## 2018-10-15 DIAGNOSIS — R0602 Shortness of breath: Secondary | ICD-10-CM

## 2018-10-15 DIAGNOSIS — R600 Localized edema: Secondary | ICD-10-CM

## 2018-10-15 DIAGNOSIS — E785 Hyperlipidemia, unspecified: Secondary | ICD-10-CM

## 2018-10-15 DIAGNOSIS — R072 Precordial pain: Secondary | ICD-10-CM

## 2018-10-15 MED ORDER — CHLORTHALIDONE 25 MG PO TABS: 25.0000 mg | ORAL_TABLET | Freq: Every day | ORAL | 0 refills | Status: DC

## 2018-10-15 MED ORDER — PRAVASTATIN SODIUM 20 MG PO TABS: 20.0000 mg | ORAL_TABLET | Freq: Every day | ORAL | 2 refills | Status: DC

## 2018-10-15 NOTE — Telephone Encounter (Signed)
Spoke with patient regarding appointment with [facility]--phone [phone] on 10/23/18 at 11:30 am----Myoview at [facility] Monday 10/22/18 at 9:30 am--arrive 9:15 for check in--NPO after midnight.

## 2018-10-15 NOTE — Patient Instructions (Signed)
Medication Instructions:  Increase Chlorathalidone to one whole tablet daily (25mg ) Increase Pravastatin to one whole tablet daily. (20mg )   If you need a refill on your cardiac medications before your next appointment, please call your pharmacy.   Lab work: NONE  Testing/Procedures: Your physician has requested that you have a lexiscan myoview. For further information please visit [email or web address]. Please follow instruction sheet, as given.   Follow-Up: At [facility], you and your health needs are our priority.  As part of our continuing mission to provide you with exceptional heart care, we have created designated Provider Care Teams.  These Care Teams include your primary Cardiologist (physician) and Advanced Practice Providers (APPs -  Physician Assistants and Nurse Practitioners) who all work together to provide you with the care you need, when you need it. You will need a follow up appointment in 12 months.  Please call our office 2 months in advance to schedule this appointment.  You may see Dr. Percival Spanish or one of the following Advanced Practice Providers on your designated Care Team:   Rosaria Ferries, PA-C Jory Sims, DNP, ANP  Any Other Special Instructions Will Be Listed Below (If Applicable). You have been referred to [employer].

## 2018-10-16 ENCOUNTER — Encounter: Payer: Self-pay | Admitting: Gerontology

## 2018-10-16 ENCOUNTER — Ambulatory Visit: Payer: Medicaid Other | Admitting: Gerontology

## 2018-10-16 VITALS — BP 139/88 | HR 77 | Temp 99.8°F | Ht 69.0 in | Wt 273.1 lb

## 2018-10-16 DIAGNOSIS — I10 Essential (primary) hypertension: Secondary | ICD-10-CM | POA: Insufficient documentation

## 2018-10-16 DIAGNOSIS — R0602 Shortness of breath: Secondary | ICD-10-CM

## 2018-10-16 DIAGNOSIS — E785 Hyperlipidemia, unspecified: Secondary | ICD-10-CM

## 2018-10-16 DIAGNOSIS — F1010 Alcohol abuse, uncomplicated: Secondary | ICD-10-CM

## 2018-10-16 DIAGNOSIS — R079 Chest pain, unspecified: Secondary | ICD-10-CM

## 2018-10-16 MED ORDER — CHLORTHALIDONE 25 MG PO TABS: 25.0000 mg | ORAL_TABLET | Freq: Every day | ORAL | 2 refills | Status: DC

## 2018-10-16 NOTE — Patient Instructions (Signed)

## 2018-10-16 NOTE — Progress Notes (Signed)
Established Patient Office Visit  Subjective:  Patient ID: Jason Wiggins., male    DOB: Feb 01, 1955  Age: 63 y.o. MRN: [id number]  CC:  Chief Complaint  Patient presents with  . Hyperlipidemia  . Chest Pain    HPI Jason Wiggins. presents for follow up of hyperlipidemia, hypertension and shortness of breath. He reports that he continues to experience left pleuritic chest pain and shortness of breath when he over exerts himself such as walking to his mail box. He uses 2 L oxygen via nasal canula. He has an appointment with [facility] on 10/23/2018.    He was evaluated by the cardiologist Dr Daiva Nakayama on 10/15/2018 for chest pain. His chest pain is atypical and Dr Antoine Poche recommended that he has [employer], increase his chlorthalidone to 25 mg and Pravastatin to 20 mg. He reports that he will undergo a stress test on 10/22/18. His ALT done on 09/19/18 was 55 and he continues to drink 2-3 bottles of beer weekly, stating that he's cutting back on his alcohol consumption. He declined Influenza vaccine and Colonoscopy screening. He states that he's doing much better, compliant with his medications, denies light headedness, myalgia, and offers no further complaint.    Past Medical History:  Diagnosis Date  . Alcohol abuse    possibly as much as 24 beers per day  . Arthritis   . HTN (hypertension)     Past Surgical History:  Procedure Laterality Date  . HERNIA REPAIR     Right inguinal  . INTRAVASCULAR PRESSURE WIRE/FFR STUDY N/A 10/28/2016   Procedure: INTRAVASCULAR PRESSURE WIRE/FFR STUDY;  Surgeon: Marykay Lex, MD;  Location: [facility] INVASIVE CV LAB;  Service: Cardiovascular;  Laterality: N/A;  . LEFT HEART CATH AND CORONARY ANGIOGRAPHY N/A 10/28/2016   Procedure: LEFT HEART CATH AND CORONARY ANGIOGRAPHY;  Surgeon: Marykay Lex, MD;  Location: [facility] INVASIVE CV LAB;  Service: Cardiovascular;  Laterality: N/A;    Family History  Problem Relation Age of Onset  . Cancer  Mother   . Cancer Father     Social History   Socioeconomic History  . Marital status: Legally Separated    Spouse name: Not on file  . Number of children: 2  . Years of education: Not on file  . Highest education level: Not on file  Occupational History  . Not on file  Social Needs  . Financial resource strain: Not on file  . Food insecurity    Worry: Not on file    Inability: Not on file  . Transportation needs    Medical: Not on file    Non-medical: Not on file  Tobacco Use  . Smoking status: Never Smoker  . Smokeless tobacco: Never Used  Substance and Sexual Activity  . Alcohol use: Yes    Comment: daily, 4 - 5 beers per day  . Drug use: No  . Sexual activity: Not on file  Lifestyle  . Physical activity    Days per week: Not on file    Minutes per session: Not on file  . Stress: Not on file  Relationships  . Social Musician on phone: Not on file    Gets together: Not on file    Attends religious service: Not on file    Active member of club or organization: Not on file    Attends meetings of clubs or organizations: Not on file    Relationship status: Not on file  . Intimate partner  violence    Fear of current or ex partner: Not on file    Emotionally abused: Not on file    Physically abused: Not on file    Forced sexual activity: Not on file  Other Topics Concern  . Not on file  Social History Narrative   Lives with sister.  Lost his job.      Outpatient Medications Prior to Visit  Medication Sig Dispense Refill  . albuterol (VENTOLIN HFA) 108 (90 Base) MCG/ACT inhaler Inhale 2 puffs into the lungs every 6 (six) hours as needed for wheezing or shortness of breath. 6.7 g 2  . aspirin EC 81 MG tablet Take 1 tablet (81 mg total) by mouth daily. 30 tablet 2  . Ipratropium-Albuterol (COMBIVENT) 20-100 MCG/ACT AERS respimat Inhale 1 puff into the lungs every 6 (six) hours. 4 g 0  . Multiple Vitamin (MULTIVITAMIN WITH MINERALS) TABS tablet Take 1  tablet by mouth daily. 30 tablet 2  . nitroGLYCERIN (NITROSTAT) 0.4 MG SL tablet Place 1 tablet (0.4 mg total) under the tongue every 5 (five) minutes as needed for chest pain. 30 tablet 1  . pravastatin (PRAVACHOL) 20 MG tablet Take 1 tablet (20 mg total) by mouth daily. 30 tablet 2  . triamcinolone cream (KENALOG) 0.1 % Apply 1 application topically 2 (two) times daily. 30 g 0  . chlorthalidone (HYGROTON) 25 MG tablet Take 1 tablet (25 mg total) by mouth daily. 30 tablet 0   No facility-administered medications prior to visit.     No Known Allergies  ROS Review of Systems  Constitutional: Negative.   Respiratory: Positive for shortness of breath.   Cardiovascular: Negative.   Genitourinary: Negative.   Skin: Negative.   Neurological: Negative.   Psychiatric/Behavioral: Negative.       Objective:    Physical Exam  Constitutional: He is oriented to person, place, and time. He appears well-developed.  obese  HENT:  Head: Normocephalic and atraumatic.  Cardiovascular: Normal rate and regular rhythm.  Pulmonary/Chest: Effort normal and breath sounds normal.  Abdominal: Soft.  Central obesity  Musculoskeletal: Normal range of motion.        General: No edema.  Neurological: He is alert and oriented to person, place, and time.  Skin: Skin is warm and dry.  Psychiatric: He has a normal mood and affect. His behavior is normal. Judgment and thought content normal.    BP 139/88 (BP Location: Left Arm, Patient Position: Sitting)   Pulse 77   Temp 99.8 F (37.7 C)   Ht 5\' 9"  (1.753 m)   Wt 273 lb 1.6 oz (123.9 kg)   SpO2 99% Comment: on 2L of oxygen  BMI 40.33 kg/m  Wt Readings from Last 3 Encounters:  10/16/18 273 lb 1.6 oz (123.9 kg)  10/15/18 272 lb 6.4 oz (123.6 kg)  09/18/18 279 lb (126.6 kg)   He was encouraged to continue on current weight loss regimen.  Health Maintenance Due  Topic Date Due  . Hepatitis C Screening  [birth year]  . TETANUS/TDAP  06/13/1975     There are no preventive care reminders to display for this patient.  Lab Results  Component Value Date   TSH 1.733 04/26/2018   Lab Results  Component Value Date   WBC 4.9 08/09/2018   HGB 13.7 08/09/2018   HCT 39.5 08/09/2018   MCV 93.4 08/09/2018   PLT 180 08/09/2018   Lab Results  Component Value Date   NA 142 09/19/2018   K 4.1 09/19/2018  CO2 25 09/19/2018   GLUCOSE 95 09/19/2018   BUN 15 09/19/2018   CREATININE 0.92 09/19/2018   BILITOT 0.4 09/19/2018   ALKPHOS 73 09/19/2018   AST 39 09/19/2018   ALT 55 (H) 09/19/2018   PROT 6.3 09/19/2018   ALBUMIN 4.3 09/19/2018   CALCIUM 9.3 09/19/2018   ANIONGAP 12 08/09/2018   Lab Results  Component Value Date   CHOL 218 (H) 08/22/2018   Lab Results  Component Value Date   HDL 48 08/22/2018   Lab Results  Component Value Date   LDLCALC 110 (H) 08/22/2018   Lab Results  Component Value Date   TRIG 300 (H) 08/22/2018   Lab Results  Component Value Date   CHOLHDL 4.5 08/22/2018   Lab Results  Component Value Date   HGBA1C 5.4 08/22/2018      Assessment & Plan:    1. Hyperlipidemia, unspecified hyperlipidemia type - He will continue on current treatment regimen, -Low fat Diet, like low fat dairy products eg skimmed milk -Avoid any fried food -Regular exercise/walk -Goal for Total Cholesterol is less than 200 -Goal for bad cholesterol LDL is less than 70 -Goal for Good cholesterol HDL is more than 45 -Goal for Triglyceride is less than 150   2. Essential hypertension - His blood pressure is controlled, he will continue on current treatment regimen. -Low salt DASH diet -Take medications regularly on time -Exercise regularly as tolerated -Check blood pressure at least once a week at home or a nearby pharmacy and record -Goal is less than 150/90 and normal blood pressure is 120/80 - chlorthalidone (HYGROTON) 25 MG tablet; Take 1 tablet (25 mg total) by mouth daily.  Dispense: 30 tablet; Refill:  2  3. Shortness of breath - He will continue to use 2 L oxygen and albuterol as needed. - He was encouraged to follow up with Pulmonologist on 10/23/2018  4. Chest pain on exertion - He will follow up on 10/22/18 for Stress test and was advised to go to the ED for worsening symptoms.  5. Alcohol abuse - His ALT was 55, and he was strongly encouraged on alcohol abstinence.    Follow-up: Return in about 9 weeks (around 12/18/2018), or if symptoms worsen or fail to improve.    Tamaka Sawin Trellis PaganiniE Whalen Trompeter, NP

## 2018-10-22 ENCOUNTER — Ambulatory Visit
Admission: RE | Admit: 2018-10-22 | Discharge: 2018-10-22 | Disposition: A | Discharge status: Home / Self Care | Payer: Self-pay | Source: Ambulatory Visit | Attending: Cardiology | Admitting: Cardiology

## 2018-10-22 ENCOUNTER — Other Ambulatory Visit: Payer: Self-pay

## 2018-10-22 DIAGNOSIS — R072 Precordial pain: Secondary | ICD-10-CM

## 2018-10-22 LAB — NM MYOCAR MULTI W/SPECT W/WALL MOTION / EF
LV dias vol: 77 mL (ref 62–150)
LV sys vol: 31 mL
Peak HR: 106 {beats}/min
Percent HR: 67 %
Rest HR: 92 {beats}/min
SDS: 0
SRS: 2
SSS: 0
TID: 1.09

## 2018-10-22 MED ORDER — REGADENOSON 0.4 MG/5ML IV SOLN
0.4000 mg | Freq: Once | INTRAVENOUS | Status: AC
  Administered 2018-10-22: 0.4 mg via INTRAVENOUS

## 2018-10-22 MED ORDER — TECHNETIUM TC 99M TETROFOSMIN IV KIT
9.7800 | PACK | Freq: Once | INTRAVENOUS | Status: AC | PRN
  Administered 2018-10-22: 9.78 via INTRAVENOUS

## 2018-10-22 MED ORDER — TECHNETIUM TC 99M TETROFOSMIN IV KIT
31.6400 | PACK | Freq: Once | INTRAVENOUS | Status: AC | PRN
  Administered 2018-10-22: 11:00:00 31.64 via INTRAVENOUS

## 2018-10-23 ENCOUNTER — Ambulatory Visit (INDEPENDENT_AMBULATORY_CARE_PROVIDER_SITE_OTHER): Payer: Self-pay | Admitting: Pulmonary Disease

## 2018-10-23 ENCOUNTER — Encounter: Payer: Self-pay | Admitting: Pulmonary Disease

## 2018-10-23 VITALS — BP 142/90 | HR 91 | Temp 97.9°F | Ht 69.0 in | Wt 270.0 lb

## 2018-10-23 DIAGNOSIS — J849 Interstitial pulmonary disease, unspecified: Secondary | ICD-10-CM

## 2018-10-23 DIAGNOSIS — J9611 Chronic respiratory failure with hypoxia: Secondary | ICD-10-CM

## 2018-10-23 DIAGNOSIS — E662 Morbid (severe) obesity with alveolar hypoventilation: Secondary | ICD-10-CM

## 2018-10-23 DIAGNOSIS — R0600 Dyspnea, unspecified: Secondary | ICD-10-CM

## 2018-10-23 NOTE — Patient Instructions (Signed)
1.  We will schedule breathing test, nighttime oxygen test and heart test.  2.  He will also be scheduled for a special CT scan of the chest that will tell me about potential scarring in your lungs.  3.  You did not require oxygen today during the day.  We are checking nighttime oxygen levels to see if you still require this.  4.  We will see you in follow-up in 6 to 8 weeks time.

## 2018-10-23 NOTE — Progress Notes (Signed)
Subjective:    Patient ID: Jason Wiggins., male    DOB: 1955/11/16, 63 y.o.   MRN: [id number]  HPI Patient is a 63 year old lifelong never smoker who presents for evaluation of dyspnea and ongoing oxygen use.  He is kindly referred by Waymon Amato, NP.  The patient states that he has been short of breath since his admission at [facility] in April 2020.  He was admitted on 21 April 2018 and discharged on 23 April 2018 to be readmitted on 16 April and discharged on 17 April.  He was discharged on oxygen, prednisone and antibiotics.  CT scan of the chest was consistent with atypical pneumonia type pattern.  He has been on oxygen since then and complaining bitterly of dyspnea.  He is a very difficult historian and very evasive with history.  He has been evaluated by cardiology but he has not had reevaluation with regards to determining whether he still requires oxygen or not.  He complains of chest pain which has been evaluated by cardiology as noted.  He also complains of significant gastroesophageal reflux symptoms.  He is cantankerous during his visit and argumentative.  Recently has had no fevers, chills or sweats.  He has had a dry cough but no sputum production.  No hemoptysis. No weight loss or anorexia.  He complains of "wheezing" at times.  Past Medical History:  Diagnosis Date  . Alcohol abuse    possibly as much as 24 beers per day  . Arthritis   . HTN (hypertension)    Past Surgical History:  Procedure Laterality Date  . HERNIA REPAIR     Right inguinal  . INTRAVASCULAR PRESSURE WIRE/FFR STUDY N/A 10/28/2016   Procedure: INTRAVASCULAR PRESSURE WIRE/FFR STUDY;  Surgeon: Leonie Man, MD;  Location: [place];  Service: Cardiovascular;  Laterality: N/A;  . LEFT HEART CATH AND CORONARY ANGIOGRAPHY N/A 10/28/2016   Procedure: LEFT HEART CATH AND CORONARY ANGIOGRAPHY;  Surgeon: Leonie Man, MD;  Location: [place];  Service: Cardiovascular;  Laterality: N/A;    Family History  Problem Relation Age of Onset  . Cancer Mother   . Cancer Father    Social History   Tobacco Use  . Smoking status: Never Smoker  . Smokeless tobacco: Never Used  Substance Use Topics  . Alcohol use: Yes    Comment: daily, 4 - 5 beers per day  . Drug use: No   No Known Allergies  Current medications have been reviewed and are as noted.  Review of Systems A 10 point review of systems was performed and it is as noted above otherwise negative.    Objective:   Physical Exam BP (!) 142/90 (BP Location: Left Arm, Cuff Size: Normal)   Pulse 91   Temp 97.9 F (36.6 C) (Temporal)   Ht 5\' 9"  (1.753 m)   Wt 270 lb (122.5 kg)   SpO2 96%   BMI 39.87 kg/m   GENERAL: Obese gentleman, no acute respiratory distress, argumentative, poor eye contact. HEAD: Normocephalic, atraumatic.  EYES: Pupils equal, round, reactive to light.  No scleral icterus.  MOUTH: Nose/mouth/throat not examined due to masking requirements for COVID 19. NECK: Supple. No thyromegaly. Trachea midline. No JVD.  No adenopathy. PULMONARY: Good air entry bilaterally, coarse breath sounds, no other adventitious sounds. CARDIOVASCULAR: S1 and S2. Regular rate and rhythm.  No rubs murmurs gallops heard. GASTROINTESTINAL: Protuberant abdomen, otherwise benign. MUSCULOSKELETAL: No joint deformity, no clubbing, no edema.  NEUROLOGIC: Awake, alert,  speech is fluent.  Fully ambulatory.  No gait disturbance noted.  No focal deficits. SKIN: Intact,warm,dry.  Motor exam no rashes. PSYCH: Mood is irascible.  Behavior impulsive.  Ambulatory oximetry on room air was performed today: The patient's baseline oxygen saturation on room air was 86% HOWEVER, on ambulation his oxygen saturation improved to 94% throughout the test.  This indicates normal lung physiology.  I suspect that he has an element of obesity with alveolar hypoventilation which improves when his minute volume increases.  Representative CT slice from  April 2020 admission, independently reviewed.      Assessment & Plan:     ICD-10-CM   1. Dyspnea, unspecified type  R06.00 Pulmonary Function Test [facility] Only    ECHOCARDIOGRAM COMPLETE  2. Interstitial pulmonary disease (HCC)  J84.9 CT Chest High Resolution    Pulse oximetry, overnight  3. Chronic respiratory failure with hypoxia (HCC)  J96.11   4. 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  Pulmonary physiology appears to be normal and that he increases his oxygen saturation with activity.  He does not seem to require oxygen during exercise.  Suspect a lot of these issues are secondary to his obesity and recommend weight loss.  It does appear that he had an atypical pneumonia in April 2020.  His CT was actually consistent with COVID-19 presentation however he was PCR negative at that time.  However recall that early testing was missing some Covid diagnoses.  In any event he appears to have recovered from that but may have some residual interstitial lung disease/postinflammatory fibrosis.  For this reason we will obtain a high-resolution CT to evaluate this.  Because of his issues with mild hypoxemia at rest we will obtain a pulse oximetry overnight.  He may also have some cardiac cause of dyspnea and will obtain a 2D echo.  Follow-up will be in 6 to 8 weeks time he is to contact us prior to that time should any new difficulties arise.   Gailen Shelter, MD [facility]   *This note was dictated using voice recognition software/Dragon.  Despite best efforts to proofread, errors can occur which can change the meaning.  Any change was purely unintentional.

## 2018-10-30 ENCOUNTER — Ambulatory Visit
Admission: RE | Admit: 2018-10-30 | Discharge: 2018-10-30 | Disposition: A | Discharge status: Home / Self Care | Payer: Self-pay | Source: Ambulatory Visit | Attending: Pulmonary Disease | Admitting: Pulmonary Disease

## 2018-10-30 ENCOUNTER — Other Ambulatory Visit: Payer: Self-pay

## 2018-10-30 DIAGNOSIS — R0600 Dyspnea, unspecified: Secondary | ICD-10-CM | POA: Insufficient documentation

## 2018-10-30 NOTE — Progress Notes (Signed)
*  PRELIMINARY RESULTS* Echocardiogram 2D Echocardiogram has been performed.  Wallie Char Oakley Kossman 10/30/2018, 10:43 AM

## 2018-11-29 ENCOUNTER — Telehealth: Payer: Self-pay | Admitting: Pulmonary Disease

## 2018-11-29 ENCOUNTER — Other Ambulatory Visit: Payer: Self-pay

## 2018-11-29 NOTE — Telephone Encounter (Signed)
Pt is aware of date/time of covid test.   

## 2018-12-03 ENCOUNTER — Other Ambulatory Visit
Admission: RE | Admit: 2018-12-03 | Discharge: 2018-12-03 | Disposition: A | Discharge status: Home / Self Care | Payer: Medicaid Other | Source: Ambulatory Visit | Attending: Pulmonary Disease | Admitting: Pulmonary Disease

## 2018-12-03 ENCOUNTER — Other Ambulatory Visit: Payer: Self-pay

## 2018-12-03 DIAGNOSIS — Z01812 Encounter for preprocedural laboratory examination: Secondary | ICD-10-CM | POA: Insufficient documentation

## 2018-12-03 DIAGNOSIS — Z20828 Contact with and (suspected) exposure to other viral communicable diseases: Secondary | ICD-10-CM | POA: Diagnosis not present

## 2018-12-03 LAB — SARS CORONAVIRUS 2 (TAT 6-24 HRS): SARS Coronavirus 2: NEGATIVE

## 2018-12-04 ENCOUNTER — Ambulatory Visit: Payer: Self-pay | Attending: Pulmonary Disease

## 2018-12-04 DIAGNOSIS — R0600 Dyspnea, unspecified: Secondary | ICD-10-CM | POA: Insufficient documentation

## 2018-12-04 MED ORDER — ALBUTEROL SULFATE (2.5 MG/3ML) 0.083% IN NEBU
2.5000 mg | INHALATION_SOLUTION | Freq: Once | RESPIRATORY_TRACT | Status: AC
  Administered 2018-12-04: 2.5 mg via RESPIRATORY_TRACT
  Filled 2018-12-04: qty 3

## 2018-12-10 ENCOUNTER — Ambulatory Visit
Admission: RE | Admit: 2018-12-10 | Discharge: 2018-12-10 | Disposition: A | Discharge status: Home / Self Care | Payer: Self-pay | Source: Ambulatory Visit | Attending: Pulmonary Disease | Admitting: Pulmonary Disease

## 2018-12-10 ENCOUNTER — Other Ambulatory Visit: Payer: Self-pay

## 2018-12-10 DIAGNOSIS — J849 Interstitial pulmonary disease, unspecified: Secondary | ICD-10-CM | POA: Insufficient documentation

## 2018-12-12 ENCOUNTER — Encounter: Payer: Self-pay | Admitting: Pulmonary Disease

## 2018-12-12 ENCOUNTER — Ambulatory Visit (INDEPENDENT_AMBULATORY_CARE_PROVIDER_SITE_OTHER): Payer: Self-pay | Admitting: Pulmonary Disease

## 2018-12-12 ENCOUNTER — Other Ambulatory Visit: Payer: Self-pay

## 2018-12-12 VITALS — BP 128/88 | HR 71 | Temp 98.6°F | Ht 69.0 in | Wt 271.2 lb

## 2018-12-12 DIAGNOSIS — E662 Morbid (severe) obesity with alveolar hypoventilation: Secondary | ICD-10-CM

## 2018-12-12 DIAGNOSIS — J8410 Pulmonary fibrosis, unspecified: Secondary | ICD-10-CM

## 2018-12-12 DIAGNOSIS — J849 Interstitial pulmonary disease, unspecified: Secondary | ICD-10-CM

## 2018-12-12 DIAGNOSIS — I5189 Other ill-defined heart diseases: Secondary | ICD-10-CM

## 2018-12-12 DIAGNOSIS — J683 Other acute and subacute respiratory conditions due to chemicals, gases, fumes and vapors: Secondary | ICD-10-CM

## 2018-12-12 DIAGNOSIS — R0600 Dyspnea, unspecified: Secondary | ICD-10-CM

## 2018-12-12 MED ORDER — BUDESONIDE-FORMOTEROL FUMARATE 160-4.5 MCG/ACT IN AERO: 2.0000 | INHALATION_SPRAY | Freq: Two times a day (BID) | RESPIRATORY_TRACT | 0 refills | Status: DC

## 2018-12-12 MED ORDER — BUDESONIDE-FORMOTEROL FUMARATE 160-4.5 MCG/ACT IN AERO: 2.0000 | INHALATION_SPRAY | Freq: Two times a day (BID) | RESPIRATORY_TRACT | 5 refills | Status: DC

## 2018-12-12 NOTE — Patient Instructions (Signed)
1.  As you were instructed on an earlier visit, you do not need oxygen during the day.  2.  We will check your oxygen at nighttime to see if you still needed.  3.  You will be placed on Symbicort 160/4.5, 2 inhalations twice a day.    4. Continue using Combivent 1 spray every 6 hours ONLY IF NEEDED for shortness of breath.  5.  We will see you in follow-up in 3 months time either with me or the nurse practitioner.

## 2018-12-12 NOTE — Progress Notes (Signed)
Patient seen in the office today and instructed on use of Symbicort.  Patient expressed understanding and demonstrated technique. ° °

## 2018-12-18 ENCOUNTER — Other Ambulatory Visit: Payer: Self-pay

## 2018-12-18 ENCOUNTER — Ambulatory Visit: Payer: Medicaid Other | Admitting: Gerontology

## 2018-12-18 ENCOUNTER — Encounter: Payer: Self-pay | Admitting: Gerontology

## 2018-12-18 VITALS — BP 148/92 | HR 80 | Temp 99.0°F | Ht 69.0 in | Wt 267.0 lb

## 2018-12-18 DIAGNOSIS — R0602 Shortness of breath: Secondary | ICD-10-CM

## 2018-12-18 DIAGNOSIS — R05 Cough: Secondary | ICD-10-CM | POA: Insufficient documentation

## 2018-12-18 DIAGNOSIS — J3489 Other specified disorders of nose and nasal sinuses: Secondary | ICD-10-CM

## 2018-12-18 DIAGNOSIS — I10 Essential (primary) hypertension: Secondary | ICD-10-CM

## 2018-12-18 DIAGNOSIS — R059 Cough, unspecified: Secondary | ICD-10-CM

## 2018-12-18 DIAGNOSIS — J00 Acute nasopharyngitis [common cold]: Secondary | ICD-10-CM | POA: Insufficient documentation

## 2018-12-18 DIAGNOSIS — E785 Hyperlipidemia, unspecified: Secondary | ICD-10-CM

## 2018-12-18 MED ORDER — FLUTICASONE PROPIONATE 50 MCG/ACT NA SUSP: 1.0000 | Freq: Every day | NASAL | 0 refills | Status: DC

## 2018-12-18 MED ORDER — BENZONATATE 100 MG PO CAPS: 100.0000 mg | ORAL_CAPSULE | Freq: Three times a day (TID) | ORAL | 0 refills | Status: DC | PRN

## 2018-12-18 MED ORDER — PRAVASTATIN SODIUM 20 MG PO TABS: 20.0000 mg | ORAL_TABLET | Freq: Every day | ORAL | 3 refills | Status: DC

## 2018-12-18 MED ORDER — CHLORTHALIDONE 25 MG PO TABS: 25.0000 mg | ORAL_TABLET | Freq: Every day | ORAL | 2 refills | Status: DC

## 2018-12-18 NOTE — Patient Instructions (Signed)

## 2018-12-18 NOTE — Progress Notes (Signed)
Established Patient Office Visit  Subjective:  Patient ID: Jason Greenman., male    DOB: 01-27-1955  Age: 63 y.o. MRN: [id number]  CC:  Chief Complaint  Patient presents with  . Hypertension    HPI Jason Cove. presents for follow up of hyperlipidemia, hypertension and shortness of breath. He was seen by the Pulmonologist Dr Jayme Cloud C.L on 10/23/2018 for Dyspnea, breathing test , nighttime oxygen, heart tests  and chest CT scan will be scheduled. He had Echocardiogram done on 10/2018 nd it showed Left Ventricle: Left ventricular ejection fraction, by visual estimation, is 55 to 60%. The left ventricle has normal function. There is borderline left ventricular hypertrophy. Normal left ventricular size. Spectral. Doppler shows Left ventricular diastolic Doppler parameters are consistent with impaired relaxation pattern of LV diastolic filling. Right Ventricle: The right ventricular size is normal. No increase in right ventricular wall thickness. Global. RV systolic function is has low normal systolic function. Left Atrium: Left atrial size was normal in size. Right Atrium: Right atrial size was normal in size. Pericardium: There is no evidence of pericardial effusion. CT scan done on 12/10/2018 showed, 1. Patchy pulmonary parenchymal fibrosis, as discussed above, more organized in appearance than on 04/26/2018 and new from 09/29/2016. 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 Nonspecific interstitial pneumonitis is not excluded. Findings are suggestive of an alternative diagnosis (not UIP) per consensus guidelines: Diagnosis of Idiopathic Pulmonary Fibrosis: An Official ATS/ERS/JRS/ALAT Clinical Practice Guideline. Am Rosezetta Schlatter Crit Care Med Vol 198, Iss 5, [phone], Sep 10 2016. 2. Hepatic steatosis. 3. Aortic atherosclerosis (ICD10-170.0). Coronary artery calcification. The Nuclear stress test done on 10/22/2018 was normal. He followed up with Dr Jayme Cloud on 12/12/2018, he does not  require oxygen during the day and night time oxygen test needs to be done. He will continue using Combivent inhaler 1 spray every 6 hours as needed and Symbicort 160/4.5 2 inhaler bid, and will follow up with her in 3 months. He states that he doesn't use oxygen during the day and he's yet to receive the equipments to perform the night time oxygen test. He states that his breathing is stable, he denies wheezing, chest tightness and shortness of breath.  Currently, he c/o sore throat, sneezing,rhinorrhea, and intermittent productive cough with yellowish sputum that has being going on for 3 days. He denies fever, chills and change in taste or smell. He states that he takes Nyquil with minimal to moderate relief and tested negative for COVID 19 twenty days ago. He states that he's doing well and offers no further complaint.    Past Medical History:  Diagnosis Date  . Alcohol abuse    possibly as much as 24 beers per day  . Arthritis   . HTN (hypertension)     Past Surgical History:  Procedure Laterality Date  . HERNIA REPAIR     Right inguinal  . INTRAVASCULAR PRESSURE WIRE/FFR STUDY N/A 10/28/2016   Procedure: INTRAVASCULAR PRESSURE WIRE/FFR STUDY;  Surgeon: Marykay Lex, MD;  Location: [facility] INVASIVE CV LAB;  Service: Cardiovascular;  Laterality: N/A;  . LEFT HEART CATH AND CORONARY ANGIOGRAPHY N/A 10/28/2016   Procedure: LEFT HEART CATH AND CORONARY ANGIOGRAPHY;  Surgeon: Marykay Lex, MD;  Location: [facility] INVASIVE CV LAB;  Service: Cardiovascular;  Laterality: N/A;    Family History  Problem Relation Age of Onset  . Cancer Mother   . Cancer Father     Social History   Socioeconomic History  . Marital status:  Legally Separated    Spouse name: Not on file  . Number of children: 2  . Years of education: Not on file  . Highest education level: Not on file  Occupational History  . Not on file  Social Needs  . Financial resource strain: Not on file  . Food insecurity    Worry:  Not on file    Inability: Not on file  . Transportation needs    Medical: Not on file    Non-medical: Not on file  Tobacco Use  . Smoking status: Never Smoker  . Smokeless tobacco: Never Used  Substance and Sexual Activity  . Alcohol use: Yes    Comment: daily, 4 - 5 beers per day  . Drug use: No  . Sexual activity: Not on file  Lifestyle  . Physical activity    Days per week: Not on file    Minutes per session: Not on file  . Stress: Not on file  Relationships  . Social Herbalist on phone: Not on file    Gets together: Not on file    Attends religious service: Not on file    Active member of club or organization: Not on file    Attends meetings of clubs or organizations: Not on file    Relationship status: Not on file  . Intimate partner violence    Fear of current or ex partner: Not on file    Emotionally abused: Not on file    Physically abused: Not on file    Forced sexual activity: Not on file  Other Topics Concern  . Not on file  Social History Narrative   Lives with sister.  Lost his job.      Outpatient Medications Prior to Visit  Medication Sig Dispense Refill  . albuterol (VENTOLIN HFA) 108 (90 Base) MCG/ACT inhaler Inhale 2 puffs into the lungs every 6 (six) hours as needed for wheezing or shortness of breath. 6.7 g 2  . aspirin EC 81 MG tablet Take 1 tablet (81 mg total) by mouth daily. 30 tablet 2  . budesonide-formoterol (SYMBICORT) 160-4.5 MCG/ACT inhaler Inhale 2 puffs into the lungs every 12 (twelve) hours. 1 Inhaler 0  . budesonide-formoterol (SYMBICORT) 160-4.5 MCG/ACT inhaler Inhale 2 puffs into the lungs every 12 (twelve) hours. 1 Inhaler 5  . Ipratropium-Albuterol (COMBIVENT) 20-100 MCG/ACT AERS respimat Inhale 1 puff into the lungs every 6 (six) hours. 4 g 0  . Multiple Vitamin (MULTIVITAMIN WITH MINERALS) TABS tablet Take 1 tablet by mouth daily. 30 tablet 2  . nitroGLYCERIN (NITROSTAT) 0.4 MG SL tablet Place 1 tablet (0.4 mg total)  under the tongue every 5 (five) minutes as needed for chest pain. 30 tablet 1  . triamcinolone cream (KENALOG) 0.1 % Apply 1 application topically 2 (two) times daily. 30 g 0  . chlorthalidone (HYGROTON) 25 MG tablet Take 1 tablet (25 mg total) by mouth daily. 30 tablet 2  . pravastatin (PRAVACHOL) 20 MG tablet Take 1 tablet (20 mg total) by mouth daily. 30 tablet 2   No facility-administered medications prior to visit.     No Known Allergies  ROS Review of Systems  Constitutional: Negative.   HENT: Positive for rhinorrhea, sneezing and sore throat. Negative for sinus pressure and sinus pain.   Respiratory: Positive for cough.   Cardiovascular: Negative.   Skin: Negative.   Neurological: Negative.   Psychiatric/Behavioral: Negative.       Objective:    Physical Exam  Constitutional:  He is oriented to person, place, and time. He appears well-developed.  HENT:  Head: Normocephalic and atraumatic.  Cardiovascular: Normal rate and regular rhythm.  Pulmonary/Chest: Effort normal and breath sounds normal. No respiratory distress. He has no wheezes. He has no rales. He exhibits no tenderness.  Neurological: He is alert and oriented to person, place, and time.  Psychiatric: He has a normal mood and affect. His behavior is normal. Judgment and thought content normal.    BP (!) 148/92 (BP Location: Left Arm, Patient Position: Sitting)   Pulse 80   Temp 99 F (37.2 C)   Ht  (1.753 m)   Wt 267 lb (121.1 kg)   SpO2 97%   BMI 39.43 kg/m  Wt Readings from Last 3 Encounters:  12/18/18 267 lb (121.1 kg)  12/12/18 271 lb 3.2 oz (123 kg)  10/23/18 270 lb (122.5 kg)   He lost 4 pounds and was encouraged to continue on his weight loss regimen.  Health Maintenance Due  Topic Date Due  . Hepatitis C Screening  1955/05/26  . TETANUS/TDAP  06/13/1975    There are no preventive care reminders to display for this patient.  Lab Results  Component Value Date   TSH 1.733 04/26/2018    Lab Results  Component Value Date   WBC 4.9 08/09/2018   HGB 13.7 08/09/2018   HCT 39.5 08/09/2018   MCV 93.4 08/09/2018   PLT 180 08/09/2018   Lab Results  Component Value Date   NA 142 09/19/2018   K 4.1 09/19/2018   CO2 25 09/19/2018   GLUCOSE 95 09/19/2018   BUN 15 09/19/2018   CREATININE 0.92 09/19/2018   BILITOT 0.4 09/19/2018   ALKPHOS 73 09/19/2018   AST 39 09/19/2018   ALT 55 (H) 09/19/2018   PROT 6.3 09/19/2018   ALBUMIN 4.3 09/19/2018   CALCIUM 9.3 09/19/2018   ANIONGAP 12 08/09/2018   Lab Results  Component Value Date   CHOL 218 (H) 08/22/2018   Lab Results  Component Value Date   HDL 48 08/22/2018   Lab Results  Component Value Date   LDLCALC 110 (H) 08/22/2018   Lab Results  Component Value Date   TRIG 300 (H) 08/22/2018   Lab Results  Component Value Date   CHOLHDL 4.5 08/22/2018   Lab Results  Component Value Date   HGBA1C 5.4 08/22/2018      Assessment & Plan:    1. Cough, and Sore Throat. - Ddx Possible COVID 19 or Flu ? He was provided with [facility] Department information to schedule COVID 19 test. He will continue on - benzonatate (TESSALON PERLES) 100 MG capsule; Take 1 capsule (100 mg total) by mouth 3 (three) times daily as needed for cough.  Dispense: 20 capsule; Refill: 0, he was educated on medication side effect and was advised to notify clinic. -He was advised to increase fluid intake, adequate rest.  2. Rhinorrhea -He was provided with [facility] Department information to schedule for COVID 19 test - He was advised to increase fluid intake, adequate rest. - fluticasone (FLONASE) 50 MCG/ACT nasal spray; Place 1 spray into both nostrils daily.  Dispense: 1 g; Refill: 0  3. Essential hypertension - His blood pressure was less than 150/90, and he will continue on current treatment regimen. - He was advised to continue on  Low salt DASH diet -Take medications regularly on time -Exercise regularly as  tolerated -Check blood pressure at least once a week at home or  a nearby pharmacy, record and bring log to follow up appointment. -Goal is less than 150/90 and normal blood pressure is less than 120/80 - chlorthalidone (HYGROTON) 25 MG tablet; Take 1 tablet (25 mg total) by mouth daily.  Dispense: 30 tablet; Refill: 2  4. Elevated lipids - He will continue on current treatment regimen and Lipid panel will be rechecked. - He was advised to continue on Low fat Diet, like low fat dairy products eg skimmed milk -Avoid any fried food -Regular exercise/walk -Goal for Total Cholesterol is less than 200 -Goal for bad cholesterol LDL is less than 70 -Goal for Good cholesterol HDL is more than 45 -Goal for Triglyceride is less than 150 - pravastatin (PRAVACHOL) 20 MG tablet; Take 1 tablet (20 mg total) by mouth daily.  Dispense: 30 tablet; Refill: 3  5. Shortness of breath - He was advised to notify Dr Jayme CloudGonzalez clinic with regards to the night time oxygen requirement test. He was advised to notify clinic for shortness of breath. He was advised to keep his folow up appointment with Pulmonologist Dr Jayme CloudGonzalez in 3 months.     Follow-up: Return in about 3 weeks (around 01/08/2019), or if symptoms worsen or fail to improve.    Jason Babler Trellis PaganiniE Naidelyn Parrella, NP

## 2018-12-19 ENCOUNTER — Other Ambulatory Visit: Payer: Self-pay

## 2018-12-19 DIAGNOSIS — Z20822 Contact with and (suspected) exposure to covid-19: Secondary | ICD-10-CM

## 2018-12-21 LAB — NOVEL CORONAVIRUS, NAA: SARS-CoV-2, NAA: NOT DETECTED

## 2019-01-02 ENCOUNTER — Other Ambulatory Visit: Payer: Self-pay | Admitting: Gerontology

## 2019-01-02 DIAGNOSIS — F1010 Alcohol abuse, uncomplicated: Secondary | ICD-10-CM

## 2019-01-08 ENCOUNTER — Ambulatory Visit: Payer: Medicaid Other | Admitting: Gerontology

## 2019-01-08 ENCOUNTER — Other Ambulatory Visit: Payer: Self-pay

## 2019-01-08 VITALS — BP 134/87 | Ht 69.0 in | Wt 269.0 lb

## 2019-01-08 DIAGNOSIS — R059 Cough, unspecified: Secondary | ICD-10-CM

## 2019-01-08 DIAGNOSIS — R05 Cough: Secondary | ICD-10-CM

## 2019-01-08 DIAGNOSIS — R0602 Shortness of breath: Secondary | ICD-10-CM

## 2019-01-08 DIAGNOSIS — E785 Hyperlipidemia, unspecified: Secondary | ICD-10-CM

## 2019-01-08 MED ORDER — PRAVASTATIN SODIUM 20 MG PO TABS: 20.0000 mg | ORAL_TABLET | Freq: Every day | ORAL | 2 refills | Status: DC

## 2019-01-08 NOTE — Progress Notes (Signed)
Established Patient Office Visit  Subjective:  Patient ID: Jason Reiland., male    DOB: April 24, 1955  Age: 63 y.o. MRN: [id number]  CC:  Chief Complaint  Patient presents with  . Hypertension    patient has stopped all BP medications-cause migraines    HPI Jason Wiggins. presents for follow up of cough.  He states that his cough has improved 80%, though he continues to experience intermittent cough with greenish phlegm.he reports that his COVID 19 test was negative.  He states that he stopped taking his chlorthalidone because he experiences migraine 25 minutes after taking it, and he states that he will notify clinic with any increase in his blood pressure. During his follow-up visit with pulmonologist Dr. Donia Ast, on December 12, 2018, it was ordered for his night time oxygen to be checked and he was instructed that he doesn't need oxygen during the day time. Currently he denies shortness of breath, but states that the kit for the night time oxygen test was not delivered to him because of lack of insurance. He denies chest pain, palpitation, light headedness, myalgia, fever and chills. He states that he's doing well and offers no further complaint.  Past Medical History:  Diagnosis Date  . Alcohol abuse    possibly as much as 24 beers per day  . Arthritis   . HTN (hypertension)     Past Surgical History:  Procedure Laterality Date  . HERNIA REPAIR     Right inguinal  . INTRAVASCULAR PRESSURE WIRE/FFR STUDY N/A 10/28/2016   Procedure: INTRAVASCULAR PRESSURE WIRE/FFR STUDY;  Surgeon: Leonie Man, MD;  Location: [place] [facility];  Service: Cardiovascular;  Laterality: N/A;  . LEFT HEART CATH AND CORONARY ANGIOGRAPHY N/A 10/28/2016   Procedure: LEFT HEART CATH AND CORONARY ANGIOGRAPHY;  Surgeon: Leonie Man, MD;  Location: [place] [facility];  Service: Cardiovascular;  Laterality: N/A;    Family History  Problem Relation Age of Onset  . Cancer Mother   . Cancer  Father     Social History   Socioeconomic History  . Marital status: Legally Separated    Spouse name: Not on file  . Number of children: 2  . Years of education: Not on file  . Highest education level: Not on file  Occupational History  . Not on file  Tobacco Use  . Smoking status: Never Smoker  . Smokeless tobacco: Never Used  Substance and Sexual Activity  . Alcohol use: Yes    Comment: daily, 4 - 5 beers per day  . Drug use: No  . Sexual activity: Not on file  Other Topics Concern  . Not on file  Social History Narrative   Lives with sister.  Lost his job.     Social Determinants of Health   Financial Resource Strain:   . Difficulty of Paying Living Expenses: Not on file  Food Insecurity:   . Worried About Charity fundraiser in the Last Year: Not on file  . Ran Out of Food in the Last Year: Not on file  Transportation Needs:   . Lack of Transportation (Medical): Not on file  . Lack of Transportation (Non-Medical): Not on file  Physical Activity:   . Days of Exercise per Week: Not on file  . Minutes of Exercise per Session: Not on file  Stress:   . Feeling of Stress : Not on file  Social Connections:   . Frequency of Communication with Friends and Family: Not  on file  . Frequency of Social Gatherings with Friends and Family: Not on file  . Attends Religious Services: Not on file  . Active Member of Clubs or Organizations: Not on file  . Attends Archivist Meetings: Not on file  . Marital Status: Not on file  Intimate Partner Violence:   . Fear of Current or Ex-Partner: Not on file  . Emotionally Abused: Not on file  . Physically Abused: Not on file  . Sexually Abused: Not on file    Outpatient Medications Prior to Visit  Medication Sig Dispense Refill  . albuterol (VENTOLIN HFA) 108 (90 Base) MCG/ACT inhaler Inhale 2 puffs into the lungs every 6 (six) hours as needed for wheezing or shortness of breath. 6.7 g 2  . benzonatate (TESSALON PERLES)  100 MG capsule Take 1 capsule (100 mg total) by mouth 3 (three) times daily as needed for cough. 20 capsule 0  . budesonide-formoterol (SYMBICORT) 160-4.5 MCG/ACT inhaler Inhale 2 puffs into the lungs every 12 (twelve) hours. 1 Inhaler 0  . budesonide-formoterol (SYMBICORT) 160-4.5 MCG/ACT inhaler Inhale 2 puffs into the lungs every 12 (twelve) hours. 1 Inhaler 5  . fluticasone (FLONASE) 50 MCG/ACT nasal spray Place 1 spray into both nostrils daily. 1 g 0  . Multiple Vitamin (MULTIVITAMIN WITH MINERALS) TABS tablet Take 1 tablet by mouth daily. 30 tablet 2  . nitroGLYCERIN (NITROSTAT) 0.4 MG SL tablet Place 1 tablet (0.4 mg total) under the tongue every 5 (five) minutes as needed for chest pain. 30 tablet 1  . aspirin EC 81 MG tablet Take 1 tablet (81 mg total) by mouth daily. (Patient not taking: Reported on 01/08/2019) 30 tablet 2  . triamcinolone cream (KENALOG) 0.1 % Apply 1 application topically 2 (two) times daily. 30 g 0  . chlorthalidone (HYGROTON) 25 MG tablet Take 1 tablet (25 mg total) by mouth daily. (Patient not taking: Reported on 01/08/2019) 30 tablet 2  . Ipratropium-Albuterol (COMBIVENT) 20-100 MCG/ACT AERS respimat Inhale 1 puff into the lungs every 6 (six) hours. 4 g 0  . pravastatin (PRAVACHOL) 20 MG tablet Take 1 tablet (20 mg total) by mouth daily. (Patient not taking: Reported on 01/08/2019) 30 tablet 3   No facility-administered medications prior to visit.    No Known Allergies  ROS Review of Systems  Constitutional: Negative.   HENT: Negative.   Eyes: Negative.   Respiratory: Positive for cough.   Cardiovascular: Negative.   Skin: Negative.   Neurological: Negative.   Psychiatric/Behavioral: Negative.       Objective:    Physical Exam  Constitutional: He is oriented to person, place, and time. He appears well-developed.  HENT:  Head: Normocephalic and atraumatic.  Cardiovascular: Normal rate and regular rhythm.  Pulmonary/Chest: Effort normal and breath  sounds normal.  Neurological: He is alert and oriented to person, place, and time.  Skin: Skin is warm and dry.  Psychiatric: He has a normal mood and affect. His behavior is normal. Judgment and thought content normal.    BP 134/87 (BP Location: Left Arm, Patient Position: Sitting)   Ht _0  (1.753 m)   Wt 269 lb (122 kg)   BMI 39.72 kg/m  Wt Readings from Last 3 Encounters:  01/08/19 269 lb (122 kg)  12/18/18 267 lb (121.1 kg)  12/12/18 271 lb 3.2 oz (123 kg)   He was encouraged to continue on his weight loss regimen.  Health Maintenance Due  Topic Date Due  . Hepatitis C Screening  1955-05-27  .  TETANUS/TDAP  06/13/1975    There are no preventive care reminders to display for this patient.  Lab Results  Component Value Date   TSH 1.733 04/26/2018   Lab Results  Component Value Date   WBC 4.9 08/09/2018   HGB 13.7 08/09/2018   HCT 39.5 08/09/2018   MCV 93.4 08/09/2018   PLT 180 08/09/2018   Lab Results  Component Value Date   NA 142 09/19/2018   K 4.1 09/19/2018   CO2 25 09/19/2018   GLUCOSE 95 09/19/2018   BUN 15 09/19/2018   CREATININE 0.92 09/19/2018   BILITOT 0.4 09/19/2018   ALKPHOS 73 09/19/2018   AST 39 09/19/2018   ALT 55 (H) 09/19/2018   PROT 6.3 09/19/2018   ALBUMIN 4.3 09/19/2018   CALCIUM 9.3 09/19/2018   ANIONGAP 12 08/09/2018   Lab Results  Component Value Date   CHOL 218 (H) 08/22/2018   Lab Results  Component Value Date   HDL 48 08/22/2018   Lab Results  Component Value Date   LDLCALC 110 (H) 08/22/2018   Lab Results  Component Value Date   TRIG 300 (H) 08/22/2018   Lab Results  Component Value Date   CHOLHDL 4.5 08/22/2018   Lab Results  Component Value Date   HGBA1C 5.4 08/22/2018      Assessment & Plan:    1. Shortness of breath - He was advised to call Dr Patsey Berthold office and notify them about not receiving the night time oxygen test kit. He was advised to notify clinic for any shortness of breath or go to the  ED.  2. Hyperlipidemia, unspecified hyperlipidemia type - He will continue on current treatment regimen and we will recheck - Lipid panel; -Low fat Diet, like low fat dairy products eg skimmed milk Avoid any fried food -Regular exercise/walk -Goal for Total Cholesterol is less than 200 -Goal for bad cholesterol LDL is less than 70 -Goal for Good cholesterol HDL is more than 45 -Goal for Triglyceride is less than 150 - pravastatin (PRAVACHOL) 20 MG tablet; Take 1 tablet (20 mg total) by mouth daily.  Dispense: 30 tablet; Refill: 2   3. Cough - He will continue on current treatment regimen and was advised to notify clinic for worsening symptoms.    Follow-up: Return in about 6 weeks (around 02/20/2019), or if symptoms worsen or fail to improve.    Deja Kaigler Jerold Coombe, NP

## 2019-01-08 NOTE — Patient Instructions (Signed)
Fat and Cholesterol Restricted Eating Plan Getting too much fat and cholesterol in your diet may cause health problems. Choosing the right foods helps keep your fat and cholesterol at normal levels. This can keep you from getting certain diseases. Your doctor may recommend an eating plan that includes:  Total fat: ______% or less of total calories a day.  Saturated fat: ______% or less of total calories a day.  Cholesterol: less than _________mg a day.  Fiber: ______g a day. What are tips for following this plan? Meal planning  At meals, divide your plate into four equal parts: ? Fill one-half of your plate with vegetables and green salads. ? Fill one-fourth of your plate with whole grains. ? Fill one-fourth of your plate with low-fat (lean) protein foods.  Eat fish that is high in omega-3 fats at least two times a week. This includes mackerel, tuna, sardines, and salmon.  Eat foods that are high in fiber, such as whole grains, beans, apples, broccoli, carrots, peas, and barley. General tips   Work with your doctor to lose weight if you need to.  Avoid: ? Foods with added sugar. ? Fried foods. ? Foods with partially hydrogenated oils.  Limit alcohol intake to no more than 1 drink a day for nonpregnant women and 2 drinks a day for men. One drink equals 12 oz of beer, 5 oz of wine, or 1 oz of hard liquor. Reading food labels  Check food labels for: ? Trans fats. ? Partially hydrogenated oils. ? Saturated fat (g) in each serving. ? Cholesterol (mg) in each serving. ? Fiber (g) in each serving.  Choose foods with healthy fats, such as: ? Monounsaturated fats. ? Polyunsaturated fats. ? Omega-3 fats.  Choose grain products that have whole grains. Look for the word "whole" as the first word in the ingredient list. Cooking  Cook foods using low-fat methods. These include baking, boiling, grilling, and broiling.  Eat more home-cooked foods. Eat at restaurants and buffets  less often.  Avoid cooking using saturated fats, such as butter, cream, palm oil, palm kernel oil, and coconut oil. Recommended foods  Fruits  All fresh, canned (in natural juice), or frozen fruits. Vegetables  Fresh or frozen vegetables (raw, steamed, roasted, or grilled). Green salads. Grains  Whole grains, such as whole wheat or whole grain breads, crackers, cereals, and pasta. Unsweetened oatmeal, bulgur, barley, quinoa, or brown rice. Corn or whole wheat flour tortillas. Meats and other protein foods  Ground beef (85% or leaner), grass-fed beef, or beef trimmed of fat. Skinless chicken or turkey. Ground chicken or turkey. Pork trimmed of fat. All fish and seafood. Egg whites. Dried beans, peas, or lentils. Unsalted nuts or seeds. Unsalted canned beans. Nut butters without added sugar or oil. Dairy  Low-fat or nonfat dairy products, such as skim or 1% milk, 2% or reduced-fat cheeses, low-fat and fat-free ricotta or cottage cheese, or plain low-fat and nonfat yogurt. Fats and oils  Tub margarine without trans fats. Light or reduced-fat mayonnaise and salad dressings. Avocado. Olive, canola, sesame, or safflower oils. The items listed above may not be a complete list of foods and beverages you can eat. Contact a dietitian for more information. Foods to avoid Fruits  Canned fruit in heavy syrup. Fruit in cream or butter sauce. Fried fruit. Vegetables  Vegetables cooked in cheese, cream, or butter sauce. Fried vegetables. Grains  White bread. White pasta. White rice. Cornbread. Bagels, pastries, and croissants. Crackers and snack foods that contain trans fat   and hydrogenated oils. Meats and other protein foods  Fatty cuts of meat. Ribs, chicken wings, bacon, sausage, bologna, salami, chitterlings, fatback, hot dogs, bratwurst, and packaged lunch meats. Liver and organ meats. Whole eggs and egg yolks. Chicken and turkey with skin. Fried meat. Dairy  Whole or 2% milk, cream,  half-and-half, and cream cheese. Whole milk cheeses. Whole-fat or sweetened yogurt. Full-fat cheeses. Nondairy creamers and whipped toppings. Processed cheese, cheese spreads, and cheese curds. Beverages  Alcohol. Sugar-sweetened drinks such as sodas, lemonade, and fruit drinks. Fats and oils  Butter, stick margarine, lard, shortening, ghee, or bacon fat. Coconut, palm kernel, and palm oils. Sweets and desserts  Corn syrup, sugars, honey, and molasses. Candy. Jam and jelly. Syrup. Sweetened cereals. Cookies, pies, cakes, donuts, muffins, and ice cream. The items listed above may not be a complete list of foods and beverages you should avoid. Contact a dietitian for more information. Summary  Choosing the right foods helps keep your fat and cholesterol at normal levels. This can keep you from getting certain diseases.  At meals, fill one-half of your plate with vegetables and green salads.  Eat high-fiber foods, like whole grains, beans, apples, carrots, peas, and barley.  Limit added sugar, saturated fats, alcohol, and fried foods. This information is not intended to replace advice given to you by your health care provider. Make sure you discuss any questions you have with your health care provider. Document Released: 06/28/2011 Document Revised: 08/30/2017 Document Reviewed: 09/13/2016 Elsevier Patient Education  2020 Elsevier Inc.  

## 2019-01-10 ENCOUNTER — Telehealth: Payer: Self-pay | Admitting: Pharmacist

## 2019-01-10 NOTE — Telephone Encounter (Signed)
01/10/2019 12:41:45 PM - Symbicort faxed to [place]  [phone] Faxed [place] application for Symbicort 160/4.5 mcg Inhale 2 puffs every 12 hours for enrollment.Delos Haring

## 2019-02-07 ENCOUNTER — Telehealth: Payer: Self-pay | Admitting: Pharmacy Technician

## 2019-02-07 NOTE — Telephone Encounter (Signed)
Received 2020 proof of income.  Patient eligible to receive medication assistance at [facility] until annual re-certification ends in 2022, and as long as eligibility requirements continue to be met.  [facility]

## 2019-02-11 DIAGNOSIS — U071 COVID-19: Secondary | ICD-10-CM

## 2019-02-11 HISTORY — DX: COVID-19: U07.1

## 2019-02-13 ENCOUNTER — Other Ambulatory Visit: Payer: Medicaid Other

## 2019-02-13 ENCOUNTER — Other Ambulatory Visit: Payer: Self-pay

## 2019-02-13 DIAGNOSIS — E785 Hyperlipidemia, unspecified: Secondary | ICD-10-CM

## 2019-02-14 LAB — LIPID PANEL
Chol/HDL Ratio: 4.5 ratio (ref 0.0–5.0)
Cholesterol, Total: 195 mg/dL (ref 100–199)
HDL: 43 mg/dL (ref >39)
LDL Chol Calc (NIH): 85 mg/dL (ref 0–99)
Triglycerides: 412 mg/dL — ABNORMAL HIGH (ref 0–149)
VLDL Cholesterol Cal: 67 mg/dL — ABNORMAL HIGH (ref 5–40)

## 2019-02-18 ENCOUNTER — Ambulatory Visit: Payer: Medicaid Other

## 2019-02-18 ENCOUNTER — Other Ambulatory Visit: Payer: Self-pay

## 2019-02-18 DIAGNOSIS — Z79899 Other long term (current) drug therapy: Secondary | ICD-10-CM

## 2019-02-18 NOTE — Progress Notes (Signed)
[facility] Visit Note  Patient: Jason Wiggins. MRN: [id number] Date of Birth: 1955/03/09 PCP: [facility]   Jason Wiggins. 64 y.o. male presents for a telephone medication therapy management visit with the pharmacist today. Patient identified using two patient identifiers.   There were no vitals taken for this visit.  Patient Information   Past Medical History:  Diagnosis Date  . Alcohol abuse    possibly as much as 24 beers per day  . Arthritis   . HTN (hypertension)       Past Surgical History:  Procedure Laterality Date  . HERNIA REPAIR     Right inguinal  . INTRAVASCULAR PRESSURE WIRE/FFR STUDY N/A 10/28/2016   Procedure: INTRAVASCULAR PRESSURE WIRE/FFR STUDY;  Surgeon: Leonie Man, MD;  Location: [facility];  Service: Cardiovascular;  Laterality: N/A;  . LEFT HEART CATH AND CORONARY ANGIOGRAPHY N/A 10/28/2016   Procedure: LEFT HEART CATH AND CORONARY ANGIOGRAPHY;  Surgeon: Leonie Man, MD;  Location: [facility];  Service: Cardiovascular;  Laterality: N/A;     Family History  Problem Relation Age of Onset  . Cancer Mother   . Cancer Father     New Diagnoses (since last visit): n/a  Family Support: Good    Social History   Substance and Sexual Activity  Alcohol Use Yes   Comment: daily, 4 - 5 beers per day   Reports drinking less frequently now due to financial constraints   Social History   Tobacco Use  Smoking Status Never Smoker  Smokeless Tobacco Never Used   Denies tobacco and illicit drug use   Health Maintenance  Topic Date Due  . Hepatitis C Screening  1955-06-24  . TETANUS/TDAP  06/13/1975  . INFLUENZA VACCINE  10/16/2019 (Originally 08/11/2018)  . COLONOSCOPY  10/16/2019 (Originally 06/13/2006)  . HIV Screening  Completed   Outpatient Encounter Medications as of 02/18/2019  Medication Sig  . albuterol (VENTOLIN HFA) 108 (90 Base) MCG/ACT inhaler Inhale 2 puffs into the  lungs every 6 (six) hours as needed for wheezing or shortness of breath.  Marland Kitchen aspirin EC 81 MG tablet Take 1 tablet (81 mg total) by mouth daily.  . nitroGLYCERIN (NITROSTAT) 0.4 MG SL tablet Place 1 tablet (0.4 mg total) under the tongue every 5 (five) minutes as needed for chest pain.  . pravastatin (PRAVACHOL) 20 MG tablet Take 1 tablet (20 mg total) by mouth daily.  . budesonide-formoterol (SYMBICORT) 160-4.5 MCG/ACT inhaler Inhale 2 puffs into the lungs every 12 (twelve) hours.  . budesonide-formoterol (SYMBICORT) 160-4.5 MCG/ACT inhaler Inhale 2 puffs into the lungs every 12 (twelve) hours.  . fluticasone (FLONASE) 50 MCG/ACT nasal spray Place 1 spray into both nostrils daily. (Patient not taking: Reported on 02/18/2019)  . Multiple Vitamin (MULTIVITAMIN WITH MINERALS) TABS tablet Take 1 tablet by mouth daily. (Patient not taking: Reported on 02/18/2019)  . [DISCONTINUED] benzonatate (TESSALON PERLES) 100 MG capsule Take 1 capsule (100 mg total) by mouth 3 (three) times daily as needed for cough.  . [DISCONTINUED] triamcinolone cream (KENALOG) 0.1 % Apply 1 application topically 2 (two) times daily.   No facility-administered encounter medications on file as of 02/18/2019.    Health Maintenance/Date Completed  Last ED visit: 08/08/2018 (shortness of breath and chest pain) Last Visit to PCP: 01/08/2019 [facility]) Next Visit to PCP: 02/20/2019 [facility]) Specialist Visit: 12/12/2019 (Pulmonology). Patient also reports he has a cardiologist.  Dental Exam: Unk. Eye Exam: Unk. Prostate Exam: Never Colonoscopy:  Never Flu Vaccine: Reports last was > 10 yr ago and he was sick for a month after Pneumonia Vaccine: Never COVID-19 Vaccine: Never Shingrix Vaccine: Never  Assessment and Plan:  1. Shortness of breath -Follows with pulmonologist (Dr. Jayme Cloud) -Respiratory medications include budesonide-formoterol (Symbicort) 2 puffs twice daily and albuterol HFA PRN -Patient endorses washing his mouth out after  using Symbicort  2. Essential hypertension -Not taking any antihypertensive medications at this time. Stopped taking chlorthalidone due to side effect of migraines -Reports that his sister will check his blood pressure once daily at home. Unable to recall recent blood pressure measurements. Advised patient to keep a log of blood pressure readings to share with his provider  3. Hyperlipidemia -Pravastatin 20 mg daily -Last lipid panel 02/13/2019 with LDL 85, HDL 43, TG 412  4. Coronary artery disease/angina -Aspirin 81 mg daily, nitroglycerin SL PRN chest pain -Reports he last took SL nitroglycerin yesterday for chest pain with resolution of symptoms -Reports that he is aware of parameters for when to seek medical attention in the event of un-resolved chest pain etc.  5. Health maintenance -Due for colonoscopy and prostate exam -Due for annual influenza vaccine, 1 dose PPSV23, COVID-19 vaccine, and Shingrix series  6. Adherence -Reports adherence to medications as prescribed -Requesting refills on Flonase and multivitamin  RTC 1 year  Dorothea Ogle Pharmacy Resident 18 February 2019

## 2019-02-19 ENCOUNTER — Other Ambulatory Visit: Payer: Self-pay | Admitting: Gerontology

## 2019-02-19 DIAGNOSIS — J3489 Other specified disorders of nose and nasal sinuses: Secondary | ICD-10-CM

## 2019-02-20 ENCOUNTER — Ambulatory Visit: Payer: Medicaid Other | Admitting: Gerontology

## 2019-02-20 ENCOUNTER — Other Ambulatory Visit: Payer: Self-pay

## 2019-02-20 ENCOUNTER — Ambulatory Visit: Payer: Medicaid Other | Attending: Internal Medicine

## 2019-02-20 DIAGNOSIS — Z20822 Contact with and (suspected) exposure to covid-19: Secondary | ICD-10-CM

## 2019-02-21 LAB — NOVEL CORONAVIRUS, NAA: SARS-CoV-2, NAA: DETECTED — AB

## 2019-02-22 ENCOUNTER — Other Ambulatory Visit: Payer: Self-pay | Admitting: Unknown Physician Specialty

## 2019-02-22 ENCOUNTER — Ambulatory Visit (HOSPITAL_COMMUNITY)
Admission: RE | Admit: 2019-02-22 | Discharge: 2019-02-22 | Disposition: A | Discharge status: Home / Self Care | Payer: Medicaid Other | Source: Ambulatory Visit | Attending: Pulmonary Disease | Admitting: Pulmonary Disease

## 2019-02-22 ENCOUNTER — Telehealth: Payer: Self-pay | Admitting: Unknown Physician Specialty

## 2019-02-22 DIAGNOSIS — U071 COVID-19: Secondary | ICD-10-CM

## 2019-02-22 DIAGNOSIS — Z23 Encounter for immunization: Secondary | ICD-10-CM | POA: Diagnosis not present

## 2019-02-22 DIAGNOSIS — Z8679 Personal history of other diseases of the circulatory system: Secondary | ICD-10-CM

## 2019-02-22 MED ORDER — FAMOTIDINE IN NACL 20-0.9 MG/50ML-% IV SOLN: 20.0000 mg | Freq: Once | INTRAVENOUS | Status: DC | PRN

## 2019-02-22 MED ORDER — EPINEPHRINE 0.3 MG/0.3ML IJ SOAJ: 0.3000 mg | Freq: Once | INTRAMUSCULAR | Status: DC | PRN

## 2019-02-22 MED ORDER — DIPHENHYDRAMINE HCL 50 MG/ML IJ SOLN: 50.0000 mg | Freq: Once | INTRAMUSCULAR | Status: DC | PRN

## 2019-02-22 MED ORDER — SODIUM CHLORIDE 0.9 % IV SOLN: INTRAVENOUS | Status: DC | PRN

## 2019-02-22 MED ORDER — ALBUTEROL SULFATE HFA 108 (90 BASE) MCG/ACT IN AERS: 2.0000 | INHALATION_SPRAY | Freq: Once | RESPIRATORY_TRACT | Status: DC | PRN

## 2019-02-22 MED ORDER — SODIUM CHLORIDE 0.9 % IV SOLN
Freq: Once | INTRAVENOUS | Status: AC
  Filled 2019-02-22: qty 10

## 2019-02-22 MED ORDER — METHYLPREDNISOLONE SODIUM SUCC 125 MG IJ SOLR: 125.0000 mg | Freq: Once | INTRAMUSCULAR | Status: DC | PRN

## 2019-02-22 NOTE — Telephone Encounter (Signed)
Correction mor mab infusion - symptom onset February 7th

## 2019-02-22 NOTE — Progress Notes (Addendum)
  Diagnosis: COVID-19  Physician: Dr. Delford Field  Procedure: [facility]fusion [facility] Med: casirivimab\imdevimab infusion - Provided patient with casirivimab\imdevimab fact sheet for patients, parents and caregivers prior to infusion.  Complications: No immediate complications noted.  Discharge: Discharged home   Jason Wiggins 02/22/2019

## 2019-02-22 NOTE — Discharge Instructions (Signed)

## 2019-02-22 NOTE — Progress Notes (Signed)
Informed Provider, Angus Seller, NP, pt c/o chest pain when coughing.  Pt denies pain when not coughing.  VSS.  BP 112/83, PR 105, O2 on RA 99%.  NAD.  Provider stated OK to administer infusion.  Will continue to monitor.

## 2019-02-22 NOTE — Telephone Encounter (Signed)
Discussed with patient about Covid symptoms and the use of Regeneron, a monoclonal antibody infusion for those with mild to moderate Covid symptoms and at a high risk of hospitalization.  Pt is qualified for this infusion at the [facility] infusion center due to Age >55, Hypertension and BMI>35.    He is interested.  Directions given to patient and his sister.  Sister has not yet been tested but she would qualify and has symptoms.      Sx onset 10/3

## 2019-02-22 NOTE — Progress Notes (Signed)
  I connected by phone with Jason Wiggins. on 02/22/2019 at 10:17 AM to discuss the potential use of an new treatment for mild to moderate COVID-19 viral infection in non-hospitalized patients.  This patient is a 64 y.o. male that meets the FDA criteria for Emergency Use Authorization of bamlanivimab or casirivimab\imdevimab.  Has a (+) direct SARS-CoV-2 viral test result  Has mild or moderate COVID-19   Is ? 63 years of age and weighs ? 40 kg  Is NOT hospitalized due to COVID-19  Is NOT requiring oxygen therapy or requiring an increase in baseline oxygen flow rate due to COVID-19  Is within 10 days of symptom onset  Has at least one of the high risk factor(s) for progression to severe COVID-19 and/or hospitalization as defined in EUA.  Specific high risk criteria : BMI >/= 35 plus over 55 with hypertension   I have spoken and communicated the following to the patient or parent/caregiver:  1. FDA has authorized the emergency use of bamlanivimab and casirivimab\imdevimab for the treatment of mild to moderate COVID-19 in adults and pediatric patients with positive results of direct SARS-CoV-2 viral testing who are 35 years of age and older weighing at least 40 kg, and who are at high risk for progressing to severe COVID-19 and/or hospitalization.  2. The significant known and potential risks and benefits of bamlanivimab and casirivimab\imdevimab, and the extent to which such potential risks and benefits are unknown.  3. Information on available alternative treatments and the risks and benefits of those alternatives, including clinical trials.  4. Patients treated with bamlanivimab and casirivimab\imdevimab should continue to self-isolate and use infection control measures (e.g., wear mask, isolate, social distance, avoid sharing personal items, clean and disinfect "high touch" surfaces, and frequent handwashing) according to CDC guidelines.   5. The patient or parent/caregiver has the  option to accept or refuse bamlanivimab or casirivimab\imdevimab .  After reviewing this information with the patient, The patient agreed to proceed with receiving the casirivimab\imdevimab infusion and will be provided a copy of the Fact sheet prior to receiving the infusion.[name] 02/22/2019 10:17 AM

## 2019-04-11 ENCOUNTER — Ambulatory Visit: Payer: Medicaid Other | Admitting: Urology

## 2019-04-11 VITALS — BP 145/92 | HR 102 | Temp 97.2°F | Ht 69.0 in | Wt 262.0 lb

## 2019-04-11 DIAGNOSIS — R0602 Shortness of breath: Secondary | ICD-10-CM

## 2019-04-11 DIAGNOSIS — U071 COVID-19: Secondary | ICD-10-CM

## 2019-04-11 DIAGNOSIS — J40 Bronchitis, not specified as acute or chronic: Secondary | ICD-10-CM

## 2019-04-11 DIAGNOSIS — J3489 Other specified disorders of nose and nasal sinuses: Secondary | ICD-10-CM

## 2019-04-11 DIAGNOSIS — E785 Hyperlipidemia, unspecified: Secondary | ICD-10-CM

## 2019-04-11 MED ORDER — NITROGLYCERIN 0.4 MG SL SUBL: 0.4000 mg | SUBLINGUAL_TABLET | SUBLINGUAL | 1 refills | Status: DC | PRN

## 2019-04-11 MED ORDER — BENZONATATE 100 MG PO CAPS: 200.0000 mg | ORAL_CAPSULE | Freq: Three times a day (TID) | ORAL | 0 refills | Status: DC | PRN

## 2019-04-11 MED ORDER — ALBUTEROL SULFATE HFA 108 (90 BASE) MCG/ACT IN AERS: 2.0000 | INHALATION_SPRAY | Freq: Four times a day (QID) | RESPIRATORY_TRACT | 2 refills | Status: DC | PRN

## 2019-04-11 MED ORDER — FLUTICASONE PROPIONATE 50 MCG/ACT NA SUSP: NASAL | 0 refills | Status: DC

## 2019-04-11 MED ORDER — BUDESONIDE-FORMOTEROL FUMARATE 160-4.5 MCG/ACT IN AERO: 2.0000 | INHALATION_SPRAY | Freq: Two times a day (BID) | RESPIRATORY_TRACT | 5 refills | Status: DC

## 2019-04-11 MED ORDER — PRAVASTATIN SODIUM 20 MG PO TABS: 20.0000 mg | ORAL_TABLET | Freq: Every day | ORAL | 2 refills | Status: DC

## 2019-04-11 NOTE — Progress Notes (Signed)
  Patient: Jason Wiggins. Male    DOB: Feb 21, 1955   64 y.o.   MRN: [id number] Visit Date: 04/11/2019  Today's Provider: [facility]   Chief Complaint  Patient presents with  . Medication Refill   Subjective:    HPI  Recovering from COVID.  Has not seen pulmonologist recently.  Was not able to complete sleep study due to lack of insurance.    Meds refilled today.    No Known Allergies Previous Medications   ALBUTEROL (VENTOLIN HFA) 108 (90 BASE) MCG/ACT INHALER    Inhale 2 puffs into the lungs every 6 (six) hours as needed for wheezing or shortness of breath.   ASPIRIN EC 81 MG TABLET    Take 1 tablet (81 mg total) by mouth daily.   BUDESONIDE-FORMOTEROL (SYMBICORT) 160-4.5 MCG/ACT INHALER    Inhale 2 puffs into the lungs every 12 (twelve) hours.   BUDESONIDE-FORMOTEROL (SYMBICORT) 160-4.5 MCG/ACT INHALER    Inhale 2 puffs into the lungs every 12 (twelve) hours.   FLUTICASONE (FLONASE) 50 MCG/ACT NASAL SPRAY    PLACE ONE SPRAY INTO BOTH NOSTRILS EVERY DAY   MULTIPLE VITAMIN (MULTIVITAMIN WITH MINERALS) TABS TABLET    Take 1 tablet by mouth daily.   NITROGLYCERIN (NITROSTAT) 0.4 MG SL TABLET    Place 1 tablet (0.4 mg total) under the tongue every 5 (five) minutes as needed for chest pain.   PRAVASTATIN (PRAVACHOL) 20 MG TABLET    Take 1 tablet (20 mg total) by mouth daily.    Review of Systems  Social History   Tobacco Use  . Smoking status: Never Smoker  . Smokeless tobacco: Never Used  Substance Use Topics  . Alcohol use: Yes    Comment: daily, 4 - 5 beers per day   Objective:   BP (!) 145/92   Pulse (!) 102   Temp (!) 97.2 F (36.2 C)   Ht 5\' 9"  (1.753 m)   Wt 262 lb (118.8 kg)   SpO2 94%   BMI 38.69 kg/m   Physical Exam      Assessment & Plan:      1. Shortness of breath - albuterol (VENTOLIN HFA) 108 (90 Base) MCG/ACT inhaler; Inhale 2 puffs into the lungs every 6 (six) hours as needed for wheezing or shortness of breath.  Dispense: 6.7 g;  Refill: 2 - DG Chest 2 View; Future - Ambulatory referral to Sleep Studies - Ambulatory referral to Pulmonology  2. Hyperlipidemia, unspecified hyperlipidemia type - pravastatin (PRAVACHOL) 20 MG tablet; Take 1 tablet (20 mg total) by mouth daily.  Dispense: 30 tablet; Refill: 2  3. Elevated lipids - pravastatin (PRAVACHOL) 20 MG tablet; Take 1 tablet (20 mg total) by mouth daily.  Dispense: 30 tablet; Refill: 2  4. Bronchitis due to COVID-19 virus - DG Chest 2 View; Future - Ambulatory referral to Sleep Studies - Ambulatory referral to Pulmonology  5. Cough/Sore Throat Script given for Flonase and    [facility]   [facility]

## 2019-04-19 ENCOUNTER — Telehealth: Payer: Self-pay | Admitting: Pharmacist

## 2019-04-19 ENCOUNTER — Ambulatory Visit
Admission: RE | Admit: 2019-04-19 | Discharge: 2019-04-19 | Disposition: A | Discharge status: Home / Self Care | Payer: HRSA Program | Source: Ambulatory Visit | Attending: Urology | Admitting: Urology

## 2019-04-19 ENCOUNTER — Ambulatory Visit
Admission: RE | Admit: 2019-04-19 | Discharge: 2019-04-19 | Disposition: A | Discharge status: Home / Self Care | Payer: HRSA Program | Attending: Urology | Admitting: Urology

## 2019-04-19 ENCOUNTER — Other Ambulatory Visit: Payer: Self-pay

## 2019-04-19 DIAGNOSIS — R0602 Shortness of breath: Secondary | ICD-10-CM | POA: Insufficient documentation

## 2019-04-19 DIAGNOSIS — U071 COVID-19: Secondary | ICD-10-CM | POA: Diagnosis not present

## 2019-04-19 DIAGNOSIS — J40 Bronchitis, not specified as acute or chronic: Secondary | ICD-10-CM | POA: Insufficient documentation

## 2019-04-19 NOTE — Telephone Encounter (Signed)
04/19/2019 11:44:00 AM - Combivent 20/100 refill  -- Rhetta Mura - Friday, April 19, 2019 11:42 AM --Jeanene Erb Boehringer spoke with Minerva Areola to refill Combivent 20/100--allow 5-7 business days for [place] to receive at our office--patient has 1 refill left. Enrollment ends 10/06/2019.

## 2019-04-27 ENCOUNTER — Ambulatory Visit: Payer: Medicaid Other | Attending: Internal Medicine

## 2019-04-27 DIAGNOSIS — Z23 Encounter for immunization: Secondary | ICD-10-CM

## 2019-04-27 NOTE — Progress Notes (Signed)
   [id number]  Name:  Jason Wiggins.    MRN: [id number] DOB: 10-03-1955  04/27/2019  [name] was observed post Covid-19 immunization for 30 minutes based on pre-vaccination screening without incident. He was provided with Vaccine Information Sheet and instruction to access the V-Safe system.   [name] was instructed to call 911 with any severe reactions post vaccine: Marland Kitchen Difficulty breathing  . Swelling of face and throat  . A fast heartbeat  . A bad rash all over body  . Dizziness and weakness   Immunizations Administered    Name Date Dose VIS Date Route   Pfizer COVID-19 Vaccine 04/27/2019  1:05 PM 0.3 mL 12/21/2018 Intramuscular   Manufacturer: [employer], [employer]   Lot: [id number]   NDC: [address]-8

## 2019-04-29 ENCOUNTER — Ambulatory Visit: Payer: Medicaid Other

## 2019-04-29 ENCOUNTER — Telehealth: Payer: Self-pay

## 2019-04-29 NOTE — Telephone Encounter (Signed)
On Thursday 04/25/19, I spoke with Malen Gauze and his sister regarding Carollee Herter McGowan's request for referral for sleep study.   Per visit note with Pulmonologist in Dec 2020, patient was to only be using oxygen at night, no longer needed during the day. Patient stated on 04/25/19 that he is using during the day. He has not currently scheduled a 3 month followup appt with Pulmonlogist per Dec 2020 visit note.  Per Pulmonologist note, we can send to [facility] sleep center for night time pulse oximetry study to see if patient still needs during the day.   [facility] financial application was mailed to patient on 04/25/19. Both patient and sister understand they need to return this application to [facility].   They understand they need to schedule followup with Pulmonologist and phone number was given to sister.  He has followup with [facility] 05/09/19.

## 2019-05-09 ENCOUNTER — Other Ambulatory Visit: Payer: Self-pay

## 2019-05-09 ENCOUNTER — Encounter: Payer: Self-pay | Admitting: Gerontology

## 2019-05-09 ENCOUNTER — Ambulatory Visit: Payer: Medicaid Other | Admitting: Gerontology

## 2019-05-09 VITALS — BP 133/84 | HR 92 | Ht 69.0 in | Wt 266.0 lb

## 2019-05-09 DIAGNOSIS — R059 Cough, unspecified: Secondary | ICD-10-CM

## 2019-05-09 DIAGNOSIS — R0602 Shortness of breath: Secondary | ICD-10-CM

## 2019-05-09 DIAGNOSIS — R05 Cough: Secondary | ICD-10-CM

## 2019-05-09 MED ORDER — BENZONATATE 100 MG PO CAPS: 200.0000 mg | ORAL_CAPSULE | Freq: Three times a day (TID) | ORAL | 0 refills | Status: DC | PRN

## 2019-05-09 NOTE — Progress Notes (Signed)
Established Patient Office Visit  Subjective:  Patient ID: Jason Wiggins., male    DOB: 04-08-1955  Age: 64 y.o. MRN: [id number]  CC:  Chief Complaint  Patient presents with  . Shortness of Breath    HPI Kit Mollett. presents for follow up visit. He had Covid infection in February of 2021, was seen at the clinic on 04/11/2019 and treated for shortness of breath with albuterol and Symbicort. Chest X ray was done on 04/19/2019 and it showed no active cardiopulmonary disease. Stable linear opacities compatible with parenchymal scarring per Dr Gabriel Earing. Currently, he states that he uses 2L of oxygen constantly at home. He reports getting intermittent headache and using oxygen relieves symptoms. He states that he continues to have intermittent productive cough with minimal yellowish or clear phelgm. He states that he has not had the sleep study done, but will follow up with Pulmonologist Dr Jayme Cloud on 05/27/2019. Overall, he states that he's doing well and offers no further complaint.  Past Medical History:  Diagnosis Date  . Alcohol abuse    possibly as much as 24 beers per day  . Arthritis   . HTN (hypertension)     Past Surgical History:  Procedure Laterality Date  . HERNIA REPAIR     Right inguinal  . INTRAVASCULAR PRESSURE WIRE/FFR STUDY N/A 10/28/2016   Procedure: INTRAVASCULAR PRESSURE WIRE/FFR STUDY;  Surgeon: Marykay Lex, MD;  Location: [facility] INVASIVE CV LAB;  Service: Cardiovascular;  Laterality: N/A;  . LEFT HEART CATH AND CORONARY ANGIOGRAPHY N/A 10/28/2016   Procedure: LEFT HEART CATH AND CORONARY ANGIOGRAPHY;  Surgeon: Marykay Lex, MD;  Location: [facility] INVASIVE CV LAB;  Service: Cardiovascular;  Laterality: N/A;    Family History  Problem Relation Age of Onset  . Cancer Mother   . Cancer Father     Social History   Socioeconomic History  . Marital status: Legally Separated    Spouse name: Not on file  . Number of children: 2  . Years of education: Not on  file  . Highest education level: Not on file  Occupational History  . Not on file  Tobacco Use  . Smoking status: Never Smoker  . Smokeless tobacco: Never Used  Substance and Sexual Activity  . Alcohol use: Yes    Comment: daily, 4 - 5 beers per day  . Drug use: No  . Sexual activity: Yes    Birth control/protection: Condom  Other Topics Concern  . Not on file  Social History Narrative   Lives with sister.  Lost his job.     Social Determinants of Health   Financial Resource Strain:   . Difficulty of Paying Living Expenses:   Food Insecurity: Food Insecurity Present  . Worried About Programme researcher, broadcasting/film/video in the Last Year: Often true  . Ran Out of Food in the Last Year: Often true  Transportation Needs: No Transportation Needs  . Lack of Transportation (Medical): No  . Lack of Transportation (Non-Medical): No  Physical Activity: Inactive  . Days of Exercise per Week: 0 days  . Minutes of Exercise per Session: 0 min  Stress: Stress Concern Present  . Feeling of Stress : Very much  Social Connections:   . Frequency of Communication with Friends and Family:   . Frequency of Social Gatherings with Friends and Family:   . Attends Religious Services:   . Active Member of Clubs or Organizations:   . Attends Banker Meetings:   .  Marital Status:   Intimate Partner Violence: Not At Risk  . Fear of Current or Ex-Partner: No  . Emotionally Abused: No  . Physically Abused: No  . Sexually Abused: No    Outpatient Medications Prior to Visit  Medication Sig Dispense Refill  . albuterol (VENTOLIN HFA) 108 (90 Base) MCG/ACT inhaler Inhale 2 puffs into the lungs every 6 (six) hours as needed for wheezing or shortness of breath. 6.7 g 2  . aspirin EC 81 MG tablet Take 1 tablet (81 mg total) by mouth daily. 30 tablet 2  . budesonide-formoterol (SYMBICORT) 160-4.5 MCG/ACT inhaler Inhale 2 puffs into the lungs every 12 (twelve) hours. 1 Inhaler 5  . fluticasone (FLONASE) 50  MCG/ACT nasal spray PLACE ONE SPRAY INTO BOTH NOSTRILS EVERY DAY 16 g 0  . nitroGLYCERIN (NITROSTAT) 0.4 MG SL tablet Place 1 tablet (0.4 mg total) under the tongue every 5 (five) minutes as needed for chest pain. 30 tablet 1  . pravastatin (PRAVACHOL) 20 MG tablet Take 1 tablet (20 mg total) by mouth daily. 30 tablet 2  . benzonatate (TESSALON PERLES) 100 MG capsule Take 2 capsules (200 mg total) by mouth 3 (three) times daily as needed for cough. (Patient not taking: Reported on 05/09/2019) 20 capsule 0  . budesonide-formoterol (SYMBICORT) 160-4.5 MCG/ACT inhaler Inhale 2 puffs into the lungs every 12 (twelve) hours. 1 Inhaler 0  . Multiple Vitamin (MULTIVITAMIN WITH MINERALS) TABS tablet Take 1 tablet by mouth daily. (Patient not taking: Reported on 02/18/2019) 30 tablet 2   No facility-administered medications prior to visit.    No Known Allergies  ROS Review of Systems  Constitutional: Negative.   HENT: Negative.   Respiratory: Positive for cough and shortness of breath.   Cardiovascular: Negative.   Neurological: Negative.   Psychiatric/Behavioral: Negative.       Objective:    Physical Exam  Constitutional: He is oriented to person, place, and time. He appears well-developed.  HENT:  Head: Normocephalic and atraumatic.  Cardiovascular: Normal rate and regular rhythm.  Pulmonary/Chest: Effort normal and breath sounds normal.  Neurological: He is alert and oriented to person, place, and time.  Psychiatric: He has a normal mood and affect. His behavior is normal. Judgment and thought content normal.    BP 133/84 (BP Location: Right Arm, Patient Position: Sitting)   Pulse 92   Ht 5\' 9"  (1.753 m)   Wt 266 lb (120.7 kg)   SpO2 96%   BMI 39.28 kg/m  Wt Readings from Last 3 Encounters:  05/09/19 266 lb (120.7 kg)  04/11/19 262 lb (118.8 kg)  01/08/19 269 lb (122 kg)   He was advised to continue on a weight loss regimen.  Health Maintenance Due  Topic Date Due  .  Hepatitis C Screening  Never done  . TETANUS/TDAP  Never done    There are no preventive care reminders to display for this patient.  Lab Results  Component Value Date   TSH 1.733 04/26/2018   Lab Results  Component Value Date   WBC 4.9 08/09/2018   HGB 13.7 08/09/2018   HCT 39.5 08/09/2018   MCV 93.4 08/09/2018   PLT 180 08/09/2018   Lab Results  Component Value Date   NA 142 09/19/2018   K 4.1 09/19/2018   CO2 25 09/19/2018   GLUCOSE 95 09/19/2018   BUN 15 09/19/2018   CREATININE 0.92 09/19/2018   BILITOT 0.4 09/19/2018   ALKPHOS 73 09/19/2018   AST 39 09/19/2018   ALT  55 (H) 09/19/2018   PROT 6.3 09/19/2018   ALBUMIN 4.3 09/19/2018   CALCIUM 9.3 09/19/2018   ANIONGAP 12 08/09/2018   Lab Results  Component Value Date   CHOL 195 02/13/2019   Lab Results  Component Value Date   HDL 43 02/13/2019   Lab Results  Component Value Date   LDLCALC 85 02/13/2019   Lab Results  Component Value Date   TRIG 412 (H) 02/13/2019   Lab Results  Component Value Date   CHOLHDL 4.5 02/13/2019   Lab Results  Component Value Date   HGBA1C 5.4 08/22/2018      Assessment & Plan:   1. Shortness of breath - He was advised to continue using Albuterol as needed and to follow up with Pulmonologist Dr Jayme Cloud on 05/27/2019. - He was provided with Pulse Oximetry and advised to check and record his oxygen saturation.  2. Cough - He will continue on Benzonatate, educated on medication side effects and advised to notify clinic. - benzonatate (TESSALON PERLES) 100 MG capsule; Take 2 capsules (200 mg total) by mouth 3 (three) times daily as needed for cough.  Dispense: 20 capsule; Refill: 0     Follow-up: Return in about 5 weeks (around 06/13/2019), or if symptoms worsen or fail to improve.    Bao Coreas Trellis Paganini, NP

## 2019-05-22 ENCOUNTER — Ambulatory Visit: Payer: Medicaid Other | Attending: Internal Medicine

## 2019-05-22 DIAGNOSIS — Z23 Encounter for immunization: Secondary | ICD-10-CM

## 2019-05-22 NOTE — Progress Notes (Signed)
   [id number]  Name:  Jason Wiggins.    MRN: [id number] DOB: 29-Jul-1955  05/22/2019  Mr. Jason Wiggins was observed post Covid-19 immunization for 15 minutes without incident. He was provided with Vaccine Information Sheet and instruction to access the V-Safe system.   Mr. Jason Wiggins was instructed to call 911 with any severe reactions post vaccine: Marland Kitchen Difficulty breathing  . Swelling of face and throat  . A fast heartbeat  . A bad rash all over body  . Dizziness and weakness   Immunizations Administered    Name Date Dose VIS Date Route   Pfizer COVID-19 Vaccine 05/22/2019  8:19 AM 0.3 mL 03/06/2018 Intramuscular   Manufacturer: [employer], [employer]   Lot: [id number]   NDC: [address]-8

## 2019-05-24 ENCOUNTER — Other Ambulatory Visit: Payer: Self-pay

## 2019-05-24 ENCOUNTER — Emergency Department: Payer: Medicaid Other

## 2019-05-24 ENCOUNTER — Emergency Department
Admission: EM | Admit: 2019-05-24 | Discharge: 2019-05-24 | Disposition: A | Discharge status: Home / Self Care | Payer: Medicaid Other | Attending: Emergency Medicine | Admitting: Emergency Medicine

## 2019-05-24 DIAGNOSIS — I10 Essential (primary) hypertension: Secondary | ICD-10-CM | POA: Insufficient documentation

## 2019-05-24 DIAGNOSIS — Z79899 Other long term (current) drug therapy: Secondary | ICD-10-CM | POA: Insufficient documentation

## 2019-05-24 DIAGNOSIS — R0789 Other chest pain: Secondary | ICD-10-CM

## 2019-05-24 DIAGNOSIS — Z7982 Long term (current) use of aspirin: Secondary | ICD-10-CM | POA: Insufficient documentation

## 2019-05-24 LAB — TROPONIN I (HIGH SENSITIVITY)
Troponin I (High Sensitivity): 13 ng/L (ref <18)
Troponin I (High Sensitivity): 12 ng/L (ref <18)

## 2019-05-24 LAB — CBC
HCT: 39.3 % (ref 39.0–52.0)
Hemoglobin: 14.3 g/dL (ref 13.0–17.0)
MCH: 34.4 pg — ABNORMAL HIGH (ref 26.0–34.0)
MCHC: 36.4 g/dL — ABNORMAL HIGH (ref 30.0–36.0)
MCV: 94.5 fL (ref 80.0–100.0)
Platelets: 159 10*3/uL (ref 150–400)
RBC: 4.16 MIL/uL — ABNORMAL LOW (ref 4.22–5.81)
RDW: 13.9 % (ref 11.5–15.5)
WBC: 4.3 10*3/uL (ref 4.0–10.5)
nRBC: 0 % (ref 0.0–0.2)

## 2019-05-24 LAB — BASIC METABOLIC PANEL
Anion gap: 9 (ref 5–15)
BUN: 16 mg/dL (ref 8–23)
CO2: 26 mmol/L (ref 22–32)
Calcium: 9.5 mg/dL (ref 8.9–10.3)
Chloride: 108 mmol/L (ref 98–111)
Creatinine, Ser: 1.14 mg/dL (ref 0.61–1.24)
GFR calc Af Amer: >60 mL/min (ref >60)
GFR calc non Af Amer: >60 mL/min (ref >60)
Glucose, Bld: 141 mg/dL — ABNORMAL HIGH (ref 70–99)
Potassium: 3.8 mmol/L (ref 3.5–5.1)
Sodium: 143 mmol/L (ref 135–145)

## 2019-05-24 MED ORDER — NITROGLYCERIN 0.4 MG SL SUBL
0.4000 mg | SUBLINGUAL_TABLET | SUBLINGUAL | Status: DC | PRN
  Administered 2019-05-24: 0.4 mg via SUBLINGUAL
  Filled 2019-05-24: qty 1

## 2019-05-24 NOTE — ED Provider Notes (Signed)
[facility] Emergency Department Provider Note ____________________________________________   First MD Initiated Contact with Patient 05/24/19 1509     (approximate)  I have reviewed the triage vital signs and the nursing notes.   HISTORY  Chief Complaint Chest Pain    HPI Jason Wiggins. is a 64 y.o. 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  It has now persisted over the last several hours when he has been at rest.  He denies significant associated shortness of breath, lightheadedness, or nausea.  The patient states that he has had similar chest pain previously and it typically is relieved by nitroglycerin, but this time it did not respond to taking several nitroglycerin tablets.  Past Medical History:  Diagnosis Date  . Alcohol abuse    possibly as much as 24 beers per day  . Arthritis   . HTN (hypertension)     Patient Active Problem List   Diagnosis Date Noted  . Common cold 12/18/2018  . Cough 12/18/2018  . Essential hypertension 10/16/2018  . Contact dermatitis due to poison ivy 09/18/2018  . Chest pain on exertion 09/18/2018  . Edema of both lower extremities 08/22/2018  . Pleuritic chest pain 08/21/2018  . Encounter to establish care 08/21/2018  . History of coronary artery disease 08/21/2018  . Shortness of breath 08/21/2018  . Morbid obesity (HCC) 08/21/2018  . Sepsis (HCC) 04/26/2018  . Suspected COVID-19 virus infection 04/21/2018  . Hypoxia 03/21/2018  . Bleach ingestion 08/14/2017  . 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 Substance induced mood disorder (HCC) 08/14/2017  . Alcohol abuse 08/14/2017  . Unstable angina (HCC) 10/22/2016  . Hyperlipidemia  10/22/2016  . Medication management 10/22/2016    Past Surgical History:  Procedure Laterality Date  . HERNIA REPAIR     Right inguinal  . INTRAVASCULAR PRESSURE WIRE/FFR STUDY N/A 10/28/2016   Procedure: INTRAVASCULAR PRESSURE WIRE/FFR STUDY;  Surgeon: Marykay Lex, MD;  Location: [facility] INVASIVE CV LAB;  Service: Cardiovascular;  Laterality: N/A;  . LEFT HEART CATH AND CORONARY ANGIOGRAPHY N/A 10/28/2016   Procedure: LEFT HEART CATH AND CORONARY ANGIOGRAPHY;  Surgeon: Marykay Lex, MD;  Location: [facility] INVASIVE CV LAB;  Service: Cardiovascular;  Laterality: N/A;    Prior to Admission medications   Medication Sig Start Date End Date Taking? Authorizing Provider  albuterol (VENTOLIN HFA) 108 (90 Base) MCG/ACT inhaler Inhale 2 puffs into the lungs every 6 (six) hours as needed for wheezing or shortness of breath. 04/11/19   McGowan, Wellington Hampshire, PA-C  aspirin EC 81 MG tablet Take 1 tablet (81 mg total) by mouth daily. 08/21/18 08/21/19  Iloabachie, Chioma E, NP  benzonatate (TESSALON PERLES) 100 MG capsule Take 2 capsules (200 mg total) by mouth 3 (three) times daily as needed for cough. 05/09/19   Iloabachie, Chioma E, NP  budesonide-formoterol (SYMBICORT) 160-4.5 MCG/ACT inhaler Inhale 2 puffs into the lungs every 12 (twelve) hours. 04/11/19 05/11/19  Michiel Cowboy A, PA-C  fluticasone (FLONASE) 50 MCG/ACT nasal spray PLACE ONE SPRAY INTO BOTH NOSTRILS EVERY DAY 04/11/19   McGowan, Carollee Herter A, PA-C  nitroGLYCERIN (NITROSTAT) 0.4 MG SL tablet Place 1 tablet (0.4 mg total) under the tongue every 5 (five) minutes as needed for chest pain. 04/11/19  Zara Council A, PA-C  pravastatin (PRAVACHOL) 20 MG tablet Take 1 tablet (20 mg total) by mouth daily. 04/11/19   Zara Council A, PA-C    Allergies Patient has no known allergies.  Family History  Problem Relation Age of Onset  . Cancer Mother   . Cancer Father     Social History Social History   Tobacco Use  . Smoking status: Never Smoker    . Smokeless tobacco: Never Used  Substance Use Topics  . Alcohol use: Yes    Comment: daily, 4 - 5 beers per day  . Drug use: No    Review of Systems  Constitutional: No fever/chills. Eyes: No visual changes. ENT: No sore throat. Cardiovascular: Positive for chest pain. Respiratory: Denies acute shortness of breath. Gastrointestinal: No vomiting or diarrhea.  Genitourinary: Negative for dysuria.  Musculoskeletal: Negative for back pain. Skin: Negative for rash. Neurological: Negative for headaches, focal weakness or numbness.   ____________________________________________   PHYSICAL EXAM:  VITAL SIGNS: ED Triage Vitals  Enc Vitals Group     BP 05/24/19 1142 (!) 148/92     Pulse Rate 05/24/19 1142 (!) 111     Resp 05/24/19 1142 20     Temp 05/24/19 1142 98 F (36.7 C)     Temp Source 05/24/19 1142 Oral     SpO2 05/24/19 1142 96 %     Weight 05/24/19 1143 264 lb 8.8 oz (120 kg)     Height 05/24/19 1143 5\' 9"  (1.753 m)     Head Circumference --      Peak Flow --      Pain Score 05/24/19 1143 10     Pain Loc --      Pain Edu? --      Excl. in [place]? --     Constitutional: Alert and oriented.  Relatively well appearing and in no acute distress. Eyes: Conjunctivae are normal.  Head: Atraumatic. Nose: No congestion/rhinnorhea. Mouth/Throat: Mucous membranes are moist.   Neck: Normal range of motion.  Cardiovascular: Normal rate, regular rhythm. Grossly normal heart sounds.  Good peripheral circulation. Respiratory: Normal respiratory effort.  No retractions. Lungs CTAB. Gastrointestinal: No distention.  Musculoskeletal: No lower extremity edema.  No calf or popliteal swelling or tenderness.  Extremities warm and well perfused.  Neurologic:  Normal speech and language. No gross focal neurologic deficits are appreciated.  Skin:  Skin is warm and dry. No rash noted. Psychiatric: Mood and affect are normal. Speech and behavior are  normal.  ____________________________________________   LABS (all labs ordered are listed, but only abnormal results are displayed)  Labs Reviewed  BASIC METABOLIC PANEL - Abnormal; Notable for the following components:      Result Value   Glucose, Bld 141 (*)    All other components within normal limits  CBC - Abnormal; Notable for the following components:   RBC 4.16 (*)    MCH 34.4 (*)    MCHC 36.4 (*)    All other components within normal limits  TROPONIN I (HIGH SENSITIVITY)  TROPONIN I (HIGH SENSITIVITY)   ____________________________________________  EKG  ED ECG REPORT I, Arta Silence, the attending physician, personally viewed and interpreted this ECG.  Date: 05/24/2019 EKG Time: 1139 Rate: 111 Rhythm: Sinus tachycardia QRS Axis: normal Intervals: LAFB ST/T Wave abnormalities: LVH with nonspecific ST abnormalities Narrative Interpretation: Nonspecific abnormalities with no evidence of acute ischemia; no significant change when compared to EKG of 08/08/2018  ____________________________________________  RADIOLOGY  CXR: No focal infiltrate or  other acute abnormality  ____________________________________________   PROCEDURES  Procedure(s) performed: No  Procedures  Critical Care performed: No ____________________________________________   INITIAL IMPRESSION / ASSESSMENT AND PLAN / ED COURSE  Pertinent labs & imaging results that were available during my care of the patient were reviewed by me and considered in my medical decision making (see chart for details).  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  I reviewed the past medical records in epic.  The patient follows with Dr. Antoine Poche from cardiology.  He was  last seen last year and had a cardiac catheterization in 2018 showing a 60% lesion in the LAD.  He had a Myoview stress test on 10/22/2018 that was normal.  On exam, he is overall well-appearing.  His vital signs are normal except for minimal hypertension.  The physical exam is otherwise unremarkable.  EKG shows no acute ischemic changes.  Overall I have a low suspicion for ACS or other acute cardiac etiology.  The patient does have a history of CAD but no significant occlusions and a negative recent stress test.  The pain is somewhat atypical, and the patient has had multiple exacerbations of it previously.  I also have a low suspicion for PE given the lack of persistent tachycardia or hypoxia (the patient is normally on 2 L of O2 as needed, but O2 saturation is in the high 90s on room air currently) and no signs or symptoms of DVT.   Initial lab work-up and troponin are negative.  We will obtain a repeat troponin, give additional nitroglycerin and reassess.  ----------------------------------------- 4:25 PM on 05/24/2019 -----------------------------------------  The patient reported significant improvement to his pain before receiving the nitroglycerin, and now after receiving it it is almost completely resolved.  He feels much better and wants to go home.  The repeat troponin is negative.  At this time there is no evidence of ACS.  I suspect most likely musculoskeletal pain from the patient's physical activity today, versus pain related to his chronic pulmonary issues.  I counseled the patient on the results of the work-up.  He is stable for discharge.  Return precautions given, and he expresses understanding.  ____________________________________________   FINAL CLINICAL IMPRESSION(S) / ED DIAGNOSES  Final diagnoses:  Atypical chest pain      NEW MEDICATIONS STARTED DURING THIS VISIT:  New Prescriptions   No medications on file     Note:  This document was prepared using Dragon  voice recognition software and may include unintentional dictation errors.   Dionne Bucy, MD 05/24/19 1626

## 2019-05-24 NOTE — ED Triage Notes (Signed)
Reports left sided chest pain that started while doing physical exertion approx 30 minutes ago. Took 2 SL nitro PTA without relief. Usually CP relieved with nitro per patient. Pt alert and oriented X4, cooperative, RR even and unlabored, color WNL. Pt in NAD.

## 2019-05-24 NOTE — Discharge Instructions (Addendum)
Return to the ER for new, worsening, or persistent severe chest pain, difficulty breathing, weakness, or any other new or worsening symptoms that concern you.  Contact your cardiologist for follow-up.  Continue to take your normal medications as prescribed.

## 2019-05-24 NOTE — ED Triage Notes (Signed)
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 Pt reports took 2 SL nitro PTA.

## 2019-05-27 ENCOUNTER — Encounter: Payer: Self-pay | Admitting: Pulmonary Disease

## 2019-05-27 ENCOUNTER — Ambulatory Visit (INDEPENDENT_AMBULATORY_CARE_PROVIDER_SITE_OTHER): Payer: Self-pay | Admitting: Pulmonary Disease

## 2019-05-27 ENCOUNTER — Other Ambulatory Visit: Payer: Self-pay

## 2019-05-27 VITALS — BP 138/78 | HR 86 | Temp 97.4°F | Ht 69.0 in | Wt 268.8 lb

## 2019-05-27 DIAGNOSIS — R059 Cough, unspecified: Secondary | ICD-10-CM

## 2019-05-27 DIAGNOSIS — E662 Morbid (severe) obesity with alveolar hypoventilation: Secondary | ICD-10-CM

## 2019-05-27 DIAGNOSIS — R0600 Dyspnea, unspecified: Secondary | ICD-10-CM

## 2019-05-27 DIAGNOSIS — J8410 Pulmonary fibrosis, unspecified: Secondary | ICD-10-CM

## 2019-05-27 DIAGNOSIS — K219 Gastro-esophageal reflux disease without esophagitis: Secondary | ICD-10-CM

## 2019-05-27 DIAGNOSIS — R05 Cough: Secondary | ICD-10-CM

## 2019-05-27 DIAGNOSIS — G4720 Circadian rhythm sleep disorder, unspecified type: Secondary | ICD-10-CM

## 2019-05-27 MED ORDER — PANTOPRAZOLE SODIUM 40 MG PO TBEC: 40.0000 mg | DELAYED_RELEASE_TABLET | Freq: Every day | ORAL | 3 refills | Status: DC

## 2019-05-27 NOTE — Progress Notes (Signed)
Subjective:    Patient ID: Jason Cove., male    DOB: 06/08/1955, 64 y.o.   MRN: [id number]  HPI 64 year old who follows here for the issue of dyspnea.  Dyspnea is multifactorial he has issues with restrictive physiology due to obesity, diastolic dysfunction, mild postinflammatory pulmonary fibrosis after an episode of atypical pneumonia and mild reactive airways disease.  Since his prior visit, he developed COVID-19 and February 2021.  He did not require hospitalization.  Was recently on 14 May he presented to the emergency room due to chest pain with radiation to the left arm however was deemed to be atypical chest pain.  Has been advised to schedule a follow-up with cardiology.  Today presents stating that Symbicort does help some of his dyspnea.  He is having issues with nocturnal awakenings and poor quality of sleep.  No snoring is reported.  Note that previously his flow volume loop was consistent perhaps with OSA.  He has not had any chest pain since his visit to the ED.  He does note significant gastroesophageal reflux symptoms and this may have been the cause of his pain.  He notes cough that is worse after eating.  He has not had any fevers, chills or sweats.  He has not had any orthopnea or paroxysmal nocturnal dyspnea.  No lower extremity edema.   Review of Systems A 10 point review of systems was performed and it is as noted above otherwise negative.  Immunization History  Administered Date(s) Administered  . PFIZER SARS-COV-2 Vaccination 04/27/2019, 05/22/2019   Current Meds  Medication Sig  . albuterol (VENTOLIN HFA) 108 (90 Base) MCG/ACT inhaler Inhale 2 puffs into the lungs every 6 (six) hours as needed for wheezing or shortness of breath.  Marland Kitchen aspirin EC 81 MG tablet Take 1 tablet (81 mg total) by mouth daily.  . benzonatate (TESSALON PERLES) 100 MG capsule Take 2 capsules (200 mg total) by mouth 3 (three) times daily as needed for cough.  . fluticasone (FLONASE) 50 MCG/ACT  nasal spray PLACE ONE SPRAY INTO BOTH NOSTRILS EVERY DAY  . nitroGLYCERIN (NITROSTAT) 0.4 MG SL tablet Place 1 tablet (0.4 mg total) under the tongue every 5 (five) minutes as needed for chest pain.  . pravastatin (PRAVACHOL) 20 MG tablet Take 1 tablet (20 mg total) by mouth daily.   No Known Allergies     Objective:   Physical Exam BP 138/78 (BP Location: Left Arm, Cuff Size: Normal)   Pulse 86   Temp (!) 97.4 F (36.3 C) (Temporal)   Ht 5\' 9"  (1.753 m)   Wt 268 lb 12.8 oz (121.9 kg)   SpO2 98%   BMI 39.69 kg/m   GENERAL: Obese, no acute respiratory distress, poor eye contact.  Fully ambulatory. HEAD: Normocephalic, atraumatic.  EYES: Pupils equal, round, reactive to light. No scleral icterus.  MOUTH: Nose/mouth/throat not examined due to masking requirements for COVID 19. NECK: Supple. No thyromegaly. Trachea midline. No JVD. No adenopathy. PULMONARY: Good air entry bilaterally, no adventitious sounds. CARDIOVASCULAR: S1 and S2. Regular rate and rhythm. No rubs murmurs gallops heard. GASTROINTESTINAL: Protuberant abdomen (significant truncal obesity), otherwise benign. MUSCULOSKELETAL: No joint deformity, no clubbing, no edema.  NEUROLOGIC: Awake, alert, speech is fluent. Fully ambulatory. No gait disturbance noted. No focal deficits. SKIN: Intact,warm,dry. Motor exam no rashes. PSYCH: Mood is irascible. Behavior impulsive at times l.      Assessment & Plan:     ICD-10-CM   1. Dyspnea, unspecified type  R06.00 Split  night study   No significant change Multifactorial cause as noted previously  2. Postinflammatory pulmonary fibrosis (HCC)  J84.10    Very mild by CT Likely residual from prior atypical pneumonia episode  3. Sleep pattern disturbance  G47.20 Split night study   Sleep study, split-night in lab Diastolic dysfunction on echo  4. Cough  R05 Split night study   Aggravated post meals Gastroesophageal reflux likely culprit  5. Gastroesophageal reflux  disease, unspecified whether esophagitis present  K21.9 Split night study   Protonix Antireflux measures  6. 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 pantoprazole (PROTONIX) 40 MG tablet    Sig: Take 1 tablet (40 mg total) by mouth daily.    Dispense:  30 tablet    Refill:  3   Discussion:  Patient continues to complain of dyspnea out of proportion to his mild findings.  He does describe significant gastroesophageal reflux symptoms.  We will treat this with Protonix.  He also has had issues with sleep disturbance.  Will obtain a split-night study and lab.  Previously we had ordered overnight oximetry however he was not able to get this test done due to his medical coverage not covering it.  He has resubmitted to his new medical coverage cards and hopefully will be able to obtain a split-night study to be more effective in diagnosing potential issues with nocturnal hypoxemia and/or apnea.  Up in 3 months time.  He should continue follow-up with cardiology, his most recent CT performed in November 2020 that showed coronary artery calcifications and prior cath in 2018 had shown mid LAD lesion which certainly could have worsened in the interim.Renold Don, MD [facility]  *This note was dictated using voice recognition software/Dragon.  Despite best efforts to proofread, errors can occur which can change the meaning.  Any change was purely unintentional.

## 2019-05-27 NOTE — Patient Instructions (Signed)
We are going to schedule a sleep study.   You will also get on medication for reflux I think this is the main cause for your cough.   I recommend that you quit all alcohol intake.   Also recommend that you revisit with a cardiologist (heart specialist).   We will see you in follow-up in 3 months time call sooner should any new difficulties arise.

## 2019-05-29 ENCOUNTER — Encounter: Payer: Self-pay | Admitting: Pulmonary Disease

## 2019-05-29 NOTE — Progress Notes (Signed)
Subjective:    Patient ID: Jason Wiggins., male    DOB: [birth year], 64 y.o.   MRN: [id number]  HPI 64 year old lifelong never smoker evaluated here on 23 October 2018 for the issue of dyspnea and need for on going oxygen use.  Patient has had prior admission at [facility] in April 2020 and appears at that time that he had an atypical pneumonia.  He tested COVID-19 negative at that time.  He had a brief stay in the hospital was discharged and then readmitted due to persistent dyspnea.  He was discharged on oxygen, prednisone and antibiotics.  At that time the CT scan of the chest was consistent with atypical pneumonia type pattern.  The patient continued to complain bitterly of dyspnea and was referred to [place].  At that time he was using oxygen and had not been retested to determine if he requalified.  He underwent ambulatory oximetry on room air and did not show a need for oxygen.  It was recommended that he discontinue use of oxygen.  A 2D echo was ordered to evaluate for potential causes of dyspnea this was performed on 30 October 2018 and showed diastolic dysfunction and normal LVEF.  Following that he has had PFTs on 04 December 2018, the findings showed mild obstructive defect and concomitant restrictive defect, he had significant response to bronchodilators.  Reactive defect is likely on the basis of obesity his ERV is 14%.  Flow volume loop is concerning for potential upper airway resistance such as seen in OSA.  Corrected diffusion capacity was normal.  CT scan of the chest high-resolution on 30 November this shows patchy parenchymal fibrosis is nonspecific and likely related to scarring from his prior pneumonia event.  This pattern is usually not progressive.  He has hepatic steatosis as well.  The patient presents today again complaining bitterly of dyspnea.  Oxygen saturations are 100%.  He has not had any fevers, chills or sweats but has noted a sore throat and nasal congestion over the last 3 days.   He also notes nonproductive cough but no sputum production or hemoptysis.  Review of Systems A 10 point review of systems was performed and it is as noted above otherwise negative.  No Known Allergies  Medications he is currently taking have been reviewed.  As noted.  All studies have been independently reviewed.  Social History   Tobacco Use  . Smoking status: Never Smoker  . Smokeless tobacco: Never Used  Substance Use Topics  . Alcohol use: Yes    Comment: daily, 4 - 5 beers per day       Objective:   Physical Exam BP 128/88 (BP Location: Left Arm, Patient Position: Sitting, Cuff Size: Large)   Pulse 71   Temp 98.6 F (37 C) (Temporal)   Ht 5\' 9"  (1.753 m)   Wt 271 lb 3.2 oz (123 kg)   SpO2 100% Comment: RA  BMI 40.05 kg/m   GENERAL: Obese gentleman, no acute respiratory distress, sounds nasally congested, poor eye contact. HEAD: Normocephalic, atraumatic.  EYES: Pupils equal, round, reactive to light.  No scleral icterus.  MOUTH: Nose/mouth/throat not examined due to masking requirements for COVID 19. NECK: Supple. No thyromegaly. Trachea midline. No JVD.  No adenopathy. PULMONARY: Good air entry bilaterally, coarse breath sounds, no other adventitious sounds. CARDIOVASCULAR: S1 and S2. Regular rate and rhythm.  No rubs murmurs gallops heard. GASTROINTESTINAL: Protuberant abdomen, otherwise benign. MUSCULOSKELETAL: No joint deformity, no clubbing, no edema.  NEUROLOGIC: Awake, alert,  speech is fluent.  Fully ambulatory.  No gait disturbance noted.  No focal deficits. SKIN: Intact,warm,dry.  Motor exam no rashes. PSYCH: Mood is irascible.  Behavior normal.  CT scan of the chest performed December 10, 2018 was reviewed independently representative slice as below:   Assessment & Plan:     ICD-10-CM   1. Postinflammatory pulmonary fibrosis (HCC)  J84.10    Likely a result of his atypical pneumonia previously Not a typical pattern for UIP/IPF This type of  pattern is usually not progressive  2. Dyspnea, unspecified type  R06.00    Multifactorial: Obesity, diastolic dysfunction, mild airways reactivity Postinflammatory fibrosis, mild, usually not progressive  3. Reactive airways dysfunction syndrome (HCC)  J68.3    As noted, mild Trial of Symbicort  4. Diastolic dysfunction  I51.89    This issue adds complexity to his management  5. 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  He does not require daytime oxygen.  Because his echocardiogram does show diastolic dysfunction will obtain overnight oximetry to determine if he has any significant oxygen desaturations.  We will give him a trial of Symbicort 160/4.5, 2 inhalations twice a day for the reversible airways component of his airway disease.  He was provided a rescue inhaler as well.  This is mild and his symptoms are out of proportion to the mild defect noted.  He is encouraged to lose weight.  He is also encouraged to stay active.  Patient it is highly encourage that he discontinue use of alcohol completely given incidentally noted hepatic steatosis on CT scan.  We will see the patient in follow-up in 3 months time he is to contact us prior to that time should any new difficulties arise.   Gailen Shelter, MD [place] [facility]  *This note was dictated using voice recognition software/Dragon.  Despite best efforts to proofread, errors can occur which can change the meaning.  Any change was purely unintentional.

## 2019-06-13 ENCOUNTER — Ambulatory Visit: Payer: Medicaid Other | Admitting: Gerontology

## 2019-06-13 ENCOUNTER — Other Ambulatory Visit: Payer: Self-pay

## 2019-06-13 VITALS — BP 137/89 | HR 86 | Ht 69.0 in | Wt 267.0 lb

## 2019-06-13 DIAGNOSIS — R059 Cough, unspecified: Secondary | ICD-10-CM

## 2019-06-13 DIAGNOSIS — R0689 Other abnormalities of breathing: Secondary | ICD-10-CM

## 2019-06-13 DIAGNOSIS — R079 Chest pain, unspecified: Secondary | ICD-10-CM

## 2019-06-13 DIAGNOSIS — J3489 Other specified disorders of nose and nasal sinuses: Secondary | ICD-10-CM

## 2019-06-13 MED ORDER — FLUTICASONE PROPIONATE 50 MCG/ACT NA SUSP: NASAL | 0 refills | Status: DC

## 2019-06-13 NOTE — Progress Notes (Signed)
Established Patient Office Visit  Subjective:  Patient ID: Jason Wiggins., male    DOB: 1955/04/04  Age: 64 y.o. MRN: [id number]  CC:  Chief Complaint  Patient presents with  . Shortness of Breath    HPI Jason Wiggins. presents for follow up of dyspnea and medication refill. He was seen at the ED for left sided Atypical chest.Chest X ray done showed no acute cardiopulmonary disease per Dr Drema Pry. He continues to use 2L Oxygen at night and will follow up with the split night study. He was seen on 05/27/2019 by the Pulmonologist Dr Jayme Cloud C. L for Dyspnea, and was started on 40 mg Protonix for [place]. Currently, he states that he continues to experience intermittent left sided chest pain, that occurs when he coughs and also when he stretches lying down. He states that his cough is non productive and it occurs when he over exerts himself. He states that he has not taking Nitroglycerine since his ED visit. He states that he stopped taking his Protonix because it upsets his stomach and he gets acid reflux only when he eats tomatoes. Overall, he states that he's concerned about his chest pain, and offers no further complaint.  Past Medical History:  Diagnosis Date  . Alcohol abuse    possibly as much as 24 beers per day,in past  . Arthritis   . HTN (hypertension)     Past Surgical History:  Procedure Laterality Date  . HERNIA REPAIR     Right inguinal  . INTRAVASCULAR PRESSURE WIRE/FFR STUDY N/A 10/28/2016   Procedure: INTRAVASCULAR PRESSURE WIRE/FFR STUDY;  Surgeon: Marykay Lex, MD;  Location: [facility] INVASIVE CV LAB;  Service: Cardiovascular;  Laterality: N/A;  . LEFT HEART CATH AND CORONARY ANGIOGRAPHY N/A 10/28/2016   Procedure: LEFT HEART CATH AND CORONARY ANGIOGRAPHY;  Surgeon: Marykay Lex, MD;  Location: [facility] INVASIVE CV LAB;  Service: Cardiovascular;  Laterality: N/A;    Family History  Problem Relation Age of Onset  . Cancer Mother   . Cancer Father     Social History    Socioeconomic History  . Marital status: Legally Separated    Spouse name: Not on file  . Number of children: 2  . Years of education: Not on file  . Highest education level: Not on file  Occupational History  . Not on file  Tobacco Use  . Smoking status: Never Smoker  . Smokeless tobacco: Never Used  Substance and Sexual Activity  . Alcohol use: Yes    Comment: daily, 4 - 5 beers per day  . Drug use: No  . Sexual activity: Yes    Birth control/protection: Condom  Other Topics Concern  . Not on file  Social History Narrative   Lives with sister.  Lost his job.     Social Determinants of Health   Financial Resource Strain:   . Difficulty of Paying Living Expenses:   Food Insecurity: Food Insecurity Present  . Worried About Programme researcher, broadcasting/film/video in the Last Year: Often true  . Ran Out of Food in the Last Year: Often true  Transportation Needs: No Transportation Needs  . Lack of Transportation (Medical): No  . Lack of Transportation (Non-Medical): No  Physical Activity: Inactive  . Days of Exercise per Week: 0 days  . Minutes of Exercise per Session: 0 min  Stress: Stress Concern Present  . Feeling of Stress : Very much  Social Connections:   . Frequency of Communication with Friends  and Family:   . Frequency of Social Gatherings with Friends and Family:   . Attends Religious Services:   . Active Member of Clubs or Organizations:   . Attends Archivist Meetings:   Marland Kitchen Marital Status:   Intimate Partner Violence: Not At Risk  . Fear of Current or Ex-Partner: No  . Emotionally Abused: No  . Physically Abused: No  . Sexually Abused: No    Outpatient Medications Prior to Visit  Medication Sig Dispense Refill  . albuterol (VENTOLIN HFA) 108 (90 Base) MCG/ACT inhaler Inhale 2 puffs into the lungs every 6 (six) hours as needed for wheezing or shortness of breath. 6.7 g 2  . aspirin EC 81 MG tablet Take 1 tablet (81 mg total) by mouth daily. 30 tablet 2  .  benzonatate (TESSALON PERLES) 100 MG capsule Take 2 capsules (200 mg total) by mouth 3 (three) times daily as needed for cough. 20 capsule 0  . budesonide-formoterol (SYMBICORT) 160-4.5 MCG/ACT inhaler Inhale 2 puffs into the lungs every 12 (twelve) hours. 1 Inhaler 5  . pravastatin (PRAVACHOL) 20 MG tablet Take 1 tablet (20 mg total) by mouth daily. 30 tablet 2  . fluticasone (FLONASE) 50 MCG/ACT nasal spray PLACE ONE SPRAY INTO BOTH NOSTRILS EVERY DAY 16 g 0  . nitroGLYCERIN (NITROSTAT) 0.4 MG SL tablet Place 1 tablet (0.4 mg total) under the tongue every 5 (five) minutes as needed for chest pain. 30 tablet 1  . pantoprazole (PROTONIX) 40 MG tablet Take 1 tablet (40 mg total) by mouth daily. (Patient not taking: Reported on 06/13/2019) 30 tablet 3   No facility-administered medications prior to visit.    No Known Allergies  ROS Review of Systems  Constitutional: Negative.   Respiratory: Positive for cough and wheezing. Negative for chest tightness and shortness of breath.   Cardiovascular: Positive for chest pain.  Neurological: Negative.   Psychiatric/Behavioral: Negative.       Objective:    Physical Exam  Constitutional: He is oriented to person, place, and time. He appears well-developed.  HENT:  Head: Normocephalic and atraumatic.  Cardiovascular: Normal rate and regular rhythm.  Pulmonary/Chest: He has decreased breath sounds in the right upper field, the right middle field, the right lower field, the left upper field, the left middle field and the left lower field.  Neurological: He is alert and oriented to person, place, and time.  Psychiatric: He has a normal mood and affect. His behavior is normal. Judgment and thought content normal.    BP 137/89 (BP Location: Right Arm, Patient Position: Sitting)   Pulse 86   Ht 5\' 9"  (1.753 m)   Wt 267 lb (121.1 kg)   SpO2 96%   BMI 39.43 kg/m  Wt Readings from Last 3 Encounters:  06/13/19 267 lb (121.1 kg)  05/27/19 268 lb  12.8 oz (121.9 kg)  05/24/19 264 lb 8.8 oz (120 kg)   He's encouraged to continue on a weight loss regimen  Health Maintenance Due  Topic Date Due  . Hepatitis C Screening  Never done  . TETANUS/TDAP  Never done    There are no preventive care reminders to display for this patient.  Lab Results  Component Value Date   TSH 1.733 04/26/2018   Lab Results  Component Value Date   WBC 4.3 05/24/2019   HGB 14.3 05/24/2019   HCT 39.3 05/24/2019   MCV 94.5 05/24/2019   PLT 159 05/24/2019   Lab Results  Component Value Date  NA 143 05/24/2019   K 3.8 05/24/2019   CO2 26 05/24/2019   GLUCOSE 141 (H) 05/24/2019   BUN 16 05/24/2019   CREATININE 1.14 05/24/2019   BILITOT 0.4 09/19/2018   ALKPHOS 73 09/19/2018   AST 39 09/19/2018   ALT 55 (H) 09/19/2018   PROT 6.3 09/19/2018   ALBUMIN 4.3 09/19/2018   CALCIUM 9.5 05/24/2019   ANIONGAP 9 05/24/2019   Lab Results  Component Value Date   CHOL 195 02/13/2019   Lab Results  Component Value Date   HDL 43 02/13/2019   Lab Results  Component Value Date   LDLCALC 85 02/13/2019   Lab Results  Component Value Date   TRIG 412 (H) 02/13/2019   Lab Results  Component Value Date   CHOLHDL 4.5 02/13/2019   Lab Results  Component Value Date   HGBA1C 5.4 08/22/2018      Assessment & Plan:    1. Rhinorrhea - He will continue on current treatment regimen. - fluticasone (FLONASE) 50 MCG/ACT nasal spray; PLACE ONE SPRAY INTO BOTH NOSTRILS EVERY DAY  Dispense: 16 g; Refill: 0  2. Cough - He will continue on otc Mucinex and to notify clinic with worsening symptoms.  3. Chest pain on exertion - He will follow up with the Cardiology for evaluation of his recurrent chest pain. His EKG done during his ED visit was abnormal. He was advised to go to the ED for worsening chest. - Ambulatory referral to Cardiology  4. Decreased breath sounds - He was provided with Incentive Spirometer and educated on how to use it. He was  advised to go to the ED with worsening symptoms.    Follow-up: Return in about 2 months (around 08/14/2019), or if symptoms worsen or fail to improve.    Rufina Kimery Trellis Paganini, NP

## 2019-06-18 NOTE — Progress Notes (Deleted)
Office Visit    Patient Name: Jason Wiggins. Date of Encounter: 06/18/2019  Primary Care Provider:  Langston Reusing, NP Primary Cardiologist:  No primary care provider on file. Electrophysiologist:  None   Chief Complaint    Jason Wiggins. is a 64 y.o. male with a hx of chest pain, nonobstructive coronary disease, HTN, HLD, dyspnea, pneumnia, bronchitis*** presents today for ***   Past Medical History    Past Medical History:  Diagnosis Date  . Alcohol abuse    possibly as much as 24 beers per day,in past  . Arthritis   . HTN (hypertension)    Past Surgical History:  Procedure Laterality Date  . HERNIA REPAIR     Right inguinal  . INTRAVASCULAR PRESSURE WIRE/FFR STUDY N/A 10/28/2016   Procedure: INTRAVASCULAR PRESSURE WIRE/FFR STUDY;  Surgeon: Leonie Man, MD;  Location: [place] CV LAB;  Service: Cardiovascular;  Laterality: N/A;  . LEFT HEART CATH AND CORONARY ANGIOGRAPHY N/A 10/28/2016   Procedure: LEFT HEART CATH AND CORONARY ANGIOGRAPHY;  Surgeon: Leonie Man, MD;  Location: [facility];  Service: Cardiovascular;  Laterality: N/A;    Allergies  No Known Allergies  History of Present Illness    Jason Wiggins. is a 64 y.o. male with a hx of chest pain, nonobstructive coronary disease, HTN, HLD, dyspnea, pneumnia, bronchitis. He was last seen by Dr. Percival Spanish 10/15/18.  Seen in ED 2018 for chest pain concern for unstable angina, but left AMA. Had CT with LAD calcification. Cardiac cath *** with 60% LAD lesions.   At last clinic visit his BP was not well controlled and LDL not at goal. His Chlorthalidone and Pravastatin were increased. He was recommended for lexiscan myoview.   EKGs/Labs/Other Studies Reviewed:   The following studies were reviewed today: ***  EKG:  EKG is  ordered today.  The ekg ordered today demonstrates ***  Recent Labs: 09/19/2018: ALT 55; BNP 16.1 05/24/2019: BUN 16; Creatinine, Ser 1.14; Hemoglobin 14.3; Platelets  159; Potassium 3.8; Sodium 143  Recent Lipid Panel    Component Value Date/Time   CHOL 195 02/13/2019 1147   TRIG 412 (H) 02/13/2019 1147   HDL 43 02/13/2019 1147   CHOLHDL 4.5 02/13/2019 1147   CHOLHDL 3.9 04/23/2018 0448   VLDL 20 04/23/2018 0448   LDLCALC 85 02/13/2019 1147    Home Medications   No outpatient medications have been marked as taking for the 06/19/19 encounter (Appointment) with Loel Dubonnet, NP.      Review of Systems    ***   ROS All other systems reviewed and are otherwise negative except as noted above.  Physical Exam    VS:  There were no vitals taken for this visit. , BMI There is no height or weight on file to calculate BMI. GEN: Well nourished, well developed, in no acute distress. HEENT: normal. Neck: Supple, no JVD, carotid bruits, or masses. Cardiac: ***RRR, no murmurs, rubs, or gallops. No clubbing, cyanosis, edema.  ***Radials/DP/PT 2+ and equal bilaterally.  Respiratory:  ***Respirations regular and unlabored, clear to auscultation bilaterally. GI: Soft, nontender, nondistended, BS + x 4. MS: No deformity or atrophy. Skin: Warm and dry, no rash. Neuro:  Strength and sensation are intact. Psych: Normal affect.  Assessment & Plan    1. Chest pain -  2. Nonobstructive CAD -  3. HTN -  4. HLD -   Disposition: Follow up {follow up:15908} with ***   Loel Dubonnet, NP  06/18/2019, 1:05 PM

## 2019-06-19 ENCOUNTER — Ambulatory Visit: Payer: Self-pay | Admitting: Family

## 2019-06-20 ENCOUNTER — Encounter: Payer: Self-pay | Admitting: Nurse Practitioner

## 2019-06-20 ENCOUNTER — Other Ambulatory Visit: Payer: Self-pay

## 2019-06-20 ENCOUNTER — Ambulatory Visit (INDEPENDENT_AMBULATORY_CARE_PROVIDER_SITE_OTHER): Payer: Self-pay | Admitting: Nurse Practitioner

## 2019-06-20 VITALS — BP 140/100 | HR 89 | Ht 69.0 in | Wt 269.0 lb

## 2019-06-20 DIAGNOSIS — I10 Essential (primary) hypertension: Secondary | ICD-10-CM

## 2019-06-20 DIAGNOSIS — I2510 Atherosclerotic heart disease of native coronary artery without angina pectoris: Secondary | ICD-10-CM

## 2019-06-20 DIAGNOSIS — E782 Mixed hyperlipidemia: Secondary | ICD-10-CM

## 2019-06-20 MED ORDER — CHLORTHALIDONE 25 MG PO TABS: 25.0000 mg | ORAL_TABLET | Freq: Every day | ORAL | 3 refills | Status: DC

## 2019-06-20 NOTE — Progress Notes (Signed)
Office Visit    Patient Name: Jason Wiggins. Date of Encounter: 06/20/2019  Primary Care Provider:  Rolm Gala, NP Primary Cardiologist:  No primary care provider on file. - Prev J. 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  Past Medical History    Past Medical History:  Diagnosis Date  . Alcohol abuse    possibly as much as 24 beers per day,in past  . Arthritis   . COVID-19 virus infection 02/2019  . History of echocardiogram    a. 10/2018 Echo: EF 55-60%, borderline LVH. Diast dysfxn. No significant valvular dzs.  Marland Kitchen HTN (hypertension)   . Nonobstructive CAD (coronary artery disease)    a. 10/2016 Cath: LM nl, LAD 48m (FFR nl @ 0.83), LCX nl, RCA nl, EF nl; b. 10/2018 MV: no ischemia/infarct. EF 55-60%. Low risk.   Past Surgical History:  Procedure Laterality Date  . CARDIAC CATHETERIZATION    . HERNIA REPAIR     Right inguinal  . INTRAVASCULAR PRESSURE WIRE/FFR STUDY N/A 10/28/2016   Procedure: INTRAVASCULAR PRESSURE WIRE/FFR STUDY;  Surgeon: Marykay Lex, MD;  Location: [facility] INVASIVE CV LAB;  Service: Cardiovascular;  Laterality: N/A;  . 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  He was last seen in cardiology clinic in October 2020 in our [place] office by Dr.  Antoine Poche in the setting of atypical chest pain.  Stress testing was undertaken and was low risk.  Echocardiogram showed normal LV function without significant valvular disease.    In February of this year, he was diagnosed with COVID-19.  He has been dealing with a chronic cough as well as dyspnea on exertion which both predate his Covid infection.  He does have oxygen at home and does wear it during hours of sleep but only uses it as needed during the day.  In the setting of persistent coughing, he does occasionally experience left-sided chest discomfort that is made worse by coughing, deep breathing, palpation, and certain position changes.  He was seen in the emergency department in May after developing chest discomfort while helping a family member tear the roof off of his house.  Discomfort did not improve after 2 sublingual nitroglycerin and was worse with palpation.  Due to persistent symptoms, he presented to the emergency department where ECG was unremarkable and troponins were normal despite 8 hours of persistent chest discomfort.  He was subsequently discharged home and pain was felt to be musculoskeletal in nature.  He has continued to have intermittent left-sided chest wall pain as outlined above.  He does not typically experience exertional chest pain but does have chronic dyspnea on exertion.  He does not typically wear his oxygen when he is outside of his house however.  He denies palpitations, PND, orthopnea, dizziness, syncope, edema, or early satiety.  He is aware that he has hypertension but previously  stopped isosorbide and amlodipine secondary to headaches.  He was placed on chlorthalidone at his cardiology office visit in October but says he ran out of it and just never refilled.  He would be willing to accept another prescription for chlorthalidone.  Home Medications    Prior to Admission medications   Medication Sig Start Date End Date Taking? Authorizing Provider  albuterol  (VENTOLIN HFA) 108 (90 Base) MCG/ACT inhaler Inhale 2 puffs into the lungs every 6 (six) hours as needed for wheezing or shortness of breath. 04/11/19   McGowan, Wellington Hampshire, PA-C  aspirin EC 81 MG tablet Take 1 tablet (81 mg total) by mouth daily. 08/21/18 08/21/19  Iloabachie, Chioma E, NP  benzonatate (TESSALON PERLES) 100 MG capsule Take 2 capsules (200 mg total) by mouth 3 (three) times daily as needed for cough. 05/09/19   Iloabachie, Chioma E, NP  budesonide-formoterol (SYMBICORT) 160-4.5 MCG/ACT inhaler Inhale 2 puffs into the lungs every 12 (twelve) hours. 04/11/19 06/13/19  Michiel Cowboy A, PA-C  fluticasone (FLONASE) 50 MCG/ACT nasal spray PLACE ONE SPRAY INTO BOTH NOSTRILS EVERY DAY 06/13/19   Iloabachie, Chioma E, NP  nitroGLYCERIN (NITROSTAT) 0.4 MG SL tablet Place 1 tablet (0.4 mg total) under the tongue every 5 (five) minutes as needed for chest pain. 04/11/19   Michiel Cowboy A, PA-C  pravastatin (PRAVACHOL) 20 MG tablet Take 1 tablet (20 mg total) by mouth daily. 04/11/19   Harle Battiest, PA-C    Review of Systems    Musculoskeletal and pleuritic chest pain in the setting of frequent coughing.  Chronic dyspnea on exertion which is unchanged.  He denies palpitations, PND, orthopnea, dizziness, syncope, edema, or early satiety.  All other systems reviewed and are otherwise negative except as noted above.  Physical Exam    VS:  BP (!) 140/100 (BP Location: Left Arm, Patient Position: Sitting, Cuff Size: Large)   Pulse 89   Ht 5\' 9"  (1.753 m)   Wt 269 lb (122 kg)   SpO2 95%   BMI 39.72 kg/m  , BMI Body mass index is 39.72 kg/m.  BP 150/98 on repeat GEN: Well nourished, well developed, in no acute distress. HEENT: normal. Neck: Supple, no JVD, carotid bruits, or masses. Cardiac: RRR, no murmurs, rubs, or gallops. No clubbing, cyanosis, edema.  Radials/PT 2+ and equal bilaterally.  Respiratory:  Respirations regular and unlabored, clear to auscultation bilaterally. GI: Soft,  nontender, nondistended, BS + x 4. MS: no deformity or atrophy. Skin: warm and dry, no rash. Neuro:  Strength and sensation are intact. Psych: Normal affect.  Accessory Clinical Findings    ECG personally reviewed by me today -regular sinus rhythm, 89, left axis deviation, left anterior fascicular block, poor R wave progression- no acute changes.  Lab Results  Component Value Date   WBC 4.3 05/24/2019   HGB 14.3 05/24/2019   HCT 39.3 05/24/2019   MCV 94.5 05/24/2019   PLT 159 05/24/2019   Lab Results  Component Value Date   CREATININE 1.14 05/24/2019   BUN 16 05/24/2019   NA 143 05/24/2019   K 3.8 05/24/2019   CL 108 05/24/2019   CO2 26 05/24/2019   Lab Results  Component Value Date   ALT 55 (H) 09/19/2018   AST 39 09/19/2018   ALKPHOS 73 09/19/2018   BILITOT 0.4 09/19/2018   Lab Results  Component Value Date   CHOL 195 02/13/2019   HDL 43 02/13/2019   LDLCALC 85 02/13/2019   TRIG 412 (H) 02/13/2019  CHOLHDL 4.5 02/13/2019    Lab Results  Component Value Date   HGBA1C 5.4 08/22/2018    Assessment & Plan    1.  Coronary artery disease: Patient with a history of chest pain and diagnostic catheterization in October 2018 revealing 60% mid LAD stenosis.  He has had intermittent chest discomfort over the years and underwent stress testing October 2020 which was low risk.  He is known to have coronary calcifications on chest CT.  He was recently seen in the emergency department in May of this year secondary to persistent chest pain over period of 8 hours.  This was worse with palpation.  Troponins were normal and symptoms were felt to be musculoskeletal in nature.  He notes that ever since then, he will still experience intermittent left-sided musculoskeletal and pleuritic chest discomfort that is worse with coughing, deep breathing, palpation, and certain position changes such as moving his left arm.  Given atypical nature of chest pain, no role for repeat ischemic  evaluation at this time.  We discussed the importance of ongoing risk factor modification including regular exercise and weight loss.  Exercise limited by chronic dyspnea on exertion for which he is supposed to be wearing oxygen but does not usually wear this outside of his house.  He remains on aspirin and statin therapy.  He is hypertensive and came off of chlorthalidone at some point.  I will resume today.  2.  Essential hypertension: Blood pressure elevated today at 140/100.  He says that antihypertensive previously gave him migraines and initially said he was not interested in resuming.  Previous med list included isosorbide and amlodipine, which she had stopped in the setting of headaches previously.  In October, he was placed on chlorthalidone and says that he tolerated that and is not sure why he came off of it.  I will represcribe chlorthalidone 25 mg daily today with plan for follow-up basic metabolic panel in a week.  He may require potassium supplementation.  He does have a blood pressure cuff at home and he says that his sister has been checking it for him.  3.  Hyperlipidemia: He is on pravastatin therapy with an LDL of 85 in February.  ALT was mildly elevated last September.  He still drinks 4-5 beers per day-cessation advised.  Again, encouraged lifestyle modification as he has been reluctant to titrate statin further or change to a higher potency medication.  4.  Alcohol abuse: Drinking 4-5 beers per day.  Cessation advised.  5.  Disposition: Follow-up basic metabolic panel in 1 week.  Follow-up in clinic in 3 months or sooner if necessary.   Murray Hodgkins, NP 06/20/2019, 10:17 AM

## 2019-06-20 NOTE — Patient Instructions (Addendum)
Medication Instructions:  - Your physician has recommended you make the following change in your medication:   1) Start chlorthalidone 25 mg- take 1 tablet by mouth once daily   *If you need a refill on your cardiac medications before your next appointment, please call your pharmacy*   Lab Work: - Your physician recommends that you return for lab work in: 1 week (around 06/27/19)- BMP - come to the Medical Mall entrance at [facility], 1st desk on the right (Registration) to check in- this is just past the screening table - done on a walk in basis: lab hours are Monday- Friday (7:30 am- 5:30 pm) - you do not have to be fasting   If you have labs (blood work) drawn today and your tests are completely normal, you will receive your results only by: Marland Kitchen MyChart Message (if you have MyChart) OR . A paper copy in the mail If you have any lab test that is abnormal or we need to change your treatment, we will call you to review the results.   Testing/Procedures: - none ordered   Follow-Up: At [facility], you and your health needs are our priority.  As part of our continuing mission to provide you with exceptional heart care, we have created designated Provider Care Teams.  These Care Teams include your primary Cardiologist (physician) and Advanced Practice Providers (APPs -  Physician Assistants and Nurse Practitioners) who all work together to provide you with the care you need, when you need it.  We recommend signing up for the patient portal called "MyChart".  Sign up information is provided on this After Visit Summary.  MyChart is used to connect with patients for Virtual Visits (Telemedicine).  Patients are able to view lab/test results, encounter notes, upcoming appointments, etc.  Non-urgent messages can be sent to your provider as well.   To learn more about what you can do with MyChart, go to [email or web address].    Your next appointment:   3 month(s)  The format for your next  appointment:   In Person  Provider:   Debbe Odea, MD (establish with an MD in [place])   Other Instructions n/a

## 2019-06-27 ENCOUNTER — Telehealth: Payer: Self-pay

## 2019-06-27 ENCOUNTER — Other Ambulatory Visit
Admission: RE | Admit: 2019-06-27 | Discharge: 2019-06-27 | Disposition: A | Discharge status: Home / Self Care | Payer: Medicaid Other | Attending: Nurse Practitioner | Admitting: Nurse Practitioner

## 2019-06-27 ENCOUNTER — Other Ambulatory Visit: Payer: Self-pay

## 2019-06-27 DIAGNOSIS — I2510 Atherosclerotic heart disease of native coronary artery without angina pectoris: Secondary | ICD-10-CM

## 2019-06-27 DIAGNOSIS — I10 Essential (primary) hypertension: Secondary | ICD-10-CM

## 2019-06-27 LAB — BASIC METABOLIC PANEL
Anion gap: 13 (ref 5–15)
BUN: 13 mg/dL (ref 8–23)
CO2: 28 mmol/L (ref 22–32)
Calcium: 9.3 mg/dL (ref 8.9–10.3)
Chloride: 99 mmol/L (ref 98–111)
Creatinine, Ser: 0.83 mg/dL (ref 0.61–1.24)
GFR calc Af Amer: >60 mL/min (ref >60)
GFR calc non Af Amer: >60 mL/min (ref >60)
Glucose, Bld: 118 mg/dL — ABNORMAL HIGH (ref 70–99)
Potassium: 3.2 mmol/L — ABNORMAL LOW (ref 3.5–5.1)
Sodium: 140 mmol/L (ref 135–145)

## 2019-06-27 NOTE — Telephone Encounter (Signed)
All attempted. No answer, no vm.

## 2019-06-27 NOTE — Telephone Encounter (Signed)
-----   Message from Creig Hines, NP sent at 06/27/2019 11:37 AM EDT ----- Potassium low since starting chlorthalidone.  Please add Kdur daily.  F/u bmet in 1 week.

## 2019-07-04 ENCOUNTER — Telehealth: Payer: Self-pay

## 2019-07-04 NOTE — Telephone Encounter (Signed)
Pt is aware of date/time of covid test prior to sleep study.  07/09/2019 [facility]  Nothing further is needed.

## 2019-07-09 ENCOUNTER — Other Ambulatory Visit
Admission: RE | Admit: 2019-07-09 | Discharge: 2019-07-09 | Disposition: A | Discharge status: Home / Self Care | Payer: Medicaid Other | Source: Ambulatory Visit | Attending: Pulmonary Disease | Admitting: Pulmonary Disease

## 2019-07-09 ENCOUNTER — Other Ambulatory Visit: Payer: Self-pay

## 2019-07-09 ENCOUNTER — Other Ambulatory Visit: Payer: Self-pay | Admitting: *Deleted

## 2019-07-09 DIAGNOSIS — Z01812 Encounter for preprocedural laboratory examination: Secondary | ICD-10-CM | POA: Diagnosis not present

## 2019-07-09 DIAGNOSIS — Z20822 Contact with and (suspected) exposure to covid-19: Secondary | ICD-10-CM | POA: Diagnosis not present

## 2019-07-09 LAB — SARS CORONAVIRUS 2 (TAT 6-24 HRS): SARS Coronavirus 2: NEGATIVE

## 2019-07-09 MED ORDER — POTASSIUM CHLORIDE CRYS ER 20 MEQ PO TBCR: 40.0000 meq | EXTENDED_RELEASE_TABLET | Freq: Every day | ORAL | 3 refills | Status: DC

## 2019-07-09 NOTE — Telephone Encounter (Signed)
Call to patient to review labs.    Pt verbalized understanding and has no further questions at this time.    Advised pt to call for any further questions or concerns.  Orders placed as advised.   

## 2019-07-09 NOTE — Addendum Note (Signed)
Addended by: Efrain Sella on: 07/09/2019 10:15 AM   Modules accepted: Orders

## 2019-07-11 ENCOUNTER — Telehealth: Payer: Self-pay | Admitting: Pulmonary Disease

## 2019-07-11 DIAGNOSIS — G4719 Other hypersomnia: Secondary | ICD-10-CM

## 2019-07-11 NOTE — Telephone Encounter (Signed)
Jason Wiggins with [facility] called and stated that in lab Split Night was canceled due to pt's insurance Medicaid-Family Planning only.   Jason Wiggins stated that patient was upset and wanted to advise [place] of why the study was canceled.  Pt also has qualified for the [employer].  Jason Wiggins will check to see if HST are covered under this program and will advise. Jason Wiggins

## 2019-07-16 NOTE — Telephone Encounter (Signed)
Message sent to Jason Wiggins to inquire if the [facility] Discount program would cover a HST. Waiting on reply from [place]. [name]

## 2019-07-22 NOTE — Addendum Note (Signed)
Addended by: Lajoyce Lauber A on: 07/22/2019 04:59 PM   Modules accepted: Orders

## 2019-07-22 NOTE — Telephone Encounter (Signed)
HST has been ordered. Nothing further is needed.

## 2019-07-22 NOTE — Telephone Encounter (Signed)
Margie-please cancel in lab study and order HST per Dr. Jayme Cloud.  Thank you. [name]

## 2019-07-22 NOTE — Telephone Encounter (Signed)
HST will be fine  

## 2019-07-22 NOTE — Telephone Encounter (Signed)
Checked with Prince Solian with [facility] and Marisue Ivan stated that [3:50 PM] Prince Solian     "Yes, It should cover the cost of the HST.  Let me know when he is scheduled, I will have to add that coverage for that test once it is done."  Will send message to provider to see if HST can be ordered for patient. [name]  Please advise if patient can have a HST to diagnosis possible OSA due to patient not being able to have in lab study due to insurance. [name]

## 2019-07-23 ENCOUNTER — Telehealth: Payer: Self-pay

## 2019-07-23 ENCOUNTER — Other Ambulatory Visit
Admission: RE | Admit: 2019-07-23 | Discharge: 2019-07-23 | Disposition: A | Discharge status: Home / Self Care | Payer: Medicaid Other | Attending: Nurse Practitioner | Admitting: Nurse Practitioner

## 2019-07-23 DIAGNOSIS — I10 Essential (primary) hypertension: Secondary | ICD-10-CM | POA: Insufficient documentation

## 2019-07-23 DIAGNOSIS — I2510 Atherosclerotic heart disease of native coronary artery without angina pectoris: Secondary | ICD-10-CM

## 2019-07-23 LAB — BASIC METABOLIC PANEL
Anion gap: 13 (ref 5–15)
BUN: 15 mg/dL (ref 8–23)
CO2: 26 mmol/L (ref 22–32)
Calcium: 9.6 mg/dL (ref 8.9–10.3)
Chloride: 101 mmol/L (ref 98–111)
Creatinine, Ser: 0.92 mg/dL (ref 0.61–1.24)
GFR calc Af Amer: >60 mL/min (ref >60)
GFR calc non Af Amer: >60 mL/min (ref >60)
Glucose, Bld: 155 mg/dL — ABNORMAL HIGH (ref 70–99)
Potassium: 3.1 mmol/L — ABNORMAL LOW (ref 3.5–5.1)
Sodium: 140 mmol/L (ref 135–145)

## 2019-07-23 MED ORDER — POTASSIUM CHLORIDE CRYS ER 20 MEQ PO TBCR: 40.0000 meq | EXTENDED_RELEASE_TABLET | Freq: Two times a day (BID) | ORAL | 3 refills | Status: DC

## 2019-07-23 NOTE — Telephone Encounter (Signed)
-----   Message from Creig Hines, NP sent at 07/23/2019  2:19 PM EDT ----- Potassium remains low.  Please ensure that he is currently taking kdur daily (added 6/17).  If so, he'll need to increase to bid w/ f/u bmet in a week.

## 2019-07-23 NOTE — Telephone Encounter (Signed)
Call to patient to review labs.    Pt verbalized understanding and has no further questions at this time.    Advised pt to call for any further questions or concerns.  Orders placed as advised.   

## 2019-07-29 ENCOUNTER — Other Ambulatory Visit: Payer: Self-pay | Admitting: Gerontology

## 2019-08-01 ENCOUNTER — Other Ambulatory Visit
Admission: RE | Admit: 2019-08-01 | Discharge: 2019-08-01 | Disposition: A | Discharge status: Home / Self Care | Payer: Medicaid Other | Attending: Nurse Practitioner | Admitting: Nurse Practitioner

## 2019-08-01 DIAGNOSIS — I2510 Atherosclerotic heart disease of native coronary artery without angina pectoris: Secondary | ICD-10-CM | POA: Insufficient documentation

## 2019-08-01 LAB — BASIC METABOLIC PANEL
Anion gap: 11 (ref 5–15)
BUN: 15 mg/dL (ref 8–23)
CO2: 27 mmol/L (ref 22–32)
Calcium: 9.4 mg/dL (ref 8.9–10.3)
Chloride: 100 mmol/L (ref 98–111)
Creatinine, Ser: 0.96 mg/dL (ref 0.61–1.24)
GFR calc Af Amer: >60 mL/min (ref >60)
GFR calc non Af Amer: >60 mL/min (ref >60)
Glucose, Bld: 117 mg/dL — ABNORMAL HIGH (ref 70–99)
Potassium: 3.9 mmol/L (ref 3.5–5.1)
Sodium: 138 mmol/L (ref 135–145)

## 2019-08-08 ENCOUNTER — Ambulatory Visit: Payer: Medicaid Other | Admitting: Gerontology

## 2019-08-08 ENCOUNTER — Encounter: Payer: Self-pay | Admitting: Gerontology

## 2019-08-08 VITALS — BP 128/85 | HR 79 | Temp 97.4°F | Ht 69.0 in | Wt 269.0 lb

## 2019-08-08 DIAGNOSIS — R52 Pain, unspecified: Secondary | ICD-10-CM | POA: Insufficient documentation

## 2019-08-08 DIAGNOSIS — J3489 Other specified disorders of nose and nasal sinuses: Secondary | ICD-10-CM

## 2019-08-08 DIAGNOSIS — I10 Essential (primary) hypertension: Secondary | ICD-10-CM

## 2019-08-08 DIAGNOSIS — R059 Cough, unspecified: Secondary | ICD-10-CM

## 2019-08-08 MED ORDER — GUAIFENESIN 100 MG/5ML PO SYRP: 100.0000 mg | ORAL_SOLUTION | Freq: Three times a day (TID) | ORAL | 0 refills | Status: DC | PRN

## 2019-08-08 MED ORDER — FLUTICASONE PROPIONATE 50 MCG/ACT NA SUSP: NASAL | 0 refills | Status: DC

## 2019-08-08 MED ORDER — BENZONATATE 100 MG PO CAPS: 100.0000 mg | ORAL_CAPSULE | Freq: Two times a day (BID) | ORAL | 0 refills | Status: DC | PRN

## 2019-08-08 NOTE — Progress Notes (Signed)
Established Patient Office Visit  Subjective:  Patient ID: Jason Wiggins., male    DOB: [birth year]  Age: 64 y.o. MRN: [id number]  CC:  Chief Complaint  Patient presents with   Sore Throat    has had both Covid vaccines, ongoing for about 3 days   Headache   Generalized Body Aches    HPI Jason Wiggins. is a 64 y.o male who presents for follow up of essential hypertension. He was prescribed [place] 25 mg but states that he is currently not taking it  because it gives him headaches. He did not bring his medications with him to his appoint thus, medication reconciliation was not completed. He complained of  nasal congestion, sore throat and generalized body aches for the past 3 days. He also verbalized coughing up yellowish phlegm. He states that he hasn't taken any medication. He  continues to use 2 L of oxygen via nasal cannula at night as needed. He denies any sick contact, fever and chills. Overall, he states that he is concerned about his cough and denies any further complaint.       Past Medical History:  Diagnosis Date   Alcohol abuse    possibly as much as 24 beers per day,in past   Arthritis    COVID-19 virus infection 02/2019   History of echocardiogram    a. 10/2018 Echo: EF 55-60%, borderline LVH. Diast dysfxn. No significant valvular dzs.   HTN (hypertension)    Nonobstructive CAD (coronary artery disease)    a. 10/2016 Cath: LM nl, LAD 34m (FFR nl @ 0.83), LCX nl, RCA nl, EF nl; b. 10/2018 MV: no ischemia/infarct. EF 55-60%. Low risk.    Past Surgical History:  Procedure Laterality Date   CARDIAC CATHETERIZATION     HERNIA REPAIR     Right inguinal   INTRAVASCULAR PRESSURE WIRE/FFR STUDY N/A 10/28/2016   Procedure: INTRAVASCULAR PRESSURE WIRE/FFR STUDY;  Surgeon: Marykay Lex, MD;  Location: [facility] INVASIVE CV LAB;  Service: Cardiovascular;  Laterality: N/A;   LEFT HEART CATH AND CORONARY ANGIOGRAPHY N/A 10/28/2016   Procedure: LEFT HEART CATH AND  CORONARY ANGIOGRAPHY;  Surgeon: Marykay Lex, MD;  Location: [facility] INVASIVE CV LAB;  Service: Cardiovascular;  Laterality: N/A;    Family History  Problem Relation Age of Onset   Cancer Mother    Cancer Father     Social History   Socioeconomic History   Marital status: Legally Separated    Spouse name: Not on file   Number of children: 2   Years of education: Not on file   Highest education level: Not on file  Occupational History   Not on file  Tobacco Use   Smoking status: Never Smoker   Smokeless tobacco: Never Used  Vaping Use   Vaping Use: Never used  Substance and Sexual Activity   Alcohol use: Yes    Comment: daily, 4 - 5 beers per day   Drug use: No   Sexual activity: Yes    Birth control/protection: Condom  Other Topics Concern   Not on file  Social History Narrative   Lives with sister.  Lost his job.     Social Determinants of Health   Financial Resource Strain: High Risk   Difficulty of Paying Living Expenses: Very hard  Food Insecurity: Food Insecurity Present   Worried About Programme researcher, broadcasting/film/video in the Last Year: Often true   Barista in the Last Year: Often true  Transportation  Needs: No Transportation Needs   Lack of Transportation (Medical): No   Lack of Transportation (Non-Medical): No  Physical Activity: Inactive   Days of Exercise per Week: 0 days   Minutes of Exercise per Session: 0 min  Stress: Stress Concern Present   Feeling of Stress : Very much  Social Connections: Socially Isolated   Frequency of Communication with Friends and Family: More than three times a week   Frequency of Social Gatherings with Friends and Family: Three times a week   Attends Religious Services: Never   Active Member of Clubs or Organizations: No   Attends Banker Meetings: Never   Marital Status: Separated  Intimate Partner Violence: Not At Risk   Fear of Current or Ex-Partner: No   Emotionally Abused: No    Physically Abused: No   Sexually Abused: No    Outpatient Medications Prior to Visit  Medication Sig Dispense Refill   albuterol (VENTOLIN HFA) 108 (90 Base) MCG/ACT inhaler Inhale 2 puffs into the lungs every 6 (six) hours as needed for wheezing or shortness of breath. 6.7 g 2   aspirin EC 81 MG tablet Take 1 tablet (81 mg total) by mouth daily. 30 tablet 2   potassium chloride SA (KLOR-CON) 20 MEQ tablet Take 2 tablets (40 mEq total) by mouth 2 (two) times daily. 180 tablet 3   pravastatin (PRAVACHOL) 20 MG tablet Take 1 tablet (20 mg total) by mouth daily. 30 tablet 2   budesonide-formoterol (SYMBICORT) 160-4.5 MCG/ACT inhaler Inhale 2 puffs into the lungs every 12 (twelve) hours. 1 Inhaler 5   chlorthalidone (HYGROTON) 25 MG tablet Take 1 tablet (25 mg total) by mouth daily. (Patient not taking: Reported on 08/08/2019) 90 tablet 3   COMBIVENT RESPIMAT 20-100 MCG/ACT AERS respimat INHALE 1 PUFF EVERY 6 HOURS 12 g 0   nitroGLYCERIN (NITROSTAT) 0.4 MG SL tablet Place 1 tablet (0.4 mg total) under the tongue every 5 (five) minutes as needed for chest pain. 30 tablet 1   benzonatate (TESSALON PERLES) 100 MG capsule Take 2 capsules (200 mg total) by mouth 3 (three) times daily as needed for cough. 20 capsule 0   fluticasone (FLONASE) 50 MCG/ACT nasal spray PLACE ONE SPRAY INTO BOTH NOSTRILS EVERY DAY (Patient not taking: Reported on 08/08/2019) 16 g 0   No facility-administered medications prior to visit.    No Known Allergies  ROS Review of Systems  Constitutional: Negative.   HENT: Positive for congestion (.Nasal congestion ).   Respiratory: Negative.   Cardiovascular: Negative.   Gastrointestinal: Negative.   Endocrine: Negative.   Genitourinary: Negative.   Musculoskeletal: Positive for myalgias (.Generalized body aches ).  Neurological: Negative.   Psychiatric/Behavioral: Negative.       Objective:    Physical Exam Constitutional:      Appearance: He is  well-developed.  HENT:     Head: Normocephalic and atraumatic.  Cardiovascular:     Rate and Rhythm: Normal rate and regular rhythm.     Heart sounds: Normal heart sounds.  Pulmonary:     Breath sounds: Examination of the right-lower field reveals decreased breath sounds. Examination of the left-lower field reveals decreased breath sounds. Decreased breath sounds present.  Neurological:     General: No focal deficit present.     Mental Status: He is alert and oriented to person, place, and time.  Psychiatric:        Mood and Affect: Mood normal.        Behavior: Behavior normal.  BP 128/85 (BP Location: Right Arm, Patient Position: Sitting)    Pulse 79    Temp (!) 97.4 F (36.3 C)    Ht 5\' 9"  (1.753 m)    Wt (!) 269 lb (122 kg)    SpO2 96%    BMI 39.72 kg/m  Wt Readings from Last 3 Encounters:  08/08/19 (!) 269 lb (122 kg)  06/20/19 269 lb (122 kg)  06/13/19 267 lb (121.1 kg)   He was advised to continue with current weight loss regimen   Health Maintenance Due  Topic Date Due   Hepatitis C Screening  Never done   TETANUS/TDAP  Never done    There are no preventive care reminders to display for this patient.  Lab Results  Component Value Date   TSH 1.733 04/26/2018   Lab Results  Component Value Date   WBC 4.3 05/24/2019   HGB 14.3 05/24/2019   HCT 39.3 05/24/2019   MCV 94.5 05/24/2019   PLT 159 05/24/2019   Lab Results  Component Value Date   NA 138 08/01/2019   K 3.9 08/01/2019   CO2 27 08/01/2019   GLUCOSE 117 (H) 08/01/2019   BUN 15 08/01/2019   CREATININE 0.96 08/01/2019   BILITOT 0.4 09/19/2018   ALKPHOS 73 09/19/2018   AST 39 09/19/2018   ALT 55 (H) 09/19/2018   PROT 6.3 09/19/2018   ALBUMIN 4.3 09/19/2018   CALCIUM 9.4 08/01/2019   ANIONGAP 11 08/01/2019   Lab Results  Component Value Date   CHOL 195 02/13/2019   Lab Results  Component Value Date   HDL 43 02/13/2019   Lab Results  Component Value Date   LDLCALC 85 02/13/2019    Lab Results  Component Value Date   TRIG 412 (H) 02/13/2019   Lab Results  Component Value Date   CHOLHDL 4.5 02/13/2019   Lab Results  Component Value Date   HGBA1C 5.4 08/22/2018      Assessment & Plan:   1. Essential hypertension His blood pressure is under control. He states that he stopped taking his chlorthalidone due to headaches. He was advised to continue DASH diet and exercise as tolerated. His potassium 40 mEq bid was discontinued since he stopped taking his chlorthalidone. His potassium will be rechecked next week, and his Cardiology team [facility]. C NP was aware.     2. Cough He will continue current treatment regimen and was advised to cover his cover - benzonatate (TESSALON PERLES) 100 MG capsule; Take 1 capsule (100 mg total) by mouth 2 (two) times daily as needed for cough.  Dispense: 20 capsule; Refill: 0 - guaifenesin (ROBITUSSIN) 100 MG/5ML syrup; Take 5 mLs (100 mg total) by mouth 3 (three) times daily as needed for cough.  Dispense: 240 mL; Refill: 0  3. Generalized body aches He was advised to completed the COVID-19 test   4. Rhinorrhea He will continue current treatment regimen - fluticasone (FLONASE) 50 MCG/ACT nasal spray; PLACE ONE SPRAY INTO BOTH NOSTRILS EVERY DAY  Dispense: 16 g; Refill: 0    Follow-up: Return in about 5 weeks (around 09/12/2019), or if symptoms worsen or fail to improve.    11/12/2019, RN

## 2019-08-14 ENCOUNTER — Ambulatory Visit: Payer: Medicaid Other | Admitting: Gerontology

## 2019-08-26 ENCOUNTER — Ambulatory Visit: Payer: Self-pay

## 2019-08-26 ENCOUNTER — Other Ambulatory Visit: Payer: Self-pay

## 2019-08-26 DIAGNOSIS — G4719 Other hypersomnia: Secondary | ICD-10-CM

## 2019-08-26 DIAGNOSIS — G4733 Obstructive sleep apnea (adult) (pediatric): Secondary | ICD-10-CM

## 2019-08-28 DIAGNOSIS — G4733 Obstructive sleep apnea (adult) (pediatric): Secondary | ICD-10-CM

## 2019-08-29 ENCOUNTER — Telehealth: Payer: Self-pay | Admitting: Pulmonary Disease

## 2019-08-29 DIAGNOSIS — G4730 Sleep apnea, unspecified: Secondary | ICD-10-CM

## 2019-08-29 NOTE — Telephone Encounter (Signed)
Both patient and patient's sister, Beatrice(DPR) are aware of results.  Order has been placed for cpap titration, as patient is agreeable.  Nothing further is needed at this time.

## 2019-08-29 NOTE — Telephone Encounter (Signed)
You could start him on auto CPAP.  However, given the severity of his OSA and presence of pulmonary fibrosis, he would be better served having an in lab CPAP titration study since he might need supplemental oxygen in addition to CPAP therapy.

## 2019-08-29 NOTE — Telephone Encounter (Signed)
Home sleep study from 08/26/19 showed severe obstructive sleep apnea with an AHI of 42.1 and SpO2 low of 67%.   Will route message to Dr. Jayme Cloud to follow up with patient.

## 2019-08-29 NOTE — Telephone Encounter (Signed)
I believe patient has Medicaid and will need an in lab CPAP titration study.

## 2019-08-29 NOTE — Telephone Encounter (Signed)
Please order titration study.  Diagnosis: Severe obstructive sleep apnea.

## 2019-09-12 ENCOUNTER — Other Ambulatory Visit: Payer: Self-pay

## 2019-09-12 ENCOUNTER — Ambulatory Visit: Payer: Medicaid Other | Admitting: Gerontology

## 2019-09-12 ENCOUNTER — Encounter: Payer: Self-pay | Admitting: Gerontology

## 2019-09-12 VITALS — BP 125/83 | HR 96 | Ht 69.0 in | Wt 261.8 lb

## 2019-09-12 DIAGNOSIS — R059 Cough, unspecified: Secondary | ICD-10-CM

## 2019-09-12 DIAGNOSIS — R21 Rash and other nonspecific skin eruption: Secondary | ICD-10-CM

## 2019-09-12 DIAGNOSIS — R0602 Shortness of breath: Secondary | ICD-10-CM

## 2019-09-12 MED ORDER — COMBIVENT RESPIMAT 20-100 MCG/ACT IN AERS: INHALATION_SPRAY | RESPIRATORY_TRACT | 3 refills | Status: DC

## 2019-09-12 MED ORDER — ALBUTEROL SULFATE HFA 108 (90 BASE) MCG/ACT IN AERS: 2.0000 | INHALATION_SPRAY | Freq: Four times a day (QID) | RESPIRATORY_TRACT | 2 refills | Status: DC | PRN

## 2019-09-12 MED ORDER — TRIAMCINOLONE ACETONIDE 0.1 % EX CREA: 1.0000 "application " | TOPICAL_CREAM | Freq: Two times a day (BID) | CUTANEOUS | 0 refills | Status: DC

## 2019-09-12 MED ORDER — BENZONATATE 100 MG PO CAPS: 100.0000 mg | ORAL_CAPSULE | Freq: Three times a day (TID) | ORAL | 0 refills | Status: DC | PRN

## 2019-09-12 NOTE — Patient Instructions (Signed)

## 2019-09-12 NOTE — Progress Notes (Signed)
Established Patient Office Visit  Subjective:  Patient ID: Jason Wiggins., male    DOB: 19-Jul-1955  Age: 64 y.o. MRN: [id number]  CC: No chief complaint on file.   HPI Jason Wiggins. presents for follow up of cough.  He states that he continues to experience intermittent productive cough with yellowish to greenish phlegm. He states that cough wakes him up at night. He continues to have shortness of breath with exertion such as walking to his car. He also states that he is not physically active. He uses 2 L oxygen via [place] at night. He denies fever, chills, and any sick contacts. He had sleep study done on 08/26/2019 and an order for CPAP titration for Severe obstructive sleep apnea was done on 08/29/2019. Currently, he c/o chronic pruritic erythematous warty papules to medial surface of right knee that has been going on for 3 weeks. He denies contact with poison oak, change in lotion or detergent. Overall, he states that he's doing well and offers no further complaint.  Past Medical History:  Diagnosis Date  . Alcohol abuse    possibly as much as 24 beers per day,in past  . Arthritis   . COVID-19 virus infection 02/2019  . History of echocardiogram    a. 10/2018 Echo: EF 55-60%, borderline LVH. Diast dysfxn. No significant valvular dzs.  Marland Kitchen HTN (hypertension)   . Nonobstructive CAD (coronary artery disease)    a. 10/2016 Cath: LM nl, LAD 59m (FFR nl @ 0.83), LCX nl, RCA nl, EF nl; b. 10/2018 MV: no ischemia/infarct. EF 55-60%. Low risk.    Past Surgical History:  Procedure Laterality Date  . CARDIAC CATHETERIZATION    . HERNIA REPAIR     Right inguinal  . INTRAVASCULAR PRESSURE WIRE/FFR STUDY N/A 10/28/2016   Procedure: INTRAVASCULAR PRESSURE WIRE/FFR STUDY;  Surgeon: Marykay Lex, MD;  Location: [facility] INVASIVE CV LAB;  Service: Cardiovascular;  Laterality: N/A;  . LEFT HEART CATH AND CORONARY ANGIOGRAPHY N/A 10/28/2016   Procedure: LEFT HEART CATH AND CORONARY ANGIOGRAPHY;  Surgeon:  Marykay Lex, MD;  Location: [facility] INVASIVE CV LAB;  Service: Cardiovascular;  Laterality: N/A;    Family History  Problem Relation Age of Onset  . Cancer Mother   . Cancer Father     Social History   Socioeconomic History  . Marital status: Legally Separated    Spouse name: Not on file  . Number of children: 2  . Years of education: Not on file  . Highest education level: Not on file  Occupational History  . Not on file  Tobacco Use  . Smoking status: Never Smoker  . Smokeless tobacco: Never Used  Vaping Use  . Vaping Use: Never used  Substance and Sexual Activity  . Alcohol use: Yes    Comment: 6 drinks / week   . Drug use: No  . Sexual activity: Yes    Birth control/protection: Condom  Other Topics Concern  . Not on file  Social History Narrative   Lives with sister.  Lost his job.     Social Determinants of Health   Financial Resource Strain: High Risk  . Difficulty of Paying Living Expenses: Very hard  Food Insecurity: Food Insecurity Present  . Worried About Programme researcher, broadcasting/film/video in the Last Year: Often true  . Ran Out of Food in the Last Year: Often true  Transportation Needs: No Transportation Needs  . Lack of Transportation (Medical): No  . Lack of Transportation (Non-Medical): No  Physical Activity: Inactive  . Days of Exercise per Week: 0 days  . Minutes of Exercise per Session: 0 min  Stress: Stress Concern Present  . Feeling of Stress : Very much  Social Connections: Socially Isolated  . Frequency of Communication with Friends and Family: More than three times a week  . Frequency of Social Gatherings with Friends and Family: Once a week  . Attends Religious Services: Never  . Active Member of Clubs or Organizations: No  . Attends Banker Meetings: Never  . Marital Status: Separated  Intimate Partner Violence: Not At Risk  . Fear of Current or Ex-Partner: No  . Emotionally Abused: No  . Physically Abused: No  . Sexually Abused: No     Outpatient Medications Prior to Visit  Medication Sig Dispense Refill  . chlorthalidone (HYGROTON) 25 MG tablet Take 1 tablet (25 mg total) by mouth daily. 90 tablet 3  . fluticasone (FLONASE) 50 MCG/ACT nasal spray PLACE ONE SPRAY INTO BOTH NOSTRILS EVERY DAY 16 g 0  . albuterol (VENTOLIN HFA) 108 (90 Base) MCG/ACT inhaler Inhale 2 puffs into the lungs every 6 (six) hours as needed for wheezing or shortness of breath. 6.7 g 2  . nitroGLYCERIN (NITROSTAT) 0.4 MG SL tablet Place 1 tablet (0.4 mg total) under the tongue every 5 (five) minutes as needed for chest pain. (Patient not taking: Reported on 09/12/2019) 30 tablet 1  . pravastatin (PRAVACHOL) 20 MG tablet Take 1 tablet (20 mg total) by mouth daily. (Patient not taking: Reported on 09/12/2019) 30 tablet 2  . benzonatate (TESSALON PERLES) 100 MG capsule Take 1 capsule (100 mg total) by mouth 2 (two) times daily as needed for cough. (Patient not taking: Reported on 09/12/2019) 20 capsule 0  . budesonide-formoterol (SYMBICORT) 160-4.5 MCG/ACT inhaler Inhale 2 puffs into the lungs every 12 (twelve) hours. (Patient not taking: Reported on 09/12/2019) 1 Inhaler 5  . COMBIVENT RESPIMAT 20-100 MCG/ACT AERS respimat INHALE 1 PUFF EVERY 6 HOURS (Patient not taking: Reported on 09/12/2019) 12 g 0  . guaifenesin (ROBITUSSIN) 100 MG/5ML syrup Take 5 mLs (100 mg total) by mouth 3 (three) times daily as needed for cough. (Patient not taking: Reported on 09/12/2019) 240 mL 0   No facility-administered medications prior to visit.    No Known Allergies  ROS Review of Systems  Constitutional: Negative.   HENT: Negative for sinus pressure, sinus pain and sore throat.   Respiratory: Positive for cough and shortness of breath (intermittent chronic shortness of breath).   Cardiovascular: Negative.   Skin: Positive for rash (erythematous rash to medial surface of right knee.).  Neurological: Negative.   Psychiatric/Behavioral: Negative.       Objective:     Physical Exam HENT:     Head: Normocephalic and atraumatic.  Cardiovascular:     Rate and Rhythm: Normal rate and regular rhythm.     Pulses: Normal pulses.     Heart sounds: Normal heart sounds.  Pulmonary:     Effort: Pulmonary effort is normal.     Breath sounds: Normal breath sounds.  Skin:    Findings: Rash (erythematous round papules to medial surface of right knee.) present.  Neurological:     General: No focal deficit present.     Mental Status: He is alert and oriented to person, place, and time. Mental status is at baseline.  Psychiatric:        Mood and Affect: Mood normal.        Behavior: Behavior  normal.        Thought Content: Thought content normal.        Judgment: Judgment normal.     BP 125/83 (BP Location: Left Arm, Patient Position: Sitting)   Pulse 96   Ht 5\' 9"  (1.753 m)   Wt 261 lb 12.8 oz (118.8 kg)   SpO2 93%   BMI 38.66 kg/m  Wt Readings from Last 3 Encounters:  09/12/19 261 lb 12.8 oz (118.8 kg)  08/08/19 (!) 269 lb (122 kg)  06/20/19 269 lb (122 kg)   He lost 8 pounds in 5 weeks, he was encouraged to continue on his weight loss regimen.  Health Maintenance Due  Topic Date Due  . Hepatitis C Screening  Never done  . TETANUS/TDAP  Never done  . INFLUENZA VACCINE  Never done    There are no preventive care reminders to display for this patient.  Lab Results  Component Value Date   TSH 1.733 04/26/2018   Lab Results  Component Value Date   WBC 4.3 05/24/2019   HGB 14.3 05/24/2019   HCT 39.3 05/24/2019   MCV 94.5 05/24/2019   PLT 159 05/24/2019   Lab Results  Component Value Date   NA 138 08/01/2019   K 3.9 08/01/2019   CO2 27 08/01/2019   GLUCOSE 117 (H) 08/01/2019   BUN 15 08/01/2019   CREATININE 0.96 08/01/2019   BILITOT 0.4 09/19/2018   ALKPHOS 73 09/19/2018   AST 39 09/19/2018   ALT 55 (H) 09/19/2018   PROT 6.3 09/19/2018   ALBUMIN 4.3 09/19/2018   CALCIUM 9.4 08/01/2019   ANIONGAP 11 08/01/2019   Lab Results   Component Value Date   CHOL 195 02/13/2019   Lab Results  Component Value Date   HDL 43 02/13/2019   Lab Results  Component Value Date   LDLCALC 85 02/13/2019   Lab Results  Component Value Date   TRIG 412 (H) 02/13/2019   Lab Results  Component Value Date   CHOLHDL 4.5 02/13/2019   Lab Results  Component Value Date   HGBA1C 5.4 08/22/2018      Assessment & Plan:    1. Shortness of breath - He was advised to use inhaler and notify clinic for worsening symptoms. - albuterol (VENTOLIN HFA) 108 (90 Base) MCG/ACT inhaler; Inhale 2 puffs into the lungs every 6 (six) hours as needed for wheezing or shortness of breath.  Dispense: 6.7 g; Refill: 2 - Ipratropium-Albuterol (COMBIVENT RESPIMAT) 20-100 MCG/ACT AERS respimat; INHALE 1 PUFF EVERY 6 HOURS  Dispense: 12 g; Refill: 3  2. Cough - He was advised to use Benzonatate when sleeping at night and take Muccinex while awake. Will order chest X ray if cough continues at follow up visit. - benzonatate (TESSALON PERLES) 100 MG capsule; Take 1 capsule (100 mg total) by mouth 3 (three) times daily as needed for cough.  Dispense: 20 capsule; Refill: 0  3. Rash - Ddx Atopic Eczema, he was advised to apply Triamcinolone cream and notify clinic for worsening symptoms. - triamcinolone cream (KENALOG) 0.1 %; Apply 1 application topically 2 (two) times daily.  Dispense: 30 g; Refill: 0    Follow-up: Return in about 3 months (around 12/12/2019), or if symptoms worsen or fail to improve.    Khrista Braun 14/02/2019, NP

## 2019-09-19 ENCOUNTER — Telehealth: Payer: Self-pay | Admitting: Pharmacist

## 2019-09-19 IMAGING — CR DG CHEST 2V
2 series · 2 of 2 positions shown · non-contrast
Comparison: None.

CLINICAL DATA: Chest pain

EXAM:
CHEST  2 VIEW

[chest pa]
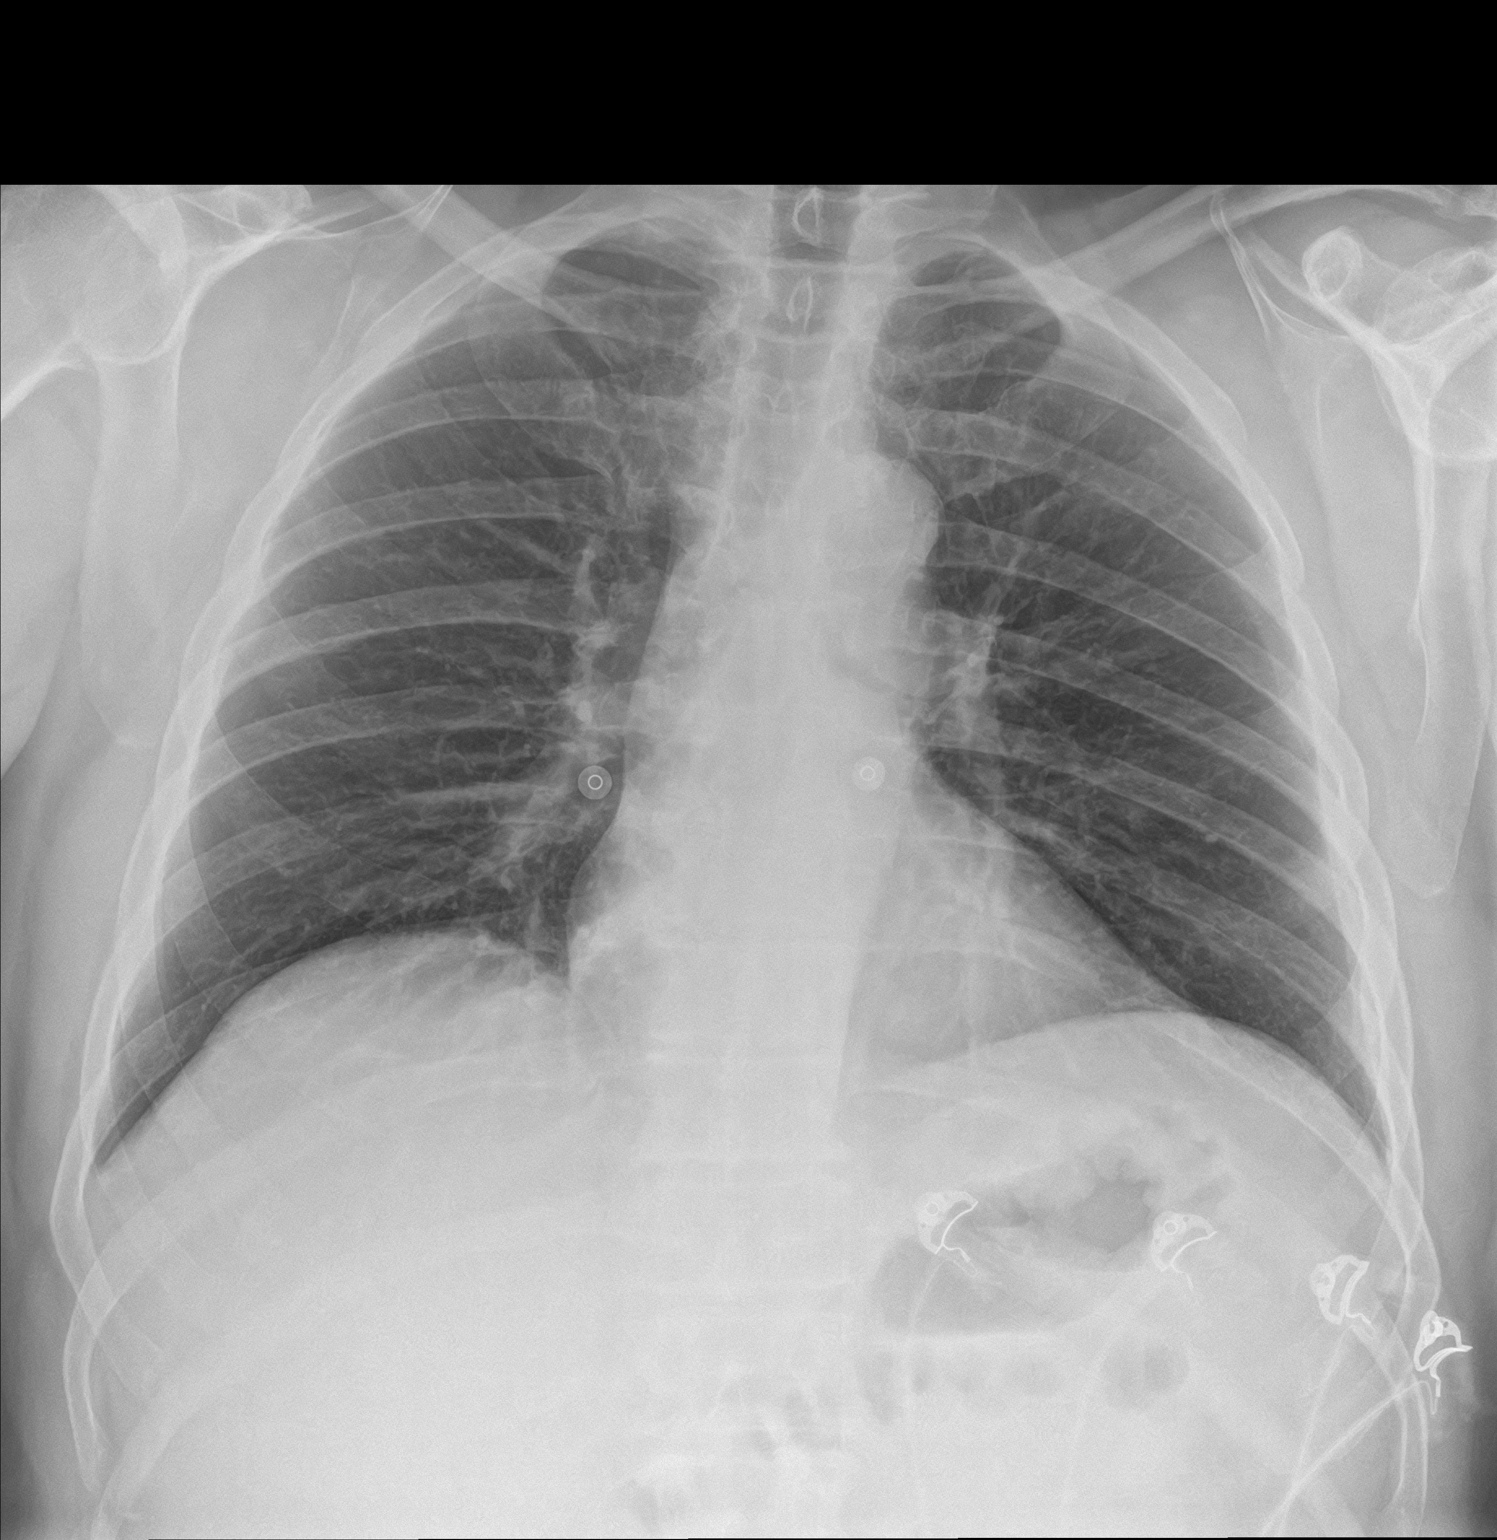

[chest lat]
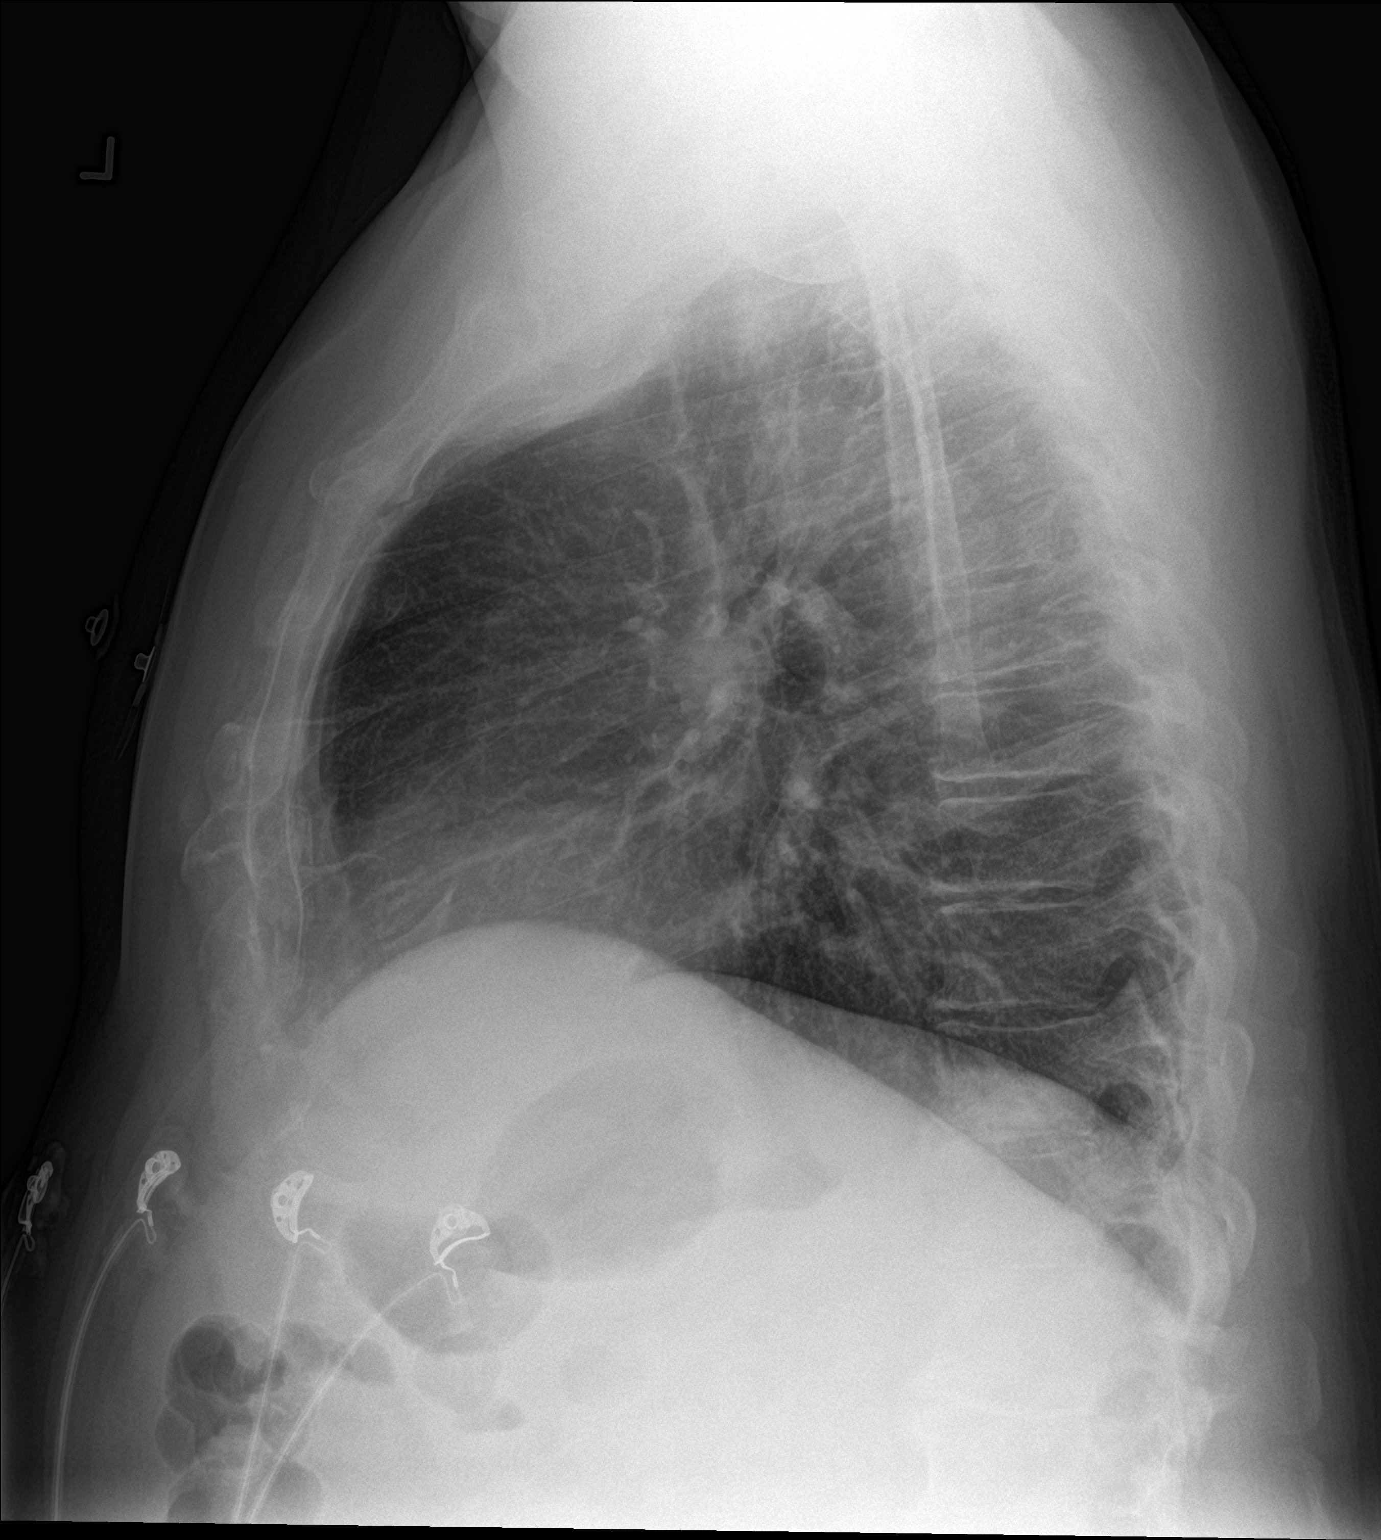

[2 of 2 positions shown; findings below may reference images not displayed]

FINDINGS: The heart size and mediastinal contours are within normal limits.
Both lungs are clear. The visualized skeletal structures are
unremarkable.
IMPRESSION: No active cardiopulmonary disease.

## 2019-09-19 NOTE — Telephone Encounter (Signed)
09/19/2019 11:05:06 AM - ProAir to pt & provider to sign  -- Rhetta Mura - Thursday, September 19, 2019 11:03 AM --Received pharmacy printout for New Med-ProAir HFA inhaler-Inhale 2 puffs by mouth every 6 hours as needed for wheezing/shortness of breath # 3. Printed [employer] application-put provider portion in [facility] folder for [place] to sign, and put patient portion in bag with meds for patient to sign & date.

## 2019-09-23 ENCOUNTER — Ambulatory Visit (INDEPENDENT_AMBULATORY_CARE_PROVIDER_SITE_OTHER): Payer: Self-pay | Admitting: Cardiology

## 2019-09-23 ENCOUNTER — Other Ambulatory Visit: Payer: Self-pay

## 2019-09-23 ENCOUNTER — Encounter: Payer: Self-pay | Admitting: Cardiology

## 2019-09-23 VITALS — BP 148/98 | HR 76 | Ht 69.0 in | Wt 263.5 lb

## 2019-09-23 DIAGNOSIS — R0683 Snoring: Secondary | ICD-10-CM

## 2019-09-23 DIAGNOSIS — E7800 Pure hypercholesterolemia, unspecified: Secondary | ICD-10-CM

## 2019-09-23 DIAGNOSIS — I2510 Atherosclerotic heart disease of native coronary artery without angina pectoris: Secondary | ICD-10-CM

## 2019-09-23 DIAGNOSIS — I10 Essential (primary) hypertension: Secondary | ICD-10-CM

## 2019-09-23 MED ORDER — CLOPIDOGREL BISULFATE 75 MG PO TABS
75.0000 mg | ORAL_TABLET | Freq: Every day | ORAL | 5 refills | Status: DC
Start: 2019-09-23 — End: 2020-01-29

## 2019-09-23 NOTE — Patient Instructions (Signed)
Medication Instructions:  Your physician has recommended you make the following change in your medication:   1.  STOP taking your Aspirin. 2.  START clopidogrel (PLAVIX) 75 MG tablet: Take 1 tablet (75 mg total) by mouth daily.  *If you need a refill on your cardiac medications before your next appointment, please call your pharmacy*   Lab Work: None Ordered If you have labs (blood work) drawn today and your tests are completely normal, you will receive your results only by: Marland Kitchen MyChart Message (if you have MyChart) OR . A paper copy in the mail If you have any lab test that is abnormal or we need to change your treatment, we will call you to review the results.   Testing/Procedures: None Ordered   Follow-Up: At [facility], you and your health needs are our priority.  As part of our continuing mission to provide you with exceptional heart care, we have created designated Provider Care Teams.  These Care Teams include your primary Cardiologist (physician) and Advanced Practice Providers (APPs -  Physician Assistants and Nurse Practitioners) who all work together to provide you with the care you need, when you need it.  We recommend signing up for the patient portal called "MyChart".  Sign up information is provided on this After Visit Summary.  MyChart is used to connect with patients for Virtual Visits (Telemedicine).  Patients are able to view lab/test results, encounter notes, upcoming appointments, etc.  Non-urgent messages can be sent to your provider as well.   To learn more about what you can do with MyChart, go to [email or web address].    Your next appointment:   1 month(s)  The format for your next appointment:   In Person  Provider:   Debbe Odea, MD ONLY   Other Instructions

## 2019-09-23 NOTE — Progress Notes (Signed)
Cardiology Office Note:    Date:  09/23/2019   ID:  Jason Cove., DOB 1955/10/15, MRN [id number]  PCP:  Rolm Gala, NP  [facility] Cardiologist:  Debbe Odea, MD  [facility] Electrophysiologist:  None   Referring MD: Rolm Gala, NP   Chief Complaint  Patient presents with  . OTHER    3 month f/u pt hasn't taken BP medication in 2 days awaiting medication. Meds reviewed verbally with pt.    History of Present Illness:    Jason Kiss. is a 64 y.o. 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  Patient states having symptoms of fatigue, shortness of breath with exertion for several months now.  Also endorses snoring, daytime somnolence.  States was about to fall asleep prior to me coming into the room.  He is scheduled for sleep study in 2 days.  He has also had dry cough since 2019.  Was diagnosed with a pneumonia in 2019, and had COVID-19 earlier this year.  Denies angina, has chest pain only when he coughs.  He is seeing pulmonary medicine regarding possible bronchitis.   Past Medical History:  Diagnosis Date  . Alcohol abuse    possibly as much as 24 beers per day,in past  . Arthritis   . COVID-19 virus infection 02/2019  . History of echocardiogram    a. 10/2018 Echo: EF 55-60%, borderline LVH. Diast dysfxn. No significant valvular dzs.  Marland Kitchen HTN (hypertension)   . Nonobstructive CAD (coronary artery disease)    a. 10/2016 Cath: LM nl, LAD 82m (FFR nl @ 0.83), LCX nl, RCA nl, EF nl; b. 10/2018 MV: no ischemia/infarct. EF 55-60%. Low risk.    Past Surgical History:  Procedure Laterality Date  . CARDIAC CATHETERIZATION    . HERNIA REPAIR     Right inguinal  . INTRAVASCULAR PRESSURE WIRE/FFR STUDY N/A 10/28/2016   Procedure: INTRAVASCULAR PRESSURE WIRE/FFR STUDY;  Surgeon: Marykay Lex, MD;  Location: [facility] INVASIVE CV LAB;  Service: Cardiovascular;  Laterality: N/A;  . LEFT  HEART CATH AND CORONARY ANGIOGRAPHY N/A 10/28/2016   Procedure: LEFT HEART CATH AND CORONARY ANGIOGRAPHY;  Surgeon: Marykay Lex, MD;  Location: [facility] INVASIVE CV LAB;  Service: Cardiovascular;  Laterality: N/A;    Current Medications: Current Meds  Medication Sig  . albuterol (VENTOLIN HFA) 108 (90 Base) MCG/ACT inhaler Inhale 2 puffs into the lungs every 6 (six) hours as needed for wheezing or shortness of breath.  . benzonatate (TESSALON PERLES) 100 MG capsule Take 1 capsule (100 mg total) by mouth 3 (three) times daily as needed for cough.  . fluticasone (FLONASE) 50 MCG/ACT nasal spray PLACE ONE SPRAY INTO BOTH NOSTRILS EVERY DAY  . Ipratropium-Albuterol (COMBIVENT RESPIMAT) 20-100 MCG/ACT AERS respimat INHALE 1 PUFF EVERY 6 HOURS  . nitroGLYCERIN (NITROSTAT) 0.4 MG SL tablet Place 1 tablet (0.4 mg total) under the tongue every 5 (five) minutes as needed for chest pain.  . pravastatin (PRAVACHOL) 20 MG tablet Take 1 tablet (20 mg total) by mouth daily.  Marland Kitchen triamcinolone cream (KENALOG) 0.1 % Apply 1 application topically 2 (two) times daily.     Allergies:   Patient has no known allergies.   Social History   Socioeconomic History  . Marital status: Legally Separated    Spouse name: Not on file  . Number of children: 2  . Years of education: Not on file  . Highest education level: Not on  file  Occupational History  . Not on file  Tobacco Use  . Smoking status: Never Smoker  . Smokeless tobacco: Never Used  Vaping Use  . Vaping Use: Never used  Substance and Sexual Activity  . Alcohol use: Yes    Comment: 6 drinks / week   . Drug use: No  . Sexual activity: Yes    Birth control/protection: Condom  Other Topics Concern  . Not on file  Social History Narrative   Lives with sister.  Lost his job.     Social Determinants of Health   Financial Resource Strain: High Risk  . Difficulty of Paying Living Expenses: Very hard  Food Insecurity: Food Insecurity Present  .  Worried About Programme researcher, broadcasting/film/video in the Last Year: Often true  . Ran Out of Food in the Last Year: Often true  Transportation Needs: No Transportation Needs  . Lack of Transportation (Medical): No  . Lack of Transportation (Non-Medical): No  Physical Activity: Inactive  . Days of Exercise per Week: 0 days  . Minutes of Exercise per Session: 0 min  Stress: Stress Concern Present  . Feeling of Stress : Very much  Social Connections: Socially Isolated  . Frequency of Communication with Friends and Family: More than three times a week  . Frequency of Social Gatherings with Friends and Family: Once a week  . Attends Religious Services: Never  . Active Member of Clubs or Organizations: No  . Attends Banker Meetings: Never  . Marital Status: Separated     Family History: The patient's family history includes Cancer in his father and mother.  ROS:   Please see the history of present illness.     All other systems reviewed and are negative.  EKGs/Labs/Other Studies Reviewed:    The following studies were reviewed today:   EKG:  EKG is  ordered today.  The ekg ordered today demonstrates normal sinus rhythm  Recent Labs: 05/24/2019: Hemoglobin 14.3; Platelets 159 08/01/2019: BUN 15; Creatinine, Ser 0.96; Potassium 3.9; Sodium 138  Recent Lipid Panel    Component Value Date/Time   CHOL 195 02/13/2019 1147   TRIG 412 (H) 02/13/2019 1147   HDL 43 02/13/2019 1147   CHOLHDL 4.5 02/13/2019 1147   CHOLHDL 3.9 04/23/2018 0448   VLDL 20 04/23/2018 0448   LDLCALC 85 02/13/2019 1147    Physical Exam:    VS:  BP (!) 148/98 (BP Location: Left Arm, Patient Position: Sitting, Cuff Size: Normal)   Pulse 76   Ht 5\' 9"  (1.753 m)   Wt 263 lb 8 oz (119.5 kg)   SpO2 98%   BMI 38.91 kg/m     Wt Readings from Last 3 Encounters:  09/23/19 263 lb 8 oz (119.5 kg)  09/12/19 261 lb 12.8 oz (118.8 kg)  08/08/19 (!) 269 lb (122 kg)     GEN:  Well nourished, well developed in no  acute distress HEENT: Normal NECK: No JVD; No carotid bruits LYMPHATICS: No lymphadenopathy CARDIAC: RRR, no murmurs, rubs, gallops RESPIRATORY:  Clear to auscultation without rales, wheezing or rhonchi  ABDOMEN: Soft, non-tender, non-distended MUSCULOSKELETAL:  No edema; No deformity  SKIN: Warm and dry NEUROLOGIC:  Alert and oriented x 3 PSYCHIATRIC:  Normal affect   ASSESSMENT:    1. Coronary artery disease involving native coronary artery of native heart without angina pectoris   2. Essential hypertension   3. Pure hypercholesterolemia   4. Snoring    PLAN:    In  order of problems listed above:  1. Patient with history of CAD, 60% mid LAD lesion.  No chest pain.  Has a history of chronic cough, unsure if he has aspirin allergies.  Start Plavix, stop aspirin.  Continue Pravachol.  If symptoms resolve we will continue with Plavix.  If symptoms persist, Plavix can be stopped and patient can continue aspirin moving forward. 2. History of hypertension, was out of BP meds for 2 days.  Continue chlorthalidone as prescribed. 3. History of hyperlipidemia, continue statin. 4. Patient snores, has symptoms concerning with sleep apnea including daytime somnolence, fatigue, dyspnea.  Encouraged to keep appointment for sleep study in 2 days.  Management as per the sleep study specialist with CPAP as needed.  Follow-up in 1 month.  Total encounter time more than 70 minutes  Greater than 50% was spent in counseling and coordination of care with the patient    Medication Adjustments/Labs and Tests Ordered: Current medicines are reviewed at length with the patient today.  Concerns regarding medicines are outlined above.  Orders Placed This Encounter  Procedures  . EKG 12-Lead   Meds ordered this encounter  Medications  . clopidogrel (PLAVIX) 75 MG tablet    Sig: Take 1 tablet (75 mg total) by mouth daily.    Dispense:  30 tablet    Refill:  5    Patient Instructions  Medication  Instructions:  Your physician has recommended you make the following change in your medication:   1.  STOP taking your Aspirin. 2.  START clopidogrel (PLAVIX) 75 MG tablet: Take 1 tablet (75 mg total) by mouth daily.  *If you need a refill on your cardiac medications before your next appointment, please call your pharmacy*   Lab Work: None Ordered If you have labs (blood work) drawn today and your tests are completely normal, you will receive your results only by: Marland Kitchen MyChart Message (if you have MyChart) OR . A paper copy in the mail If you have any lab test that is abnormal or we need to change your treatment, we will call you to review the results.   Testing/Procedures: None Ordered   Follow-Up: At [facility], you and your health needs are our priority.  As part of our continuing mission to provide you with exceptional heart care, we have created designated Provider Care Teams.  These Care Teams include your primary Cardiologist (physician) and Advanced Practice Providers (APPs -  Physician Assistants and Nurse Practitioners) who all work together to provide you with the care you need, when you need it.  We recommend signing up for the patient portal called "MyChart".  Sign up information is provided on this After Visit Summary.  MyChart is used to connect with patients for Virtual Visits (Telemedicine).  Patients are able to view lab/test results, encounter notes, upcoming appointments, etc.  Non-urgent messages can be sent to your provider as well.   To learn more about what you can do with MyChart, go to [email or web address].    Your next appointment:   1 month(s)  The format for your next appointment:   In Person  Provider:   Debbe Odea, MD ONLY   Other Instructions      Signed, Debbe Odea, MD  09/23/2019 12:33 PM    [facility]

## 2019-09-24 ENCOUNTER — Other Ambulatory Visit: Payer: Self-pay | Admitting: Gerontology

## 2019-09-25 ENCOUNTER — Telehealth: Payer: Self-pay | Admitting: Pharmacist

## 2019-09-25 ENCOUNTER — Ambulatory Visit: Payer: Medicaid Other | Attending: Pulmonary Disease

## 2019-09-25 ENCOUNTER — Encounter: Payer: Self-pay | Admitting: Pulmonary Disease

## 2019-09-25 DIAGNOSIS — G4733 Obstructive sleep apnea (adult) (pediatric): Secondary | ICD-10-CM | POA: Insufficient documentation

## 2019-09-25 NOTE — Telephone Encounter (Signed)
09/25/2019 9:13:25 AM - Combivent Respimat renewal to pat & dr  -- Rhetta Mura - Wednesday, September 25, 2019 9:12 AM --Jeanene Erb [employer] for refill on Combivent Respimat-per Darl Pikes patient needs to renew-printed application will send to [facility] for Lanora Manis to sign, also put patient portion in bag with other meds on the wall.

## 2019-09-26 ENCOUNTER — Other Ambulatory Visit: Payer: Self-pay

## 2019-09-27 ENCOUNTER — Telehealth (INDEPENDENT_AMBULATORY_CARE_PROVIDER_SITE_OTHER): Payer: Self-pay | Admitting: Pulmonary Disease

## 2019-09-27 ENCOUNTER — Telehealth: Payer: Self-pay | Admitting: Pharmacist

## 2019-09-27 DIAGNOSIS — G4733 Obstructive sleep apnea (adult) (pediatric): Secondary | ICD-10-CM

## 2019-09-27 NOTE — Telephone Encounter (Signed)
CPAP titration study showed CPAP 12 cm required with small full facemask. No oxygen supplementation was required Please send in prescription accordingly

## 2019-09-27 NOTE — Telephone Encounter (Signed)
Lm for patient.  

## 2019-09-27 NOTE — Telephone Encounter (Signed)
Patient will need CPAP at 12 cm H2O with small full facemask of choice.  Per Dr. Carlena Sax recommendations.  Gailen Shelter, MD [facility]

## 2019-09-27 NOTE — Telephone Encounter (Signed)
09/27/2019 2:42:03 PM - ProAir faxed to [employer]  -- Rhetta Mura - Friday, September 27, 2019 2:40 PM --Faxed [employer] application for ProAir 0.09mg  Inhale 2 puffs by mouth every 6 hours as needed for wheezing/shortness of breath #3.

## 2019-09-30 NOTE — Telephone Encounter (Signed)
ATC patient- unable to leave vm due to mailbox being full.   

## 2019-10-01 DIAGNOSIS — G4733 Obstructive sleep apnea (adult) (pediatric): Secondary | ICD-10-CM

## 2019-10-01 NOTE — Telephone Encounter (Signed)
ATC patient x3- unable to leave voicemail due to mailbox being full.  Letter has been mailed to address on file. Will close encounter per office protocol.

## 2019-10-07 NOTE — Telephone Encounter (Signed)
Patient's sister, Beatrice(DPR) is aware of results and voiced her understanding.  Order has been placed for cpap as patient is agreeable per Diamond Nickel (DPR).  Appt has been scheduled for 12/11/2019.  Nothing further is needed.

## 2019-10-07 NOTE — Addendum Note (Signed)
Addended by: Lajoyce Lauber A on: 10/07/2019 02:08 PM   Modules accepted: Orders

## 2019-10-10 ENCOUNTER — Other Ambulatory Visit: Payer: Self-pay

## 2019-10-10 DIAGNOSIS — G4733 Obstructive sleep apnea (adult) (pediatric): Secondary | ICD-10-CM

## 2019-10-14 ENCOUNTER — Telehealth: Payer: Self-pay | Admitting: Pharmacist

## 2019-10-14 NOTE — Telephone Encounter (Signed)
10/14/2019 3:06:20 PM - Combivent Respimat faxed to [facility]  -- Rhetta Mura - Monday, October 14, 2019 3:05 PM --Faxed [employer] renewal application for Combivent Respimat 100/7mcg Inhale 1 puff every 6 hours, with letter that patient signed to ship med to our office.

## 2019-10-24 ENCOUNTER — Other Ambulatory Visit: Payer: Self-pay | Admitting: Cardiology

## 2019-10-24 ENCOUNTER — Encounter: Payer: Self-pay | Admitting: Cardiology

## 2019-10-24 ENCOUNTER — Other Ambulatory Visit: Payer: Self-pay

## 2019-10-24 ENCOUNTER — Ambulatory Visit (INDEPENDENT_AMBULATORY_CARE_PROVIDER_SITE_OTHER): Payer: Self-pay | Admitting: Cardiology

## 2019-10-24 VITALS — BP 142/92 | HR 90 | Ht 69.0 in | Wt 261.0 lb

## 2019-10-24 DIAGNOSIS — I10 Essential (primary) hypertension: Secondary | ICD-10-CM

## 2019-10-24 DIAGNOSIS — E7800 Pure hypercholesterolemia, unspecified: Secondary | ICD-10-CM

## 2019-10-24 DIAGNOSIS — G4733 Obstructive sleep apnea (adult) (pediatric): Secondary | ICD-10-CM

## 2019-10-24 DIAGNOSIS — I2510 Atherosclerotic heart disease of native coronary artery without angina pectoris: Secondary | ICD-10-CM

## 2019-10-24 MED ORDER — ISOSORBIDE MONONITRATE ER 30 MG PO TB24: 30.0000 mg | ORAL_TABLET | Freq: Every day | ORAL | 3 refills | Status: DC

## 2019-10-24 MED ORDER — CHLORTHALIDONE 25 MG PO TABS: 25.0000 mg | ORAL_TABLET | Freq: Every day | ORAL | 3 refills | Status: DC

## 2019-10-24 MED ORDER — PRAVASTATIN SODIUM 40 MG PO TABS
40.0000 mg | ORAL_TABLET | Freq: Every evening | ORAL | 3 refills | Status: DC
Start: 2019-10-24 — End: 2020-01-29

## 2019-10-24 NOTE — Progress Notes (Signed)
Cardiology Office Note:    Date:  10/24/2019   ID:  Jason Cove., DOB 1955-08-17, MRN [id number]  PCP:  Rolm Gala, NP  [facility] Cardiologist:  Debbe Odea, MD  [facility] Electrophysiologist:  None   Referring MD: Rolm Gala, NP   Chief Complaint  Patient presents with  . Follow-up    1 Month follow up. Medications verbally reviewed with patient.     History of Present Illness:    Jason Mabey. is a 64 y.o. 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  He was last seen due to fatigue, chronic cough, snoring, daytime somnolence.  Symptoms were deemed likely from sleep apnea.  Aspirin was held and Plavix started to see if coughing was due to aspirin allergies.  Patient saw pulmonary medicine and CPAP was recommended but patient not able to get device due to lack of insurance even though he has a confirmation from the hospital stating his device will be covered.  He states getting some chest discomfort when he exerts himself or breathes hard.  Taking sublingual nitro sometimes helps.   Prior notes Echocardiogram 10/2018 showed normal systolic function, EF 55 to 60%, impaired relaxation. Left heart cath 10/2016 with mid LAD 60% stenosis, FFR 0.83   Past Medical History:  Diagnosis Date  . Alcohol abuse    possibly as much as 24 beers per day,in past  . Arthritis   . COVID-19 virus infection 02/2019  . History of echocardiogram    a. 10/2018 Echo: EF 55-60%, borderline LVH. Diast dysfxn. No significant valvular dzs.  Marland Kitchen HTN (hypertension)   . Nonobstructive CAD (coronary artery disease)    a. 10/2016 Cath: LM nl, LAD 53m (FFR nl @ 0.83), LCX nl, RCA nl, EF nl; b. 10/2018 MV: no ischemia/infarct. EF 55-60%. Low risk.    Past Surgical History:  Procedure Laterality Date  . CARDIAC CATHETERIZATION    . HERNIA REPAIR     Right inguinal  . INTRAVASCULAR PRESSURE WIRE/FFR STUDY  N/A 10/28/2016   Procedure: INTRAVASCULAR PRESSURE WIRE/FFR STUDY;  Surgeon: Marykay Lex, MD;  Location: [facility] INVASIVE CV LAB;  Service: Cardiovascular;  Laterality: N/A;  . LEFT HEART CATH AND CORONARY ANGIOGRAPHY N/A 10/28/2016   Procedure: LEFT HEART CATH AND CORONARY ANGIOGRAPHY;  Surgeon: Marykay Lex, MD;  Location: [facility] INVASIVE CV LAB;  Service: Cardiovascular;  Laterality: N/A;    Current Medications: Current Meds  Medication Sig  . albuterol (VENTOLIN HFA) 108 (90 Base) MCG/ACT inhaler Inhale 2 puffs into the lungs every 6 (six) hours as needed for wheezing or shortness of breath.  . clopidogrel (PLAVIX) 75 MG tablet Take 1 tablet (75 mg total) by mouth daily.  . Ipratropium-Albuterol (COMBIVENT RESPIMAT) 20-100 MCG/ACT AERS respimat INHALE 1 PUFF EVERY 6 HOURS  . nitroGLYCERIN (NITROSTAT) 0.4 MG SL tablet Place 1 tablet (0.4 mg total) under the tongue every 5 (five) minutes as needed for chest pain.  Marland Kitchen triamcinolone cream (KENALOG) 0.1 % Apply 1 application topically 2 (two) times daily.  . [DISCONTINUED] aspirin EC 81 MG tablet Take 81 mg by mouth daily. Swallow whole.     Allergies:   Patient has no known allergies.   Social History   Socioeconomic History  . Marital status: Legally Separated    Spouse name: Not on file  . Number of children: 2  . Years of education: Not on file  . Highest education level: Not on  file  Occupational History  . Not on file  Tobacco Use  . Smoking status: Never Smoker  . Smokeless tobacco: Never Used  Vaping Use  . Vaping Use: Never used  Substance and Sexual Activity  . Alcohol use: Yes    Comment: 6 drinks / week   . Drug use: No  . Sexual activity: Yes    Birth control/protection: Condom  Other Topics Concern  . Not on file  Social History Narrative   Lives with sister.  Lost his job.     Social Determinants of Health   Financial Resource Strain: High Risk  . Difficulty of Paying Living Expenses: Very hard  Food  Insecurity: Food Insecurity Present  . Worried About Programme researcher, broadcasting/film/video in the Last Year: Often true  . Ran Out of Food in the Last Year: Often true  Transportation Needs: No Transportation Needs  . Lack of Transportation (Medical): No  . Lack of Transportation (Non-Medical): No  Physical Activity: Inactive  . Days of Exercise per Week: 0 days  . Minutes of Exercise per Session: 0 min  Stress: Stress Concern Present  . Feeling of Stress : Very much  Social Connections: Socially Isolated  . Frequency of Communication with Friends and Family: More than three times a week  . Frequency of Social Gatherings with Friends and Family: Once a week  . Attends Religious Services: Never  . Active Member of Clubs or Organizations: No  . Attends Banker Meetings: Never  . Marital Status: Separated     Family History: The patient's family history includes Cancer in his father and mother.  ROS:   Please see the history of present illness.     All other systems reviewed and are negative.  EKGs/Labs/Other Studies Reviewed:    The following studies were reviewed today:   EKG:  EKG not ordered today.    Recent Labs: 05/24/2019: Hemoglobin 14.3; Platelets 159 08/01/2019: BUN 15; Creatinine, Ser 0.96; Potassium 3.9; Sodium 138  Recent Lipid Panel    Component Value Date/Time   CHOL 195 02/13/2019 1147   TRIG 412 (H) 02/13/2019 1147   HDL 43 02/13/2019 1147   CHOLHDL 4.5 02/13/2019 1147   CHOLHDL 3.9 04/23/2018 0448   VLDL 20 04/23/2018 0448   LDLCALC 85 02/13/2019 1147    Physical Exam:    VS:  BP (!) 142/92 (BP Location: Left Arm, Patient Position: Sitting, Cuff Size: Normal)   Pulse 90   Ht 5\' 9"  (1.753 m)   Wt 261 lb (118.4 kg)   SpO2 94%   BMI 38.54 kg/m     Wt Readings from Last 3 Encounters:  10/24/19 261 lb (118.4 kg)  09/23/19 263 lb 8 oz (119.5 kg)  09/12/19 261 lb 12.8 oz (118.8 kg)     GEN:  Well nourished, well developed in no acute distress HEENT:  Normal NECK: No JVD; No carotid bruits LYMPHATICS: No lymphadenopathy CARDIAC: RRR, no murmurs, rubs, gallops RESPIRATORY:  Clear to auscultation without rales, wheezing or rhonchi  ABDOMEN: Soft, non-tender, non-distended MUSCULOSKELETAL:  No edema; No deformity  SKIN: Warm and dry NEUROLOGIC:  Alert and oriented x 3 PSYCHIATRIC:  Normal affect   ASSESSMENT:    1. Coronary artery disease involving native coronary artery of native heart, unspecified whether angina present   2. Essential hypertension   3. Pure hypercholesterolemia   4. OSA (obstructive sleep apnea)    PLAN:    In order of problems listed above:  1.  Patient with history of CAD, 60% mid LAD lesion.  Occasional chest discomfort with anxiety or exertion.  Will start Imdur 30 mg daily for antianginal benefit.  Continue Plavix, Pravachol. 2. History of hypertension, BP elevated, continue chlorthalidone, start Imdur as above. 3. History of hyperlipidemia, increase Pravachol to 40 mg daily. 4. Patient with fatigue, snoring, symptoms consistent with OSA.  Keep follow-up appointment with pulmonary medicine regarding CPAP mask.  Hopefully this issue could be worked out with device company.  Follow-up in 6 month.  Total encounter time 40 minutes  Greater than 50% was spent in counseling and coordination of care with the patient    Medication Adjustments/Labs and Tests Ordered: Current medicines are reviewed at length with the patient today.  Concerns regarding medicines are outlined above.  No orders of the defined types were placed in this encounter.  Meds ordered this encounter  Medications  . chlorthalidone (HYGROTON) 25 MG tablet    Sig: Take 1 tablet (25 mg total) by mouth daily.    Dispense:  90 tablet    Refill:  3  . pravastatin (PRAVACHOL) 40 MG tablet    Sig: Take 1 tablet (40 mg total) by mouth every evening.    Dispense:  90 tablet    Refill:  3  . isosorbide mononitrate (IMDUR) 30 MG 24 hr tablet     Sig: Take 1 tablet (30 mg total) by mouth daily.    Dispense:  90 tablet    Refill:  3    Patient Instructions  Medication Instructions:  Your physician has recommended you make the following change in your medication:  1. STOP Aspirin 2. START Isosorbide mononitrate (Imdur) 30 mg once daily. 3. INCREASE Pravastatine 40 mg once daily 4. RESUME Clorthalidone   *If you need a refill on your cardiac medications before your next appointment, please call your pharmacy*   Lab Work: None If you have labs (blood work) drawn today and your tests are completely normal, you will receive your results only by: Marland Kitchen MyChart Message (if you have MyChart) OR . A paper copy in the mail If you have any lab test that is abnormal or we need to change your treatment, we will call you to review the results.   Testing/Procedures: None   Follow-Up: At [facility], you and your health needs are our priority.  As part of our continuing mission to provide you with exceptional heart care, we have created designated Provider Care Teams.  These Care Teams include your primary Cardiologist (physician) and Advanced Practice Providers (APPs -  Physician Assistants and Nurse Practitioners) who all work together to provide you with the care you need, when you need it.   Your next appointment:   6 month(s)  The format for your next appointment:   In Person  Provider:   You may see Debbe Odea, MD or one of the following Advanced Practice Providers on your designated Care Team:    Nicolasa Ducking, NP  Eula Listen, PA-C  Marisue Ivan, PA-C  Cadence Marshall, [place]     Signed, Debbe Odea, MD  10/24/2019 12:58 PM    [facility]

## 2019-10-24 NOTE — Patient Instructions (Addendum)
Medication Instructions:  Your physician has recommended you make the following change in your medication:  1. STOP Aspirin 2. START Isosorbide mononitrate (Imdur) 30 mg once daily. 3. INCREASE Pravastatine 40 mg once daily 4. RESUME Clorthalidone   *If you need a refill on your cardiac medications before your next appointment, please call your pharmacy*   Lab Work: None If you have labs (blood work) drawn today and your tests are completely normal, you will receive your results only by: Marland Kitchen MyChart Message (if you have MyChart) OR . A paper copy in the mail If you have any lab test that is abnormal or we need to change your treatment, we will call you to review the results.   Testing/Procedures: None   Follow-Up: At [facility], you and your health needs are our priority.  As part of our continuing mission to provide you with exceptional heart care, we have created designated Provider Care Teams.  These Care Teams include your primary Cardiologist (physician) and Advanced Practice Providers (APPs -  Physician Assistants and Nurse Practitioners) who all work together to provide you with the care you need, when you need it.   Your next appointment:   6 month(s)  The format for your next appointment:   In Person  Provider:   You may see Debbe Odea, MD or one of the following Advanced Practice Providers on your designated Care Team:    Nicolasa Ducking, NP  Eula Listen, PA-C  Marisue Ivan, PA-C  Cadence Roland, [place]

## 2019-12-11 ENCOUNTER — Other Ambulatory Visit: Payer: Self-pay

## 2019-12-11 ENCOUNTER — Encounter: Payer: Self-pay | Admitting: Pulmonary Disease

## 2019-12-11 ENCOUNTER — Ambulatory Visit (INDEPENDENT_AMBULATORY_CARE_PROVIDER_SITE_OTHER): Payer: Self-pay | Admitting: Pulmonary Disease

## 2019-12-11 VITALS — BP 130/90 | HR 87 | Temp 97.1°F | Ht 71.0 in | Wt 267.0 lb

## 2019-12-11 DIAGNOSIS — E662 Morbid (severe) obesity with alveolar hypoventilation: Secondary | ICD-10-CM

## 2019-12-11 DIAGNOSIS — K219 Gastro-esophageal reflux disease without esophagitis: Secondary | ICD-10-CM

## 2019-12-11 DIAGNOSIS — G4730 Sleep apnea, unspecified: Secondary | ICD-10-CM

## 2019-12-11 DIAGNOSIS — J683 Other acute and subacute respiratory conditions due to chemicals, gases, fumes and vapors: Secondary | ICD-10-CM

## 2019-12-11 DIAGNOSIS — J8410 Pulmonary fibrosis, unspecified: Secondary | ICD-10-CM

## 2019-12-11 NOTE — Progress Notes (Signed)
Subjective:    Patient ID: Jason Cove., male    DOB: Apr 14, 1955, 64 y.o.   MRN: [id number]  HPI 64 year old never smoker who follows here for the issue of multifactorial dyspnea.  He has issues with restrictive physiology due to obesity, diastolic dysfunction and mild postinflammatory pulmonary fibrosis after episode of atypical pneumonia.  Patient has a personal history of COVID-19 in February 2021, did not require hospitalization.  He has been diagnosed with severe obstructive sleep apnea and has been titrated for CPAP at 12 cm water pressure.  However, he has not been able to obtain the CPAP due to lack of insurance.  He does have some airways reactivity which is well controlled on Symbicort.  He appears to be in his usual irascible mood.  He has a chronic dry cough likely related to reflux currently he is not taking any antireflux medications as prescribed previously.  He also notes postnasal drip.  Does not offer any new complaint.  Dyspnea is stable.   Review of Systems A 10 point review of systems was performed and it is as noted above otherwise negative.  Patient Active Problem List   Diagnosis Date Noted  . Rash 09/12/2019  . Generalized body aches 08/08/2019  . Decreased breath sounds 06/13/2019  . Common cold 12/18/2018  . Cough 12/18/2018  . Essential hypertension 10/16/2018  . Contact dermatitis due to poison ivy 09/18/2018  . Chest pain on exertion 09/18/2018  . Edema of both lower extremities 08/22/2018  . Pleuritic chest pain 08/21/2018  . Encounter to establish care 08/21/2018  . History of coronary artery disease 08/21/2018  . Shortness of breath 08/21/2018  . Morbid obesity (HCC) 08/21/2018  . Sepsis (HCC) 04/26/2018  . Suspected COVID-19 virus infection 04/21/2018  . Hypoxia 03/21/2018  . Bleach ingestion 08/14/2017  . 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 Substance induced mood disorder (HCC) 08/14/2017  . Alcohol  abuse 08/14/2017  . Unstable angina (HCC) 10/22/2016  . Hyperlipidemia 10/22/2016  . Medication management 10/22/2016   No Known Allergies Current Meds  Medication Sig  . albuterol (VENTOLIN HFA) 108 (90 Base) MCG/ACT inhaler Inhale 2 puffs into the lungs every 6 (six) hours as needed for wheezing or shortness of breath.  . chlorthalidone (HYGROTON) 25 MG tablet Take 1 tablet (25 mg total) by mouth daily.  . clopidogrel (PLAVIX) 75 MG tablet Take 1 tablet (75 mg total) by mouth daily.  . Ipratropium-Albuterol (COMBIVENT RESPIMAT) 20-100 MCG/ACT AERS respimat INHALE 1 PUFF EVERY 6 HOURS  . isosorbide mononitrate (IMDUR) 30 MG 24 hr tablet Take 1 tablet (30 mg total) by mouth daily.  . nitroGLYCERIN (NITROSTAT) 0.4 MG SL tablet Place 1 tablet (0.4 mg total) under the tongue every 5 (five) minutes as needed for chest pain.  . pravastatin (PRAVACHOL) 40 MG tablet Take 1 tablet (40 mg total) by mouth every evening.  . triamcinolone cream (KENALOG) 0.1 % Apply 1 application topically 2 (two) times daily.   Immunization History  Administered Date(s) Administered  . PFIZER SARS-COV-2 Vaccination 04/27/2019, 05/22/2019       Objective:   Physical Exam BP 130/90 (BP Location: Right Arm, Patient Position: Sitting, Cuff Size: Large)   Pulse 87   Temp (!) 97.1 F (36.2 C) (Temporal)   Ht 5\' 11"  (1.803 m)   Wt 267 lb (121.1 kg)   SpO2 96%   BMI 37.24 kg/m  GENERAL: Obese, no acute respiratory distress, poor eye contact.  Fully  ambulatory. HEAD: Normocephalic, atraumatic.  EYES: Pupils equal, round, reactive to light. No scleral icterus.  MOUTH: Nose/mouth/throat not examined due to masking requirements for COVID 19. NECK: Supple. No thyromegaly. Trachea midline. No JVD. No adenopathy. PULMONARY: Good air entry bilaterally, no adventitious sounds. CARDIOVASCULAR: S1 and S2. Regular rate and rhythm. No rubs murmurs gallops heard. GASTROINTESTINAL: Protuberant abdomen (significant truncal  obesity), otherwise benign. MUSCULOSKELETAL: No joint deformity, no clubbing, no edema.  NEUROLOGIC: Awake, alert, speech is fluent. Fully ambulatory. No gait disturbance noted. No focal deficits. SKIN: Intact,warm,dry. Motor exam no rashes. PSYCH: Mood is irascible, cantankerous.     Assessment & Plan:     ICD-10-CM   1. Severe sleep apnea  G47.30    Patient needs CPAP at 12 cm of water pressure Unable to procure due to lack of funds Has been referred to assistance however not able to obtain  2. Reactive airways dysfunction syndrome (HCC)  J68.3    This is mild and well-controlled on Symbicort  3. Postinflammatory pulmonary fibrosis (HCC)  J84.10    Mild, due to prior viral infection This should not be a progressive issue  4. 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 Gastroesophageal reflux disease, unspecified whether esophagitis present  K21.9    He should be on antireflux measures Should resume his Protonix previously prescribed   Discussion: Patient has multiple issues that appear to be relatively stable.  Recent exacerbation of cough due to postnasal drip Zyrtec over-the-counter was recommended.  Needs to continue using Symbicort for his mild reactive airways disease.  He does have severe sleep apnea and needs CPAP.  There are no available free units and the patient cannot afford out-of-pocket cost.  He states that he is to start a new job and that this will have benefits.  Hopefully he can then get his much-needed CPAP.  Weight loss has been recommended as it would help him tremendously with his dyspnea issues.  We will see him in follow-up in 3 months time he is to contact us prior to that time should any new difficulties arise.  Gailen Shelter, MD [facility]   *This note was dictated using voice recognition  software/Dragon.  Despite best efforts to proofread, errors can occur which can change the meaning.  Any change was purely unintentional.

## 2019-12-11 NOTE — Patient Instructions (Addendum)
Over-the-counter get a medication called Zyrtec (cetirizine) 10 mg you can get pharmacy brand name.  This is found on the allergy and cold section in the pharmacy.  Take 1 at bedtime I think this will help your cough greatly.  Continue using your oxygen until can obtain CPAP.   See you in follow-up in 4 months time call sooner should any new problems arise

## 2019-12-12 ENCOUNTER — Telehealth: Payer: Self-pay | Admitting: Gerontology

## 2019-12-12 ENCOUNTER — Ambulatory Visit: Payer: Medicaid Other | Admitting: Gerontology

## 2019-12-12 NOTE — Telephone Encounter (Signed)
Unable to LVM since VM box was full @1 :15 pm on 12/12/19-KW

## 2020-01-07 ENCOUNTER — Telehealth: Payer: Self-pay | Admitting: Pharmacist

## 2020-01-07 NOTE — Telephone Encounter (Signed)
01/07/2020 9:13:28 AM - Symbicort invoice dated 10/08/2019  -- Rhetta Mura - Tuesday, January 07, 2020 9:11 AM --Received [place] invoice dated 10/08/2019 for Symbicort--note on invoice " Patient never picked up med--RTS" Kayla.  I reveiwed [facility] appears patient is still on Symbicort (last picked up from [place] 08/09/2019) per provider note, discussed with [facility], he is going to message the provider.  01/07/2020 9:09:21 AM - Call to [facility] on Combivent Respimat  -- Rhetta Mura - Tuesday, January 07, 2020 9:07 AM --Leeanne Mannan spoke with [facility], according to her they shipped Combivent Respimat to patient's home on Oct. 6. Patient is due a refill now- I placed the refill, they will ship 01/08/2020 to patient's home.

## 2020-01-29 ENCOUNTER — Telehealth: Payer: Medicaid Other | Admitting: Adult Health

## 2020-01-29 ENCOUNTER — Other Ambulatory Visit
Admission: RE | Admit: 2020-01-29 | Discharge: 2020-01-29 | Disposition: A | Discharge status: Home / Self Care | Payer: Medicaid Other | Source: Ambulatory Visit | Attending: Cardiovascular Disease | Admitting: Cardiovascular Disease

## 2020-01-29 ENCOUNTER — Other Ambulatory Visit: Payer: Self-pay

## 2020-01-29 ENCOUNTER — Encounter: Payer: Self-pay | Admitting: Cardiovascular Disease

## 2020-01-29 ENCOUNTER — Ambulatory Visit (INDEPENDENT_AMBULATORY_CARE_PROVIDER_SITE_OTHER): Payer: Self-pay | Admitting: Cardiovascular Disease

## 2020-01-29 VITALS — BP 132/82 | HR 109 | Ht 73.0 in | Wt 255.0 lb

## 2020-01-29 DIAGNOSIS — I25118 Atherosclerotic heart disease of native coronary artery with other forms of angina pectoris: Secondary | ICD-10-CM

## 2020-01-29 DIAGNOSIS — R079 Chest pain, unspecified: Secondary | ICD-10-CM

## 2020-01-29 DIAGNOSIS — R0602 Shortness of breath: Secondary | ICD-10-CM

## 2020-01-29 DIAGNOSIS — E785 Hyperlipidemia, unspecified: Secondary | ICD-10-CM

## 2020-01-29 LAB — CBC WITH DIFFERENTIAL/PLATELET
Abs Immature Granulocytes: 0.05 10*3/uL (ref 0.00–0.07)
Basophils Absolute: 0.1 10*3/uL (ref 0.0–0.1)
Basophils Relative: 1 %
Eosinophils Absolute: 0 10*3/uL (ref 0.0–0.5)
Eosinophils Relative: 0 %
HCT: 44.8 % (ref 39.0–52.0)
Hemoglobin: 15.8 g/dL (ref 13.0–17.0)
Immature Granulocytes: 1 %
Lymphocytes Relative: 20 %
Lymphs Abs: 1.6 10*3/uL (ref 0.7–4.0)
MCH: 33.3 pg (ref 26.0–34.0)
MCHC: 35.3 g/dL (ref 30.0–36.0)
MCV: 94.5 fL (ref 80.0–100.0)
Monocytes Absolute: 0.3 10*3/uL (ref 0.1–1.0)
Monocytes Relative: 4 %
Neutro Abs: 6 10*3/uL (ref 1.7–7.7)
Neutrophils Relative %: 74 %
Platelets: 235 10*3/uL (ref 150–400)
RBC: 4.74 MIL/uL (ref 4.22–5.81)
RDW: 12.1 % (ref 11.5–15.5)
WBC: 8.1 10*3/uL (ref 4.0–10.5)
nRBC: 0 % (ref 0.0–0.2)

## 2020-01-29 LAB — BASIC METABOLIC PANEL
Anion gap: 12 (ref 5–15)
BUN: 18 mg/dL (ref 8–23)
CO2: 24 mmol/L (ref 22–32)
Calcium: 9.6 mg/dL (ref 8.9–10.3)
Chloride: 104 mmol/L (ref 98–111)
Creatinine, Ser: 1.02 mg/dL (ref 0.61–1.24)
GFR, Estimated: >60 mL/min (ref >60)
Glucose, Bld: 108 mg/dL — ABNORMAL HIGH (ref 70–99)
Potassium: 4.4 mmol/L (ref 3.5–5.1)
Sodium: 140 mmol/L (ref 135–145)

## 2020-01-29 LAB — TROPONIN I (HIGH SENSITIVITY): Troponin I (High Sensitivity): 13 ng/L (ref <18)

## 2020-01-29 NOTE — Progress Notes (Signed)
Cardiology Office Note   Date:  01/29/2020   ID:  Jason Cove., DOB Apr 19, 1955, MRN [id number]  PCP:  Rolm Gala, NP  Cardiologist:  Dr. Azucena Cecil  Chief Complaint  Patient presents with  . Other    Patient c.o chest pain after eating lunch - work sent him home. Patient states he had COVID last week. Meds reviewed verbally with patient.       History of Present Illness: Jason Fludd. is a 65 y.o. male who presents for the evaluation of chest pain.  He has known history of moderate one-vessel coronary artery disease with previous cardiac catheterization 2018 showing 60% mid LAD stenosis not significant by fractional flow reserve, essential hypertension, hyperlipidemia, Sleep apnea and obesity. He was most recently seen in October and at that time Imdur was added and pravastatin was increased.  He reports that he started having migraine headaches and stopped both medications. He works at [employer] and was at work today.  He had a heavy lunch that has onions in it.  Onions usually give him heartburn.  After lunch she started having substernal chest pain described as burning sensation.  It lasted for more than 30 minutes and resolved after he took 2 sublingual nitroglycerin.  No shortness of breath.  He had COVID 19 infection last week and his symptoms included sore throat, runny nose and diarrhea. He is not having chest pain at this moment and denies shortness of breath but he is mildly tachycardic.  Past Medical History:  Diagnosis Date  . Alcohol abuse    possibly as much as 24 beers per day,in past  . Arthritis   . COVID-19 virus infection 02/2019  . History of echocardiogram    a. 10/2018 Echo: EF 55-60%, borderline LVH. Diast dysfxn. No significant valvular dzs.  Marland Kitchen HTN (hypertension)   . Nonobstructive CAD (coronary artery disease)    a. 10/2016 Cath: LM nl, LAD 73m (FFR nl @ 0.83), LCX nl, RCA nl, EF nl; b. 10/2018 MV: no ischemia/infarct. EF 55-60%. Low risk.     Past Surgical History:  Procedure Laterality Date  . CARDIAC CATHETERIZATION    . HERNIA REPAIR     Right inguinal  . INTRAVASCULAR PRESSURE WIRE/FFR STUDY N/A 10/28/2016   Procedure: INTRAVASCULAR PRESSURE WIRE/FFR STUDY;  Surgeon: Marykay Lex, MD;  Location: [facility] INVASIVE CV LAB;  Service: Cardiovascular;  Laterality: N/A;  . LEFT HEART CATH AND CORONARY ANGIOGRAPHY N/A 10/28/2016   Procedure: LEFT HEART CATH AND CORONARY ANGIOGRAPHY;  Surgeon: Marykay Lex, MD;  Location: [facility] INVASIVE CV LAB;  Service: Cardiovascular;  Laterality: N/A;     Current Outpatient Medications  Medication Sig Dispense Refill  . albuterol (VENTOLIN HFA) 108 (90 Base) MCG/ACT inhaler Inhale 2 puffs into the lungs every 6 (six) hours as needed for wheezing or shortness of breath. 6.7 g 2  . aspirin EC 81 MG tablet Take 81 mg by mouth daily. Swallow whole.    . Ipratropium-Albuterol (COMBIVENT RESPIMAT) 20-100 MCG/ACT AERS respimat INHALE 1 PUFF EVERY 6 HOURS 12 g 3  . nitroGLYCERIN (NITROSTAT) 0.4 MG SL tablet Place 1 tablet (0.4 mg total) under the tongue every 5 (five) minutes as needed for chest pain. 30 tablet 1   No current facility-administered medications for this visit.    Allergies:   Patient has no known allergies.    Social History:  The patient  reports that he has never smoked. He has never used smokeless tobacco. He  reports current alcohol use. He reports that he does not use drugs.   Family History:  The patient's family history includes Cancer in his father and mother.    ROS:  Please see the history of present illness.   Otherwise, review of systems are positive for none.   All other systems are reviewed and negative.    PHYSICAL EXAM: VS:  BP 132/82 (BP Location: Left Arm, Patient Position: Sitting, Cuff Size: Normal)   Pulse (!) 109   Ht 6\' 1"  (1.854 m)   Wt 255 lb (115.7 kg)   SpO2 97%   BMI 33.64 kg/m  , BMI Body mass index is 33.64 kg/m. GEN: Well nourished, well  developed, in no acute distress  HEENT: normal  Neck: no JVD, carotid bruits, or masses Cardiac: RRR; no murmurs, rubs, or gallops,no edema  Respiratory:  clear to auscultation bilaterally, normal work of breathing GI: soft, nontender, nondistended, + BS MS: no deformity or atrophy  Skin: warm and dry, no rash Neuro:  Strength and sensation are intact Psych: euthymic mood, full affect   EKG:  EKG is ordered today. The ekg ordered today demonstrates sinus tachycardia with left anterior fascicular block and minimal LVH.   Recent Labs: 05/24/2019: Hemoglobin 14.3; Platelets 159 08/01/2019: BUN 15; Creatinine, Ser 0.96; Potassium 3.9; Sodium 138    Lipid Panel    Component Value Date/Time   CHOL 195 02/13/2019 1147   TRIG 412 (H) 02/13/2019 1147   HDL 43 02/13/2019 1147   CHOLHDL 4.5 02/13/2019 1147   CHOLHDL 3.9 04/23/2018 0448   VLDL 20 04/23/2018 0448   LDLCALC 85 02/13/2019 1147      Wt Readings from Last 3 Encounters:  01/29/20 255 lb (115.7 kg)  12/11/19 267 lb (121.1 kg)  10/24/19 261 lb (118.4 kg)       No flowsheet data found.    ASSESSMENT AND PLAN:  1.  Chest pain: Current chest pain is suggestive of GERD.  EKG does not show any ischemic changes but he is mildly tachycardic.  Given his cardiac history and given mild tachycardia and recent COVID infection, I am going to send him for stat labs including troponin, D-dimer, CBC and basic metabolic profile.  We will also obtain an echocardiogram given possibility of myocarditis with recent COVID.  2.  Coronary artery disease involving native coronary arteries: Previous cardiac catheterization in 2018 showed moderate mid LAD stenosis.  The patient did not tolerate Imdur due to headache.  Continue low-dose aspirin.  3.  Hyperlipidemia: The patient stopped taking pravastatin due to headache.  I explained to him that the headache was most likely due to Imdur and not this.  Recommend resuming treatment with a statin in  the near future.  4.  Essential hypertension: Blood pressure is controlled.  5.  Sleep apnea: Followed by pulmonary.     Disposition:   FU in 2 weeks  Signed,  2019, MD  01/29/2020 4:22 PM    [facility]

## 2020-01-29 NOTE — Patient Instructions (Signed)
Medication Instructions:  Your physician recommends that you continue on your current medications as directed. Please refer to the Current Medication list given to you today.  *If you need a refill on your cardiac medications before your next appointment, please call your pharmacy*   Lab Work: STAT labs today:  Troponin, D-Dimer, Cbc. Bmp  Please have your labs drawn today at the [facility] medical mall lab. Please stop at the registration desk to check in.  If you have labs (blood work) drawn today and your tests are completely normal, you will receive your results only by: Marland Kitchen MyChart Message (if you have MyChart) OR . A paper copy in the mail If you have any lab test that is abnormal or we need to change your treatment, we will call you to review the results.   Testing/Procedures: Your physician has requested that you have an echocardiogram. Echocardiography is a painless test that uses sound waves to create images of your heart. It provides your doctor with information about the size and shape of your heart and how well your heart's chambers and valves are working. This procedure takes approximately one hour. There are no restrictions for this procedure.     Follow-Up: At [facility], you and your health needs are our priority.  As part of our continuing mission to provide you with exceptional heart care, we have created designated Provider Care Teams.  These Care Teams include your primary Cardiologist (physician) and Advanced Practice Providers (APPs -  Physician Assistants and Nurse Practitioners) who all work together to provide you with the care you need, when you need it.  We recommend signing up for the patient portal called "MyChart".  Sign up information is provided on this After Visit Summary.  MyChart is used to connect with patients for Virtual Visits (Telemedicine).  Patients are able to view lab/test results, encounter notes, upcoming appointments, etc.  Non-urgent messages  can be sent to your provider as well.   To learn more about what you can do with MyChart, go to [email or web address].    Your next appointment:   2 week(s)  The format for your next appointment:   In Person  Provider:   Debbe Odea, MD   Other Instructions N/A

## 2020-01-30 LAB — D-DIMER, QUANTITATIVE: D-Dimer, Quant: 0.45 ug/mL-FEU (ref 0.00–0.50)

## 2020-01-31 ENCOUNTER — Telehealth: Payer: Self-pay | Admitting: Cardiovascular Disease

## 2020-01-31 ENCOUNTER — Telehealth: Payer: Self-pay | Admitting: Cardiology

## 2020-01-31 NOTE — Telephone Encounter (Signed)
Called and spoke with Diamond Nickel per DPR on file. I informed her that Dr. Kirke Corin had not reviewed the Lab results yet, and gave her a preliminary report that nothing stood out or looked urgent. I informed her we would be in touch soon after Dr. Kirke Corin has reviewed them.  She agreed with plan.

## 2020-01-31 NOTE — Telephone Encounter (Signed)
Patient needs letter to return to work. Patient is hoping to return as soon as tomorrow depending on weather. Patient is going to be back in the office for another appointment on Monday 1/24 and would like to pick the letter up if possible  Please advise

## 2020-01-31 NOTE — Telephone Encounter (Signed)
Wrote letter and left at front desk for patient to pick up. Called and informed patient of this.  Debbe Odea, MD  You 11 minutes ago (3:06 PM)   Okay for patient to return to work. Please write standard letter. Can use my signature/stamp. Thank you   Routing comment

## 2020-01-31 NOTE — Telephone Encounter (Signed)
Please call with labwork results 

## 2020-02-03 ENCOUNTER — Other Ambulatory Visit: Payer: Self-pay

## 2020-02-03 ENCOUNTER — Ambulatory Visit (INDEPENDENT_AMBULATORY_CARE_PROVIDER_SITE_OTHER): Payer: Self-pay

## 2020-02-03 DIAGNOSIS — R0602 Shortness of breath: Secondary | ICD-10-CM

## 2020-02-03 DIAGNOSIS — R079 Chest pain, unspecified: Secondary | ICD-10-CM

## 2020-02-03 LAB — ECHOCARDIOGRAM COMPLETE
Area-P 1/2: 2.56 cm2
Calc EF: 62.5 %
S' Lateral: 2.9 cm
Single Plane A2C EF: 62.5 %
Single Plane A4C EF: 59.7 %

## 2020-02-11 ENCOUNTER — Ambulatory Visit: Payer: Self-pay | Admitting: Cardiology

## 2020-02-12 ENCOUNTER — Encounter: Payer: Self-pay | Admitting: Cardiology

## 2020-02-13 ENCOUNTER — Ambulatory Visit: Payer: Medicaid Other | Admitting: Family Medicine

## 2020-02-13 ENCOUNTER — Other Ambulatory Visit: Payer: Self-pay

## 2020-02-13 ENCOUNTER — Other Ambulatory Visit: Payer: Self-pay | Admitting: Family Medicine

## 2020-02-13 DIAGNOSIS — U099 Post covid-19 condition, unspecified: Secondary | ICD-10-CM

## 2020-02-13 MED ORDER — BENZONATATE 100 MG PO CAPS
100.0000 mg | ORAL_CAPSULE | Freq: Three times a day (TID) | ORAL | 0 refills | Status: DC | PRN
Start: 2020-02-13 — End: 2020-02-13

## 2020-02-13 NOTE — Patient Instructions (Signed)
Please follow up in about 3-4 weeks.  Cough can linger after Jason Wiggins viral infection.  We'll give you tessalon perles for your cough.  Return for any worsening symptoms.  Please reschedule your imaging studies.

## 2020-02-13 NOTE — Progress Notes (Signed)
Problem List Items Addressed This Visit      Other   Post covid-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  He's here with complaints of chronic cough and sore throat.  Exam was reassuring without exudate or erythema.  Normal O2 sat and respiratory effort.  No extra lung sounds on exam.    He notes overall improvement from symptoms several weeks ago.  I think his cough and sore throat are probably related to his recent COVID infection.    I think can defer additional work up at this point with improvement and likely post viral symptoms.   Tessalon perles.  Follow up in about 4 weeks.    If persistent, may need additional work up for sore throat/cough (chest imaging, etc) (of note, he was Travanti Mcmanus little unclear about chronicity about some of these symptoms - says he's had issues with ?pneumonia/respiratory symptoms off/on over past 2 years - hard to get specific about this - notably had covid infection about 1 year ago as well and had imaging with findings concerning for post covid changes prior to this).   He tells me he has pending CT scan, but I don't see any pending orders.  No need for imaging at this visit from my view - will follow and consider in the future.          Established Patient Office Visit  Subjective:  Patient ID: Jason Stcharles., male    DOB: 1955-08-31  Age: 65 y.o. MRN: [id number]  CC: No chief complaint on file.   HPI Jason Cove. presents for sore throat, cough.  Sore throat.  Killing him.  Red, looks like it's bleeding.  Even before covid.  Has had sore throat for 2 years? (when asked to elaborate, Jason Wiggins bit unclear exactly which symptoms were going on for 2 years, but sounds like he's had different respiratory issues? cough, sore throat, SOB off/on).  SOB just when he has mask on.  No CP.  Throat and cough right now are most concerning symptoms.  Fevers 3 weeks ago.   Overall feeling better than when he  initially had covid about 3 weeks ago.   Denies need for med refills  Past Medical History:  Diagnosis Date  . Alcohol abuse    possibly as much as 24 beers per day,in past  . Arthritis   . COVID-19 virus infection 02/2019  . History of echocardiogram    Jason Wiggins. 10/2018 Echo: EF 55-60%, borderline LVH. Diast dysfxn. No significant valvular dzs.  Marland Kitchen HTN (hypertension)   . Nonobstructive CAD (coronary artery disease)    Jason Wiggins. 10/2016 Cath: LM nl, LAD 60m (FFR nl @ 0.83), LCX nl, RCA nl, EF nl; b. 10/2018 MV: no ischemia/infarct. EF 55-60%. Low risk.    Past Surgical History:  Procedure Laterality Date  . CARDIAC CATHETERIZATION    . HERNIA REPAIR     Right inguinal  . INTRAVASCULAR PRESSURE WIRE/FFR STUDY N/Jason Wiggins 10/28/2016   Procedure: INTRAVASCULAR PRESSURE WIRE/FFR STUDY;  Surgeon: Marykay Lex, MD;  Location: [facility] INVASIVE CV LAB;  Service: Cardiovascular;  Laterality: N/Jason Wiggins;  . LEFT HEART CATH AND CORONARY ANGIOGRAPHY N/Jason Wiggins 10/28/2016   Procedure: LEFT HEART CATH AND CORONARY ANGIOGRAPHY;  Surgeon: Marykay Lex, MD;  Location: [facility] INVASIVE CV LAB;  Service: Cardiovascular;  Laterality: N/Jason Wiggins;    Family History  Problem Relation Age of Onset  . Cancer Mother   .  Cancer Father     Social History   Socioeconomic History  . Marital status: Legally Separated    Spouse name: Not on file  . Number of children: 2  . Years of education: Not on file  . Highest education level: Not on file  Occupational History  . Not on file  Tobacco Use  . Smoking status: Never Smoker  . Smokeless tobacco: Never Used  Vaping Use  . Vaping Use: Never used  Substance and Sexual Activity  . Alcohol use: Yes    Comment: 6 drinks / week   . Drug use: No  . Sexual activity: Yes    Birth control/protection: Condom  Other Topics Concern  . Not on file  Social History Narrative   Lives with sister.  Lost his job.     Social Determinants of Health   Financial Resource Strain: High Risk  . Difficulty  of Paying Living Expenses: Very hard  Food Insecurity: Food Insecurity Present  . Worried About Programme researcher, broadcasting/film/video in the Last Year: Often true  . Ran Out of Food in the Last Year: Often true  Transportation Needs: No Transportation Needs  . Lack of Transportation (Medical): No  . Lack of Transportation (Non-Medical): No  Physical Activity: Inactive  . Days of Exercise per Week: 0 days  . Minutes of Exercise per Session: 0 min  Stress: Stress Concern Present  . Feeling of Stress : Very much  Social Connections: Socially Isolated  . Frequency of Communication with Friends and Family: More than three times Jason Wiggins week  . Frequency of Social Gatherings with Friends and Family: Once Jason Wiggins week  . Attends Religious Services: Never  . Active Member of Clubs or Organizations: No  . Attends Banker Meetings: Never  . Marital Status: Separated  Intimate Partner Violence: Not At Risk  . Fear of Current or Ex-Partner: No  . Emotionally Abused: No  . Physically Abused: No  . Sexually Abused: No    Outpatient Medications Prior to Visit  Medication Sig Dispense Refill  . albuterol (VENTOLIN HFA) 108 (90 Base) MCG/ACT inhaler Inhale 2 puffs into the lungs every 6 (six) hours as needed for wheezing or shortness of breath. 6.7 g 2  . aspirin EC 81 MG tablet Take 81 mg by mouth daily. Swallow whole.    . Ipratropium-Albuterol (COMBIVENT RESPIMAT) 20-100 MCG/ACT AERS respimat INHALE 1 PUFF EVERY 6 HOURS 12 g 3  . nitroGLYCERIN (NITROSTAT) 0.4 MG SL tablet Place 1 tablet (0.4 mg total) under the tongue every 5 (five) minutes as needed for chest pain. 30 tablet 1   No facility-administered medications prior to visit.    No Known Allergies  ROS Review of Systems As per HPI   Objective:    Physical Exam Constitutional:      General: He is not in acute distress.    Appearance: Normal appearance. He is normal weight. He is not ill-appearing.  HENT:     Head: Normocephalic and atraumatic.      Mouth/Throat:     Mouth: Mucous membranes are moist.     Pharynx: No oropharyngeal exudate or posterior oropharyngeal erythema.  Cardiovascular:     Pulses: Normal pulses.     Heart sounds: Normal heart sounds.  Pulmonary:     Effort: Pulmonary effort is normal. No respiratory distress.     Breath sounds: Normal breath sounds.  Abdominal:     General: Abdomen is flat. Bowel sounds are normal.  Palpations: Abdomen is soft.  Musculoskeletal:        General: No swelling. Normal range of motion.     Cervical back: Normal range of motion and neck supple.  Lymphadenopathy:     Cervical: No cervical adenopathy.  Skin:    General: Skin is warm and dry.  Neurological:     General: No focal deficit present.     Mental Status: He is alert and oriented to person, place, and time.    96% RA BP (!) 148/77 (BP Location: Left Arm, Patient Position: Sitting, Cuff Size: Normal)   Pulse 82   Temp 98.1 F (36.7 C) (Temporal)   Ht 5\' 9"  (1.753 m)   Wt 261 lb 6.4 oz (118.6 kg)   SpO2 96%   BMI 38.60 kg/m  Wt Readings from Last 3 Encounters:  02/13/20 261 lb 6.4 oz (118.6 kg)  01/29/20 255 lb (115.7 kg)  12/11/19 267 lb (121.1 kg)     Health Maintenance Due  Topic Date Due  . Hepatitis C Screening  Never done  . TETANUS/TDAP  Never done  . COLONOSCOPY (Pts 45-25yrs Insurance coverage will need to be confirmed)  Never done  . INFLUENZA VACCINE  Never done  . COVID-19 Vaccine (3 - Booster for Pfizer series) 11/22/2019    There are no preventive care reminders to display for this patient.  Lab Results  Component Value Date   TSH 1.733 04/26/2018   Lab Results  Component Value Date   WBC 8.1 01/29/2020   HGB 15.8 01/29/2020   HCT 44.8 01/29/2020   MCV 94.5 01/29/2020   PLT 235 01/29/2020   Lab Results  Component Value Date   NA 140 01/29/2020   K 4.4 01/29/2020   CO2 24 01/29/2020   GLUCOSE 108 (H) 01/29/2020   BUN 18 01/29/2020   CREATININE 1.02 01/29/2020    BILITOT 0.4 09/19/2018   ALKPHOS 73 09/19/2018   AST 39 09/19/2018   ALT 55 (H) 09/19/2018   PROT 6.3 09/19/2018   ALBUMIN 4.3 09/19/2018   CALCIUM 9.6 01/29/2020   ANIONGAP 12 01/29/2020   Lab Results  Component Value Date   CHOL 195 02/13/2019   Lab Results  Component Value Date   HDL 43 02/13/2019   Lab Results  Component Value Date   LDLCALC 85 02/13/2019   Lab Results  Component Value Date   TRIG 412 (H) 02/13/2019   Lab Results  Component Value Date   CHOLHDL 4.5 02/13/2019   Lab Results  Component Value Date   HGBA1C 5.4 08/22/2018      Assessment & Plan:   Problem List Items Addressed This Visit      Other   Post covid-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  He's here with complaints of chronic cough and sore throat.  Exam was reassuring without exudate or erythema.  Normal O2 sat and respiratory effort.  No extra lung sounds on exam.    He notes overall improvement from symptoms several weeks ago.  I think his cough and sore throat are probably related to his recent COVID infection.    I think can defer additional work up at this point with improvement and likely post viral symptoms.   Tessalon perles.  Follow up in about 4 weeks.    If persistent, may need additional work up for sore throat/cough (chest imaging, etc) (of note, he was  Jason Wiggins little unclear about chronicity about some of these symptoms - says he's had issues with ?pneumonia/respiratory symptoms off/on over past 2 years - hard to get specific about this - notably had covid infection about 1 year ago as well and had imaging with findings concerning for post covid changes prior to this).   He tells me he has pending CT scan, but I don't see any pending orders.  No need for imaging at this visit from my view - will follow and consider in the future.           Meds ordered this encounter  Medications  . benzonatate  (TESSALON PERLES) 100 MG capsule    Sig: Take 1 capsule (100 mg total) by mouth 3 (three) times daily as needed for cough.    Dispense:  20 capsule    Refill:  0    Follow-up: Return in about 4 weeks (around 03/12/2020).    Lacretia Nicks, MD

## 2020-02-14 DIAGNOSIS — U099 Post covid-19 condition, unspecified: Secondary | ICD-10-CM | POA: Insufficient documentation

## 2020-02-14 NOTE — Assessment & Plan Note (Addendum)
Tested positive for covid on POC test about 3 weeks ago (no results for [place] to see).  He's here with complaints of chronic cough and sore throat.  Exam was reassuring without exudate or erythema.  Normal O2 sat and respiratory effort.  No extra lung sounds on exam.    He notes overall improvement from symptoms several weeks ago.  I think his cough and sore throat are probably related to his recent COVID infection.    I think can defer additional work up at this point with improvement and likely post viral symptoms.   Tessalon perles.  Follow up in about 4 weeks.    If persistent, may need additional work up for sore throat/cough (chest imaging, etc) (of note, he was Fraya Ueda little unclear about chronicity about some of these symptoms - says he's had issues with ?pneumonia/respiratory symptoms off/on over past 2 years - hard to get specific about this - notably had covid infection about 1 year ago as well and had imaging with findings concerning for post covid changes prior to this).   He tells me he has pending CT scan, but I don't see any pending orders.  No need for imaging at this visit from my view - will follow and consider in the future.

## 2020-02-24 ENCOUNTER — Other Ambulatory Visit: Payer: Medicaid Other

## 2020-03-11 ENCOUNTER — Other Ambulatory Visit: Payer: Self-pay

## 2020-03-11 ENCOUNTER — Emergency Department
Admission: EM | Admit: 2020-03-11 | Discharge: 2020-03-11 | Disposition: A | Discharge status: Home / Self Care | Payer: Self-pay | Attending: Emergency Medicine | Admitting: Emergency Medicine

## 2020-03-11 ENCOUNTER — Emergency Department: Payer: Self-pay

## 2020-03-11 DIAGNOSIS — Z7982 Long term (current) use of aspirin: Secondary | ICD-10-CM | POA: Insufficient documentation

## 2020-03-11 DIAGNOSIS — Z8616 Personal history of COVID-19: Secondary | ICD-10-CM | POA: Insufficient documentation

## 2020-03-11 DIAGNOSIS — Z955 Presence of coronary angioplasty implant and graft: Secondary | ICD-10-CM | POA: Insufficient documentation

## 2020-03-11 DIAGNOSIS — S8391XA Sprain of unspecified site of right knee, initial encounter: Secondary | ICD-10-CM | POA: Insufficient documentation

## 2020-03-11 DIAGNOSIS — I2510 Atherosclerotic heart disease of native coronary artery without angina pectoris: Secondary | ICD-10-CM | POA: Insufficient documentation

## 2020-03-11 DIAGNOSIS — R55 Syncope and collapse: Secondary | ICD-10-CM | POA: Insufficient documentation

## 2020-03-11 DIAGNOSIS — I10 Essential (primary) hypertension: Secondary | ICD-10-CM | POA: Insufficient documentation

## 2020-03-11 DIAGNOSIS — W06XXXA Fall from bed, initial encounter: Secondary | ICD-10-CM | POA: Insufficient documentation

## 2020-03-11 DIAGNOSIS — S40011A Contusion of right shoulder, initial encounter: Secondary | ICD-10-CM | POA: Insufficient documentation

## 2020-03-11 LAB — BASIC METABOLIC PANEL
Anion gap: 8 (ref 5–15)
BUN: 15 mg/dL (ref 8–23)
CO2: 27 mmol/L (ref 22–32)
Calcium: 9.1 mg/dL (ref 8.9–10.3)
Chloride: 106 mmol/L (ref 98–111)
Creatinine, Ser: 0.72 mg/dL (ref 0.61–1.24)
GFR, Estimated: >60 mL/min (ref >60)
Glucose, Bld: 149 mg/dL — ABNORMAL HIGH (ref 70–99)
Potassium: 3.9 mmol/L (ref 3.5–5.1)
Sodium: 141 mmol/L (ref 135–145)

## 2020-03-11 LAB — CBC
HCT: 41.6 % (ref 39.0–52.0)
Hemoglobin: 15 g/dL (ref 13.0–17.0)
MCH: 34.1 pg — ABNORMAL HIGH (ref 26.0–34.0)
MCHC: 36.1 g/dL — ABNORMAL HIGH (ref 30.0–36.0)
MCV: 94.5 fL (ref 80.0–100.0)
Platelets: 164 10*3/uL (ref 150–400)
RBC: 4.4 MIL/uL (ref 4.22–5.81)
RDW: 12.7 % (ref 11.5–15.5)
WBC: 5.5 10*3/uL (ref 4.0–10.5)
nRBC: 0 % (ref 0.0–0.2)

## 2020-03-11 MED ORDER — KETOROLAC TROMETHAMINE 30 MG/ML IJ SOLN
30.0000 mg | Freq: Once | INTRAMUSCULAR | Status: AC
  Administered 2020-03-11: 30 mg via INTRAMUSCULAR
  Filled 2020-03-11: qty 1

## 2020-03-11 MED ORDER — NAPROXEN 500 MG PO TABS: 500.0000 mg | ORAL_TABLET | Freq: Two times a day (BID) | ORAL | 2 refills | Status: DC

## 2020-03-11 NOTE — ED Provider Notes (Addendum)
[facility] Emergency Department Provider Note   ____________________________________________    I have reviewed the triage vital signs and the nursing notes.   HISTORY  Chief Complaint Loss of Consciousness     HPI Jason Wiggins. is a 65 y.o. male with history as noted below who presents today after a near syncopal episode.  Patient reports he got out of bed this morning, felt lightheaded and fell to the floor.  He landed on his right shoulder and injured his right knee.  He is able to ambulate but describes it as painful.  Denies chest pain or palpitations.  No nausea vomiting or diaphoresis.  No shortness of breath.  No chest wall pain.  No back pain.  Has not take anything for this.  No new medications.  Past Medical History:  Diagnosis Date  . Alcohol abuse    possibly as much as 24 beers per day,in past  . Arthritis   . COVID-19 virus infection 02/2019  . History of echocardiogram    a. 10/2018 Echo: EF 55-60%, borderline LVH. Diast dysfxn. No significant valvular dzs.  Marland Kitchen HTN (hypertension)   . Nonobstructive CAD (coronary artery disease)    a. 10/2016 Cath: LM nl, LAD 38m (FFR nl @ 0.83), LCX nl, RCA nl, EF nl; b. 10/2018 MV: no ischemia/infarct. EF 55-60%. Low risk.    Patient Active Problem List   Diagnosis Date Noted  . Post covid-19 condition, unspecified 02/14/2020  . Rash 09/12/2019  . Generalized body aches 08/08/2019  . Decreased breath sounds 06/13/2019  . Common cold 12/18/2018  . Cough 12/18/2018  . Essential hypertension 10/16/2018  . Contact dermatitis due to poison ivy 09/18/2018  . Chest pain on exertion 09/18/2018  . Edema of both lower extremities 08/22/2018  . Pleuritic chest pain 08/21/2018  . Encounter to establish care 08/21/2018  . History of coronary artery disease 08/21/2018  . Shortness of breath 08/21/2018  . Morbid obesity (HCC) 08/21/2018  . Sepsis (HCC) 04/26/2018  . Suspected COVID-19 virus infection  04/21/2018  . Hypoxia 03/21/2018  . Bleach ingestion 08/14/2017  . 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 Substance induced mood disorder (HCC) 08/14/2017  . Alcohol abuse 08/14/2017  . Unstable angina (HCC) 10/22/2016  . Hyperlipidemia 10/22/2016  . Medication management 10/22/2016    Past Surgical History:  Procedure Laterality Date  . CARDIAC CATHETERIZATION    . HERNIA REPAIR     Right inguinal  . INTRAVASCULAR PRESSURE WIRE/FFR STUDY N/A 10/28/2016   Procedure: INTRAVASCULAR PRESSURE WIRE/FFR STUDY;  Surgeon: Marykay Lex, MD;  Location: [facility] INVASIVE CV LAB;  Service: Cardiovascular;  Laterality: N/A;  . LEFT HEART CATH AND CORONARY ANGIOGRAPHY N/A 10/28/2016   Procedure: LEFT HEART CATH AND CORONARY ANGIOGRAPHY;  Surgeon: Marykay Lex, MD;  Location: [facility] INVASIVE CV LAB;  Service: Cardiovascular;  Laterality: N/A;    Prior to Admission medications   Medication Sig Start Date End Date Taking? Authorizing Provider  naproxen (NAPROSYN) 500 MG tablet Take 1 tablet (500 mg total) by mouth 2 (two) times daily with a meal. 03/11/20  Yes Jene Every, MD  albuterol (VENTOLIN HFA) 108 (90 Base) MCG/ACT inhaler Inhale 2 puffs into the lungs every 6 (six) hours as needed for wheezing or shortness of breath. 09/12/19   Iloabachie, Chioma E, NP  aspirin EC 81 MG tablet Take 81 mg by mouth daily. Swallow whole.    [provider]  benzonatate (TESSALON  PERLES) 100 MG capsule Take 1 capsule (100 mg total) by mouth 3 (three) times daily as needed for cough. 02/13/20   Zigmund Daniel., MD  Ipratropium-Albuterol (COMBIVENT RESPIMAT) 20-100 MCG/ACT AERS respimat INHALE 1 PUFF EVERY 6 HOURS 09/12/19   Iloabachie, Chioma E, NP  nitroGLYCERIN (NITROSTAT) 0.4 MG SL tablet Place 1 tablet (0.4 mg total) under the tongue every 5 (five) minutes as needed for chest pain. 04/11/19   Michiel Cowboy A, PA-C     Allergies Patient has no known  allergies.  Family History  Problem Relation Age of Onset  . Cancer Mother   . Cancer Father     Social History Social History   Tobacco Use  . Smoking status: Never Smoker  . Smokeless tobacco: Never Used  Vaping Use  . Vaping Use: Never used  Substance Use Topics  . Alcohol use: Yes    Comment: 6 pack a day  . Drug use: No    Review of Systems  Constitutional: No fever/chills Eyes: No visual changes.  ENT: No sore throat. Cardiovascular: As above Respiratory: Denies shortness of breath. Gastrointestinal: No abdominal pain.  No nausea, no vomiting.   Genitourinary: Negative for dysuria. Musculoskeletal: As above Skin: Negative for rash. Neurological: Negative for headaches or weakness   ____________________________________________   PHYSICAL EXAM:  VITAL SIGNS: ED Triage Vitals  Enc Vitals Group     BP 03/11/20 0824 (!) 167/86     Pulse Rate 03/11/20 0824 93     Resp 03/11/20 0824 17     Temp 03/11/20 0824 97.6 F (36.4 C)     Temp src --      SpO2 03/11/20 0824 100 %     Weight 03/11/20 0822 122.5 kg (270 lb)     Height 03/11/20 0822 1.753 m (5\' 9" )     Head Circumference --      Peak Flow --      Pain Score 03/11/20 0822 10     Pain Loc --      Pain Edu? --      Excl. in GC? --     Constitutional: Alert and oriented.   Nose: No congestion/rhinnorhea. Mouth/Throat: Mucous membranes are moist.   Neck:  Painless ROM Cardiovascular: Normal rate, regular rhythm. Grossly normal heart sounds.  Good peripheral circulation.  No chest wall tenderness palpation Respiratory: Normal respiratory effort.  No retractions. Lungs CTAB. Gastrointestinal: Soft and nontender. No distention.  No CVA tenderness.  Musculoskeletal: Patient with mild tenderness to the anterior portion of the knee however range of motion appears intact, mild effusion suspected, no bony normalities palpated, right upper extremity full range of motion, mild tenderness on the lateral aspect  of the shoulder no bony abnormalities most consistent with contusion.  Both hips normal with axial load, no vertebral tenderness to palpate Neurologic:  Normal speech and language. No gross focal neurologic deficits are appreciated.  Skin:  Skin is warm, dry and intact. No rash noted. Psychiatric: Mood and affect are normal. Speech and behavior are normal.  ____________________________________________   LABS (all labs ordered are listed, but only abnormal results are displayed)  Labs Reviewed  BASIC METABOLIC PANEL - Abnormal; Notable for the following components:      Result Value   Glucose, Bld 149 (*)    All other components within normal limits  CBC - Abnormal; Notable for the following components:   MCH 34.1 (*)    MCHC 36.1 (*)    All other components  within normal limits  URINALYSIS, COMPLETE (UACMP) WITH MICROSCOPIC  CBG MONITORING, ED   ____________________________________________  EKG  ED ECG REPORT I, Jene Every, the attending physician, personally viewed and interpreted this ECG.  Date: 03/11/2020  Rhythm: normal sinus rhythm QRS Axis: normal Intervals: normal ST/T Wave abnormalities: normal Narrative Interpretation: no evidence of acute ischemia  ____________________________________________  RADIOLOGY  X-ray knee reviewed by me, mild effusion no fracture X-ray shoulder without acute malady ____________________________________________   PROCEDURES  Procedure(s) performed: No  Procedures   Critical Care performed: No ____________________________________________   INITIAL IMPRESSION / ASSESSMENT AND PLAN / ED COURSE  Pertinent labs & imaging results that were available during my care of the patient were reviewed by me and considered in my medical decision making (see chart for details).  Patient presents after near syncopal episode with right shoulder and right knee pain.  Denies palpitations chest pain shortness of breath pleurisy  diaphoresis.  No new medications  X-rays obtained are reassuring, EKG is unremarkable, lab work is benign.  Patient without any dizziness here, appropriate for discharge at this time with close outpatient follow-up with PCP and Ortho as needed.  Recommend rice, NSAIDs  ____________________________________________   FINAL CLINICAL IMPRESSION(S) / ED DIAGNOSES  Final diagnoses:  Near syncope  Sprain of right knee, unspecified ligament, initial encounter  Contusion of right shoulder, initial encounter        Note:  This document was prepared using Dragon voice recognition software and may include unintentional dictation errors.   Jene Every, MD 03/11/20 1610    Jene Every, MD 03/11/20 [phone]

## 2020-03-11 NOTE — ED Notes (Signed)
Pt presents to ED with c/o of having a LOC that lasted for about "2 minutes". Pt states he already sleeping and then woke up to urinate and states "I could possibly still be sleeping". Pt has c/o of R knee and shoulder pain. No obvious deformity noted. Pt denies numbness or tingling, pt is able to move all extremities at this time. Pt states he cannot bear weight on L leg. Pt denies any sickness prior to this fall. Pt is A&Ox4. Pt denies hitting his head.

## 2020-03-11 NOTE — ED Triage Notes (Addendum)
Pt comes via POV from home with c/o LOC this morning. Pt states right shoulder and knee pain.  Pt denies hitting head and not currently on blood thinners.

## 2020-03-11 NOTE — ED Triage Notes (Signed)
First RN Note: pt to ED via POV, states got up this morning and had a syncopal episode and fell, pt states injured his R shoulder and R knee when he had a syncopal episode. Pt ambulatory to triage desk and placed in wheelchair by Misty Stanley, EDT.

## 2020-03-11 NOTE — ED Notes (Signed)
D/C and new RX discussed with pt, pt verbalized understadning. NAD noted. Pt denies any further questions or concerns for this RN at this time.

## 2020-03-12 ENCOUNTER — Ambulatory Visit: Payer: Medicaid Other | Admitting: Family Medicine

## 2020-03-12 VITALS — BP 130/88 | HR 93 | Temp 97.9°F | Ht 69.0 in | Wt 256.4 lb

## 2020-03-12 DIAGNOSIS — R739 Hyperglycemia, unspecified: Secondary | ICD-10-CM

## 2020-03-12 DIAGNOSIS — W19XXXA Unspecified fall, initial encounter: Secondary | ICD-10-CM

## 2020-03-12 DIAGNOSIS — I2510 Atherosclerotic heart disease of native coronary artery without angina pectoris: Secondary | ICD-10-CM

## 2020-03-12 DIAGNOSIS — M25511 Pain in right shoulder: Secondary | ICD-10-CM

## 2020-03-12 DIAGNOSIS — M25561 Pain in right knee: Secondary | ICD-10-CM

## 2020-03-12 DIAGNOSIS — U099 Post covid-19 condition, unspecified: Secondary | ICD-10-CM

## 2020-03-12 MED ORDER — DICLOFENAC SODIUM 1 % EX GEL: 2.0000 g | Freq: Four times a day (QID) | CUTANEOUS | Status: DC | PRN

## 2020-03-12 NOTE — Patient Instructions (Signed)
I think you should see the orthopedic doctors.    Use voltaren gel to your knee and shoulder as needed for pain.  You can also use tylenol as needed for pain.    I'll send Jason Wiggins referral to the orthopedic doctor.    Please follow up with your cardiologist as an outpatient.  Please follow up with [place] in about 1 month.

## 2020-03-12 NOTE — Progress Notes (Signed)
**Note De-Identified vi Obfusction** Estblished Ptient Office Visit  Subjective:  Ptient ID: Jason CoveFoster Schoenfeld Jr., mle    DOB: 05/14/1955  ge: 65 y.o. MRN: [id number]  CC:  Chief Complint  Ptient presents with  . Knee Pin    Fluid in knee, hs seen doctors regrding this recently.  . Shoulder Pin    HPI Jason CoveFoster Kristinsen Jr. presents for follow up.  Got up to go to bthroom, next thing he knew pulling himself off the floor.  No lighthededness.  Might've been hlf sleep nd slipped, doesn't know.  Hit shoulder on doorjm nd knee on dresser.  te nd drnk ok tht dy before.  Doesn't know wht time it ws.  Gets up 2-3 times t night.  Denies LOC, doesn't think he pssed out - thinks he got up, wsn't wke yet nd fell.   Mybe got tied up in sheet (he got rid of the blnket).  No CP.  No SOB.  Everything else ws fine.  No numbness, tingling, wekness.    C/o knee nd shoulder pin where he hit door.  Didn't go to work tody becuse of continued knee pin.  Felt unstble underneth him this morning.    Cough went wy.    Hsn't been tking medicine.  Uses nitro nd inhlers s needed.  Lst used nitro lst time before he cme here.    Pst Medicl History:  Dignosis Dte  . lcohol buse    possibly s much s 24 beers per dy,in pst  . rthritis   . COVID-19 virus infection 02/2019  . History of echocrdiogrm    . 10/2018 Echo: EF 55-60%, borderline LVH. Dist dysfxn. No significnt vlvulr dzs.  Mrlnd Kitchen. HTN (hypertension)   . Nonobstructive CD (coronry rtery disese)    . 10/2016 Cth: LM nl, LD 9746m (FFR nl @ 0.83), LCX nl, RC nl, EF nl; b. 10/2018 MV: no ischemi/infrct. EF 55-60%. Low risk.    Pst Surgicl History:  Procedure Lterlity Dte  . CRDIC CTHETERIZTION    . HERNI REPIR     Right inguinl  . INTRVSCULR PRESSURE WIRE/FFR STUDY N/ 10/28/2016   Procedure: INTRVSCULR PRESSURE WIRE/FFR STUDY;  Surgeon: Mryky LexHrding, Dvid W, MD;  [facility];  Service:  Crdiovsculr;  Lterlity: N/;  . LEFT HERT CTH ND CORONRY NGIOGRPHY N/ 10/28/2016   Procedure: LEFT HERT CTH ND CORONRY NGIOGRPHY;  Surgeon: Mryky LexHrding, Dvid W, MD;  [facility];  Service: Crdiovsculr;  Lterlity: N/;    Fmily History  Problem Reltion ge of Onset  . Cncer Mother   . Cncer Fther     Socil History   Socioeconomic History  . Mritl sttus: Leglly Seprted    Spouse nme: Not on file  . Number of children: 2  . Yers of eduction: Not on file  . Highest eduction level: Not on file  Occuptionl History  . Not on file  Tobcco Use  . Smoking sttus: Never Smoker  . Smokeless tobcco: Never Used  Vping Use  . Vping Use: Never used  Substnce nd Sexul ctivity  . lcohol use: Yes    Comment: 6 pck  dy  . Drug use: No  . Sexul ctivity: Yes    Birth control/protection: Condom  Other Topics Concern  . Not on file  Socil History Nrrtive   Lives with sister.  Lost his job.     Socil Determinnts of Helth   Finncil Resource Strin: High Risk  . Difficulty of Pying **Note De-Identified vi Obfusction** Living Expenses: Very hrd  Food Insecurity: Food Insecurity Present  . Worried About Progrmme resercher, brodcsting/film/video in the Lst Yer: Often true  . Rn Out of Food in the Lst Yer: Often true  Trnsporttion Needs: No Trnsporttion Needs  . Lck of Trnsporttion (Medicl): No  . Lck of Trnsporttion (Non-Medicl): No  Physicl Activity: Inctive  . Dys of Exercise per Week: 0 dys  . Minutes of Exercise per Session: 0 min  Stress: Stress Concern Present  . Feeling of Stress : Very much  Socil Connections: Socilly Isolted  . Frequency of Communiction with Friends nd Fmily: More thn three times  week  . Frequency of Socil Gtherings with Friends nd Fmily: Once  week  . Attends Religious Services: Never  . Active Member of Clubs or Orgniztions: No  . Attends Bnker Meetings: Never  . Mritl Sttus: Seprted   Intimte Prtner Violence: Not At Risk  . Fer of Current or Ex-Prtner: No  . Emotionlly Abused: No  . Physiclly Abused: No  . Sexully Abused: No    Outptient Medictions Prior to Visit  Mediction Sig Dispense Refill  . lbuterol (VENTOLIN HFA) 108 (90 Bse) MCG/ACT inhler Inhle 2 puffs into the lungs every 6 (six) hours s needed for wheezing or shortness of breth. 6.7 g 2  . spirin EC 81 MG tblet Tke 81 mg by mouth dily. Swllow whole.    . benzontte (TESSALON PERLES) 100 MG cpsule Tke 1 cpsule (100 mg totl) by mouth 3 (three) times dily s needed for cough. 20 cpsule 0  . Iprtropium-Albuterol (COMBIVENT RESPIMAT) 20-100 MCG/ACT AERS respimt INHALE 1 PUFF EVERY 6 HOURS 12 g 3  . nitroGLYCERIN (NITROSTAT) 0.4 MG SL tblet Plce 1 tblet (0.4 mg totl) under the tongue every 5 (five) minutes s needed for chest pin. 30 tblet 1  . nproxen (NAPROSYN) 500 MG tblet Tke 1 tblet (500 mg totl) by mouth 2 (two) times dily with  mel. 20 tblet 2   No fcility-dministered medictions prior to visit.    No Known Allergies  ROS Review of Systems As per HPI   Objective:    Physicl Exm Constitutionl:      Generl: He is not in cute distress.    Appernce: Norml ppernce.  Crdiovsculr:     Rte nd Rhythm: Norml rte nd regulr rhythm.     Pulses: Norml pulses.  Pulmonry:     Effort: Pulmonry effort is norml.     Breth sounds: Norml breth sounds.  Musculoskeletl:     Comments: Limited s he restricts exm due to pin R knee without significnt TTP, limited rnge of motion due to pin (does not llow me to rnge pssively).  Negtive nterior nd posterior drwer exms.  Unble to perform mcmurry s he doesn't llow me to pssively rnge knee due to pin.  L knee exm wnl.  Right shoulder exm similrly limited due to pin, limits my bility to plpte due to pin, intct ctive rnge of motion.  Wlks with limp.  Skin:    Generl:  Skin is wrm nd dry.  Neurologicl:     Generl: No focl deficit present.     Mentl Sttus: He is lert.     BP 130/88 (BP Loction: Left Arm, Ptient Position: Sitting, Cuff Size: Lrge)   Pulse 93   Temp 97.9 F (36.6 C)   Ht 5\' 9"  (1.753 m)   Wt 256 lb 6.4 oz (116.3 kg)   SpO2 95%   BMI 37.86 kg/m  Wt Readings from Last 3 Encounters:  03/12/20 256 lb 6.4 oz (116.3 kg)  03/11/20 270 lb (122.5 kg)  02/13/20 261 lb 6.4 oz (118.6 kg)     Health Maintenance Due  Topic Date Due  . Hepatitis C Screening  Never done  . TETANUS/TDAP  Never done  . COLONOSCOPY (Pts 45-74yrs Insurance coverage will need to be confirmed)  Never done  . INFLUENZA VACCINE  Never done  . COVID-19 Vaccine (3 - Booster for Pfizer series) 11/22/2019    There are no preventive care reminders to display for this patient.  Lab Results  Component Value Date   TSH 1.733 04/26/2018   Lab Results  Component Value Date   WBC 5.5 03/11/2020   HGB 15.0 03/11/2020   HCT 41.6 03/11/2020   MCV 94.5 03/11/2020   PLT 164 03/11/2020   Lab Results  Component Value Date   NA 141 03/11/2020   K 3.9 03/11/2020   CO2 27 03/11/2020   GLUCOSE 149 (H) 03/11/2020   BUN 15 03/11/2020   CREATININE 0.72 03/11/2020   BILITOT 0.4 09/19/2018   ALKPHOS 73 09/19/2018   AST 39 09/19/2018   ALT 55 (H) 09/19/2018   PROT 6.3 09/19/2018   ALBUMIN 4.3 09/19/2018   CALCIUM 9.1 03/11/2020   ANIONGAP 8 03/11/2020   Lab Results  Component Value Date   CHOL 195 02/13/2019   Lab Results  Component Value Date   HDL 43 02/13/2019   Lab Results  Component Value Date   LDLCALC 85 02/13/2019   Lab Results  Component Value Date   TRIG 412 (H) 02/13/2019   Lab Results  Component Value Date   CHOLHDL 4.5 02/13/2019   Lab Results  Component Value Date   HGBA1C 5.4 08/22/2018      Assessment & Plan:   Problem List Items Addressed This Visit      Cardiovascular and Mediastinum   Coronary artery disease     Cath 2018 with mid LAD lesion, 60% stenosed, not physiologically significant based on FFR.    Encouraged him to continue aspirin and follow up with cardiology.          Other   Post covid-19 condition, unspecified    Cough is improved.  Last visit he spoke to me about pending CT scan, but again he's not sure about this, I don't see any pending orders.  Could consider follow up with pulm given his hx covid.  Can discuss at next visit.      Knee pain, acute    2/2 fall.  Imaging with small effusion.  Exam today limited due to pain.  Encouraged follow up with orthopedics (will try to arrange that here at [facility] - discussed with staff).  He says he's unable to take nsaids/apap -> encouraged trial of voltaren.        Relevant Medications   diclofenac Sodium (VOLTAREN) 1 % topical gel 2 g   Shoulder pain, acute - Primary    Limited exam due to pain - ED imaging without acute abnormality - hill sacks abnormality.  Encouraged use of voltaren gel.  Will work on orthopedic follow up here at [facility] (discussed with staff).        Relevant Medications   diclofenac Sodium (VOLTAREN) 1 % topical gel 2 g   Hyperglycemia    Follow A1c given hyperglycemia on ED labs      Relevant Orders   HgB A1c **Note De-Identified vi Obfusction** Fll    Ws seen in ED yesterdy with concern for syncope vs mechnicl fll.  EKG ppered similr to priors.  Lbs unremrkble.  D/c'd with outptient follow up.  Tody when I discuss with him, he doesn't clerly tell me tht he hd LOC, thinks he my hve tried to get up before he ws clerly wke ( little uncler by his story whether there ws LOC or not).  Bsed on our discussion tody, my hve been more likely due to mechnicl fll compounded by sleepiness s he got up to use bthroom. Symptoms resolved t this time, will continue to monitor.           Meds ordered this encounter  Medictions  . diclofenc Sodium (VOLTAREN) 1 % topicl gel 2 g    Follow-up: Return in bout 4 weeks (round  04/09/2020) for follow A1c nd musculoskeletl pin - return for lbs before your next visit.    Lcreti Nicks, MD

## 2020-03-13 DIAGNOSIS — R739 Hyperglycemia, unspecified: Secondary | ICD-10-CM | POA: Insufficient documentation

## 2020-03-13 DIAGNOSIS — M25519 Pain in unspecified shoulder: Secondary | ICD-10-CM | POA: Insufficient documentation

## 2020-03-13 DIAGNOSIS — M79604 Pain in right leg: Secondary | ICD-10-CM | POA: Insufficient documentation

## 2020-03-13 DIAGNOSIS — M25569 Pain in unspecified knee: Secondary | ICD-10-CM | POA: Insufficient documentation

## 2020-03-13 DIAGNOSIS — W19XXXA Unspecified fall, initial encounter: Secondary | ICD-10-CM | POA: Insufficient documentation

## 2020-03-13 DIAGNOSIS — I2510 Atherosclerotic heart disease of native coronary artery without angina pectoris: Secondary | ICD-10-CM | POA: Insufficient documentation

## 2020-03-13 NOTE — Assessment & Plan Note (Signed)
Cath 2018 with mid LAD lesion, 60% stenosed, not physiologically significant based on FFR.    Encouraged him to continue aspirin and follow up with cardiology.

## 2020-03-13 NOTE — Assessment & Plan Note (Signed)
Cough is improved.  Last visit he spoke to me about pending CT scan, but again he's not sure about this, I don't see any pending orders.  Could consider follow up with pulm given his hx covid.  Can discuss at next visit.

## 2020-03-13 NOTE — Assessment & Plan Note (Addendum)
Was seen in ED yesterday with concern for syncope vs mechanical fall.  EKG appeared similar to priors.  Labs unremarkable.  D/c'd with outpatient follow up.  Today when I discuss with him, he doesn't clearly tell me that he had LOC, thinks he may have tried to get up before he was clearly awake (a little unclear by his story whether there was LOC or not).  Based on our discussion today, may have been more likely due to mechanical fall compounded by sleepiness as he got up to use bathroom. Symptoms resolved at this time, will continue to monitor.

## 2020-03-13 NOTE — Assessment & Plan Note (Signed)
Follow A1c given hyperglycemia on ED labs

## 2020-03-13 NOTE — Assessment & Plan Note (Signed)
Limited exam due to pain - ED imaging without acute abnormality - hill sacks abnormality.  Encouraged use of voltaren gel.  Will work on orthopedic follow up here at [facility] (discussed with staff).

## 2020-03-13 NOTE — Assessment & Plan Note (Signed)
2/2 fall.  Imaging with small effusion.  Exam today limited due to pain.  Encouraged follow up with orthopedics (will try to arrange that here at [facility] - discussed with staff).  He says he's unable to take nsaids/apap -> encouraged trial of voltaren.

## 2020-03-16 ENCOUNTER — Ambulatory Visit: Payer: Self-pay | Admitting: Cardiology

## 2020-03-17 ENCOUNTER — Encounter: Payer: Self-pay | Admitting: Cardiology

## 2020-03-17 ENCOUNTER — Telehealth: Payer: Self-pay

## 2020-03-17 NOTE — Telephone Encounter (Signed)
Tried calling to schedule ortho appt. Was unable to LVM. scheduled appt for 4/5 at 10am.

## 2020-03-18 ENCOUNTER — Telehealth: Payer: Self-pay | Admitting: Pharmacist

## 2020-03-18 NOTE — Telephone Encounter (Signed)
Patient failed to provide requested 2022 financial documentation. Unable to determine patient's eligibility status. No additional medication assistance will be provided by [facility] without the required proof of income documentation. Patient notified by letter.  Vonda [facility] Administrative Assistant 

## 2020-04-02 ENCOUNTER — Other Ambulatory Visit: Payer: Medicaid Other

## 2020-04-09 ENCOUNTER — Other Ambulatory Visit: Payer: Medicaid Other

## 2020-04-14 ENCOUNTER — Ambulatory Visit: Payer: Self-pay | Admitting: Specialist

## 2020-04-16 ENCOUNTER — Ambulatory Visit: Payer: Medicaid Other

## 2020-04-21 ENCOUNTER — Ambulatory Visit: Payer: Medicaid Other | Admitting: Primary Care

## 2020-04-24 ENCOUNTER — Other Ambulatory Visit: Payer: Self-pay

## 2020-04-30 ENCOUNTER — Other Ambulatory Visit: Payer: Self-pay

## 2020-04-30 MED FILL — Albuterol Sulfate Inhal Aero 108 MCG/ACT (90MCG Base Equiv): RESPIRATORY_TRACT | 90 days supply | Qty: 25.5 | Fill #0 | Status: CN

## 2020-05-27 ENCOUNTER — Ambulatory Visit: Payer: Medicaid Other | Admitting: Primary Care

## 2020-05-29 ENCOUNTER — Other Ambulatory Visit: Payer: Self-pay

## 2020-05-30 ENCOUNTER — Other Ambulatory Visit (HOSPITAL_COMMUNITY): Payer: Self-pay

## 2020-06-16 ENCOUNTER — Other Ambulatory Visit: Payer: Self-pay

## 2020-07-22 ENCOUNTER — Other Ambulatory Visit: Payer: Self-pay

## 2020-07-30 ENCOUNTER — Other Ambulatory Visit: Payer: Medicaid Other

## 2020-07-30 ENCOUNTER — Other Ambulatory Visit: Payer: Self-pay

## 2020-07-30 DIAGNOSIS — R0602 Shortness of breath: Secondary | ICD-10-CM

## 2020-07-30 DIAGNOSIS — I10 Essential (primary) hypertension: Secondary | ICD-10-CM

## 2020-07-30 DIAGNOSIS — R079 Chest pain, unspecified: Secondary | ICD-10-CM

## 2020-07-30 DIAGNOSIS — R739 Hyperglycemia, unspecified: Secondary | ICD-10-CM

## 2020-07-31 LAB — HEMOGLOBIN A1C
Est. average glucose Bld gHb Est-mCnc: 111 mg/dL
Hgb A1c MFr Bld: 5.5 % (ref 4.8–5.6)

## 2020-07-31 LAB — POTASSIUM: Potassium: 3.9 mmol/L (ref 3.5–5.2)

## 2020-08-06 ENCOUNTER — Other Ambulatory Visit: Payer: Self-pay

## 2020-08-06 ENCOUNTER — Ambulatory Visit: Payer: Medicaid Other | Admitting: Gerontology

## 2020-08-06 VITALS — BP 143/83 | HR 92 | Temp 97.7°F | Resp 18 | Ht 73.0 in | Wt 243.4 lb

## 2020-08-06 DIAGNOSIS — R059 Cough, unspecified: Secondary | ICD-10-CM

## 2020-08-06 DIAGNOSIS — R0981 Nasal congestion: Secondary | ICD-10-CM | POA: Insufficient documentation

## 2020-08-06 DIAGNOSIS — R0602 Shortness of breath: Secondary | ICD-10-CM

## 2020-08-06 MED ORDER — COMBIVENT RESPIMAT 20-100 MCG/ACT IN AERS
INHALATION_SPRAY | RESPIRATORY_TRACT | 3 refills | Status: DC
  Filled 2020-08-06: qty 12, fill #0

## 2020-08-06 MED ORDER — BENZONATATE 100 MG PO CAPS
100.0000 mg | ORAL_CAPSULE | Freq: Three times a day (TID) | ORAL | 0 refills | Status: DC | PRN
  Filled 2020-08-06: qty 20, 7d supply, fill #0

## 2020-08-06 MED ORDER — FLUTICASONE PROPIONATE 50 MCG/ACT NA SUSP
2.0000 | Freq: Every day | NASAL | 0 refills | Status: DC
  Filled 2020-08-06: qty 16, fill #0

## 2020-08-06 NOTE — Progress Notes (Signed)
Established Patient Office Visit  Subjective:  Patient ID: Jason Wiggins., male    DOB: 1955/04/21  Age: 65 y.o. MRN: [id number]  CC:  Chief Complaint  Patient presents with   Cough    Sneezing, runny nose     HPI Jason Wiggins. is a 65 year old male who has history of alcohol abuse, COVID-19 virus infection, hypertension CAD, presents for c/o productive cough with yellowish phlegm, that has been going on for 1 month. He states that cough wakes him up at night. He also c/o nasal congestion with intermittent rhinorrhea and maxillary sinus tenderness. He denies chest pain, palpitation, dizziness, fever, chills, headache, sore throat, and he tested negative Covid 19 virus.  Overall, he states that he is doing well and offers no further complaints.  Past Medical History:  Diagnosis Date   Alcohol abuse    possibly as much as 24 beers per day,in past   Arthritis    COVID-19 virus infection 02/2019   History of echocardiogram    a. 10/2018 Echo: EF 55-60%, borderline LVH. Diast dysfxn. No significant valvular dzs.   HTN (hypertension)    Nonobstructive CAD (coronary artery disease)    a. 10/2016 Cath: LM nl, LAD 74m (FFR nl @ 0.83), LCX nl, RCA nl, EF nl; b. 10/2018 MV: no ischemia/infarct. EF 55-60%. Low risk.    Past Surgical History:  Procedure Laterality Date   CARDIAC CATHETERIZATION     HERNIA REPAIR     Right inguinal   INTRAVASCULAR PRESSURE WIRE/FFR STUDY N/A 10/28/2016   Procedure: INTRAVASCULAR PRESSURE WIRE/FFR STUDY;  Surgeon: Marykay Lex, MD;  Location: [facility] INVASIVE CV LAB;  Service: Cardiovascular;  Laterality: N/A;   LEFT HEART CATH AND CORONARY ANGIOGRAPHY N/A 10/28/2016   Procedure: LEFT HEART CATH AND CORONARY ANGIOGRAPHY;  Surgeon: Marykay Lex, MD;  Location: [facility] INVASIVE CV LAB;  Service: Cardiovascular;  Laterality: N/A;    Family History  Problem Relation Age of Onset   Cancer Mother    Cancer Father     Social History   Socioeconomic  History   Marital status: Legally Separated    Spouse name: Not on file   Number of children: 2   Years of education: Not on file   Highest education level: Not on file  Occupational History   Not on file  Tobacco Use   Smoking status: Never   Smokeless tobacco: Never  Vaping Use   Vaping Use: Never used  Substance and Sexual Activity   Alcohol use: Yes    Comment: 6 pack a day   Drug use: No   Sexual activity: Yes    Birth control/protection: Condom  Other Topics Concern   Not on file  Social History Narrative   Lives with sister.  Lost his job.     Social Determinants of Health   Financial Resource Strain: Not on file  Food Insecurity: Not on file  Transportation Needs: Not on file  Physical Activity: Unknown   Days of Exercise per Week: 0 days   Minutes of Exercise per Session: Not on file  Stress: Not on file  Social Connections: Unknown   Frequency of Communication with Friends and Family: Not on file   Frequency of Social Gatherings with Friends and Family: Once a week   Attends Religious Services: Never   Database administrator or Organizations: Not on file   Attends Banker Meetings: Not on file   Marital Status: Separated  Intimate Partner Violence:  Not on file    Outpatient Medications Prior to Visit  Medication Sig Dispense Refill   aspirin EC 81 MG tablet Take 81 mg by mouth daily. Swallow whole. (Patient not taking: Reported on 08/06/2020)     nitroGLYCERIN (NITROSTAT) 0.4 MG SL tablet Place 1 tablet (0.4 mg total) under the tongue every 5 (five) minutes as needed for chest pain. 30 tablet 1   albuterol (VENTOLIN HFA) 108 (90 Base) MCG/ACT inhaler Inhale 2 puffs into the lungs every 6 (six) hours as needed for wheezing or shortness of breath. 6.7 g 2   benzonatate (TESSALON) 100 MG capsule TAKE ONE CAPSULE BY MOUTH 3 TIMES A DAY AS NEEDED FOR COUGH (Patient not taking: Reported on 08/06/2020) 20 capsule 0   Ipratropium-Albuterol (COMBIVENT  RESPIMAT) 20-100 MCG/ACT AERS respimat INHALE 1 PUFF EVERY 6 HOURS 12 g 3   PROAIR HFA 108 (90 Base) MCG/ACT inhaler INHALE 2 PUFFS BY MOUTH EVERY 6 HOURS AS NEEDED FOR WHEEZING OR 25.5 g 99   Facility-Administered Medications Prior to Visit  Medication Dose Route Frequency Provider Last Rate Last Admin   diclofenac Sodium (VOLTAREN) 1 % topical gel 2 g  2 g Topical QID PRN Zigmund Daniel., MD        No Known Allergies  ROS Review of Systems  Constitutional: Negative.   HENT:  Positive for congestion, rhinorrhea and sneezing. Negative for postnasal drip and sore throat.   Respiratory:  Positive for shortness of breath (with exertion).   Cardiovascular: Negative.   Neurological: Negative.      Objective:    Physical Exam HENT:     Head: Normocephalic and atraumatic.     Nose:     Right Sinus: Maxillary sinus tenderness present.     Left Sinus: Maxillary sinus tenderness (tenderness with percusion) present.  Eyes:     Extraocular Movements: Extraocular movements intact.     Conjunctiva/sclera: Conjunctivae normal.     Pupils: Pupils are equal, round, and reactive to light.  Cardiovascular:     Rate and Rhythm: Normal rate and regular rhythm.     Pulses: Normal pulses.     Heart sounds: Normal heart sounds.  Pulmonary:     Effort: Pulmonary effort is normal.     Breath sounds: Normal breath sounds.  Skin:    General: Skin is warm.  Neurological:     General: No focal deficit present.     Mental Status: He is alert and oriented to person, place, and time. Mental status is at baseline.  Psychiatric:        Mood and Affect: Mood normal.        Behavior: Behavior normal.        Thought Content: Thought content normal.    BP (!) 143/83 (BP Location: Left Arm, Cuff Size: Large)   Pulse 92   Temp 97.7 F (36.5 C) (Oral)   Resp 18   Ht 6\' 1"  (1.854 m)   Wt 243 lb 6.4 oz (110.4 kg)   SpO2 96%   BMI 32.11 kg/m  Wt Readings from Last 3 Encounters:  08/06/20 243 lb  6.4 oz (110.4 kg)  03/12/20 256 lb 6.4 oz (116.3 kg)  03/11/20 270 lb (122.5 kg)   He was encouraged to continue on his weight loss regimen.  Health Maintenance Due  Topic Date Due   Pneumococcal Vaccine 75-64 Years old (1 - PCV) Never done   Hepatitis C Screening  Never done   TETANUS/TDAP  Never  done   Zoster Vaccines- Shingrix (1 of 2) Never done   COLONOSCOPY (Pts 45-42yrs Insurance coverage will need to be confirmed)  Never done   COVID-19 Vaccine (3 - Pfizer risk series) 06/19/2019    There are no preventive care reminders to display for this patient.  Lab Results  Component Value Date   TSH 1.733 04/26/2018   Lab Results  Component Value Date   WBC 5.5 03/11/2020   HGB 15.0 03/11/2020   HCT 41.6 03/11/2020   MCV 94.5 03/11/2020   PLT 164 03/11/2020   Lab Results  Component Value Date   NA 141 03/11/2020   K 3.9 07/30/2020   CO2 27 03/11/2020   GLUCOSE 149 (H) 03/11/2020   BUN 15 03/11/2020   CREATININE 0.72 03/11/2020   BILITOT 0.4 09/19/2018   ALKPHOS 73 09/19/2018   AST 39 09/19/2018   ALT 55 (H) 09/19/2018   PROT 6.3 09/19/2018   ALBUMIN 4.3 09/19/2018   CALCIUM 9.1 03/11/2020   ANIONGAP 8 03/11/2020   Lab Results  Component Value Date   CHOL 195 02/13/2019   Lab Results  Component Value Date   HDL 43 02/13/2019   Lab Results  Component Value Date   LDLCALC 85 02/13/2019   Lab Results  Component Value Date   TRIG 412 (H) 02/13/2019   Lab Results  Component Value Date   CHOLHDL 4.5 02/13/2019   Lab Results  Component Value Date   HGBA1C 5.5 07/30/2020      Assessment & Plan:     1. Shortness of breath -He will continue on current medication, was advised to go to the emergency room for worsening symptoms. - Ipratropium-Albuterol (COMBIVENT RESPIMAT) 20-100 MCG/ACT AERS respimat; INHALE 1 PUFF EVERY 6 HOURS  Dispense: 12 g; Refill: 3  2. Cough -He will continue on [employer], educated about medication side effects and was  advised to notify clinic. - benzonatate (TESSALON PERLES) 100 MG capsule; Take 1 capsule (100 mg total) by mouth 3 (three) times daily as needed for cough.  Dispense: 20 capsule; Refill: 0  3. Nasal congestion -He will continue on Flonase for 3 days, was educated about medication side effects and was advised to notify clinic. - fluticasone (FLONASE) 50 MCG/ACT nasal spray; Place 2 sprays into both nostrils daily.  Dispense: 16 g; Refill: 0   Follow-up: Return in about 16 weeks (around 11/26/2020), or if symptoms worsen or fail to improve.    Christohper Dube Trellis Paganini, NP

## 2020-08-07 ENCOUNTER — Other Ambulatory Visit: Payer: Self-pay

## 2020-08-13 ENCOUNTER — Other Ambulatory Visit: Payer: Self-pay

## 2020-08-13 ENCOUNTER — Other Ambulatory Visit: Payer: Self-pay | Admitting: Gerontology

## 2020-08-24 ENCOUNTER — Telehealth: Payer: Self-pay | Admitting: Pharmacist

## 2020-08-24 NOTE — Telephone Encounter (Signed)
08/24/2020 10:47:18 AM - Pat. Inactive Spreadsheet-Recert not returned  -- Annette Johnson - Monday, August 24, 2020 10:44 AM --Patient enrollment with [facility] ended March 2022--Patient INACTIVE on spreadsheet--RECERT NOT RETURNED.  Patient enrollment with [employer] for Combivent Respimat will end Oct. 2022. I am unable to renew this enrollment until patient recert has been completed for [facility].  

## 2020-08-24 NOTE — Telephone Encounter (Signed)
08/24/2020 10:47:18 AM - Dennie Bible. Inactive Spreadsheet-Recert not returned  -- Rhetta Mura - Monday, August 24, 2020 10:44 AM --Patient enrollment with [facility] ended March 2022--Patient INACTIVE on spreadsheet--RECERT NOT RETURNED.  Patient enrollment with [employer] for Combivent Respimat will end Oct. 2022. I am unable to renew this enrollment until patient recert has been completed for [facility].

## 2020-11-19 ENCOUNTER — Other Ambulatory Visit: Payer: Self-pay

## 2020-11-26 ENCOUNTER — Ambulatory Visit: Payer: Medicaid Other

## 2021-01-10 DIAGNOSIS — Z9889 Other specified postprocedural states: Secondary | ICD-10-CM

## 2021-01-10 HISTORY — DX: Other specified postprocedural states: Z98.890

## 2021-01-18 ENCOUNTER — Ambulatory Visit
Admission: RE | Admit: 2021-01-18 | Discharge: 2021-01-18 | Disposition: A | Discharge status: Home / Self Care | Payer: Medicaid Other | Source: Ambulatory Visit | Attending: Adult Health | Admitting: Adult Health

## 2021-01-18 ENCOUNTER — Ambulatory Visit (INDEPENDENT_AMBULATORY_CARE_PROVIDER_SITE_OTHER): Payer: Self-pay | Admitting: Adult Health

## 2021-01-18 ENCOUNTER — Other Ambulatory Visit: Payer: Self-pay

## 2021-01-18 ENCOUNTER — Encounter: Payer: Self-pay | Admitting: Adult Health

## 2021-01-18 ENCOUNTER — Telehealth: Payer: Self-pay | Admitting: Pulmonary Disease

## 2021-01-18 VITALS — BP 140/98 | HR 98 | Temp 97.9°F | Ht 69.0 in | Wt 252.2 lb

## 2021-01-18 DIAGNOSIS — J45909 Unspecified asthma, uncomplicated: Secondary | ICD-10-CM | POA: Insufficient documentation

## 2021-01-18 DIAGNOSIS — J4520 Mild intermittent asthma, uncomplicated: Secondary | ICD-10-CM

## 2021-01-18 DIAGNOSIS — J683 Other acute and subacute respiratory conditions due to chemicals, gases, fumes and vapors: Secondary | ICD-10-CM

## 2021-01-18 DIAGNOSIS — Z01811 Encounter for preprocedural respiratory examination: Secondary | ICD-10-CM | POA: Insufficient documentation

## 2021-01-18 DIAGNOSIS — G4733 Obstructive sleep apnea (adult) (pediatric): Secondary | ICD-10-CM

## 2021-01-18 NOTE — Patient Instructions (Addendum)
Combivent Inhaler as needed.  Good luck with upcoming surgery  Follow up in June 2023 to discuss starting CPAP .  Healthy sleep regimen  Do not drive if sleepy .  Work on healthy weight loss.  Activity as tolerated.  Chest xray today  Follow up with Dr. Jayme Cloud in 6 months and As needed

## 2021-01-18 NOTE — Progress Notes (Signed)
@Patient  ID: Jason Wiggins., male    DOB: 1955/04/09, 66 y.o.   MRN: [id number]  Chief Complaint  Patient presents with   Follow-up    Referring provider: Langston Reusing, NP  HPI: 66 year old male never smoker followed for asthma/reactive airways and obstructive sleep apnea. 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  History of COVID-19 in February 2021.  TEST/EVENTS :  High-resolution CT chest December 10, 2018 showed patchy areas of groundglass, architectural distortion and mild bronchiectasis.  Largely peripheral.  Mild subpleural reticulation.  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  Spirometry  December 04, 2018 showed mild restriction and reversibility.  FEV1 85%, ratio 83, FVC 77, positive bronchodilator response   01/18/2021 Follow up : Asthma , OSA  Patient returns for a 1 year follow-up.  Patient is being treated for possible underlying asthma/reactive airways. Previously on Symbicort but not used >1 year. No insurance. No flare of cough or wheezing. Previous spirometry showed minimal restriction. Uses Combivent 2 times a week.  Gets winded with hot weather. Chest xray 05/2019 showed clear lungs except for mild pleural thickening along left lateral chest .   Patient has severe obstructive sleep apnea.  Unfortunately has not been able to get a CPAP machine due to lack of insurance. Does have oxygen but does not use at home >1 year.   Does not have medical insurance . Works for [employer], got injured at work 6 months ago. Marland Kitchen Has to have left shoulder surgery in next couple of months. Spoke to [facility] advisor . Needs paperwork completed.   Lives at home with sister. Does not drive, does not have license. Able to do ADLS and light activities. Never smoker. Drinks 2 beers daily   No fare of cough or wheeze.    No Known Allergies  Immunization History  Administered Date(s) Administered   PFIZER(Purple Top)SARS-COV-2  Vaccination 04/27/2019, 05/22/2019    Past Medical History:  Diagnosis Date   Alcohol abuse    possibly as much as 24 beers per day,in past   Arthritis    COVID-19 virus infection 02/2019   History of echocardiogram    a. 10/2018 Echo: EF 55-60%, borderline LVH. Diast dysfxn. No significant valvular dzs.   HTN (hypertension)    Nonobstructive CAD (coronary artery disease)    a. 10/2016 Cath: LM nl, LAD 5m (FFR nl @ 0.83), LCX nl, RCA nl, EF nl; b. 10/2018 MV: no ischemia/infarct. EF 55-60%. Low risk.    Tobacco History: Social History   Tobacco Use  Smoking Status Never  Smokeless Tobacco Never   Counseling given: Not Answered   Outpatient Medications Prior to Visit  Medication Sig Dispense Refill   aspirin EC 81 MG tablet Take 81 mg by mouth daily. Swallow whole.     benzonatate (TESSALON PERLES) 100 MG capsule Take 1 capsule (100 mg total) by mouth 3 (three) times daily as needed for cough. 20 capsule 0   fluticasone (FLONASE) 50 MCG/ACT nasal spray Place 2 sprays into both nostrils daily. 16 g 0   Ipratropium-Albuterol (COMBIVENT RESPIMAT) 20-100 MCG/ACT AERS respimat INHALE 1 PUFF EVERY 6 HOURS 12 g 3   nitroGLYCERIN (NITROSTAT) 0.4 MG SL tablet Place 1 tablet (0.4 mg total) under the tongue every 5 (five) minutes as needed for chest pain. 30 tablet 1   Facility-Administered Medications Prior to Visit  Medication Dose Route Frequency Provider Last Rate Last Admin   diclofenac Sodium (VOLTAREN) 1 % topical gel 2  g  2 g Topical QID PRN Elodia Florence., MD         Review of Systems:   Constitutional:   No  weight loss, night sweats,  Fevers, chills,  +fatigue, or  lassitude.  HEENT:   No headaches,  Difficulty swallowing,  Tooth/dental problems, or  Sore throat,                No sneezing, itching, ear ache, nasal congestion, post nasal drip,   CV:  No chest pain,  Orthopnea, PND, swelling in lower extremities, anasarca, dizziness, palpitations, syncope.    GI  No heartburn, indigestion, abdominal pain, nausea, vomiting, diarrhea, change in bowel habits, loss of appetite, bloody stools.   Resp:   No excess mucus, no productive cough,  No non-productive cough,  No coughing up of blood.  No change in color of mucus.  No wheezing.  No chest wall deformity  Skin: no rash or lesions.  GU: no dysuria, change in color of urine, no urgency or frequency.  No flank pain, no hematuria   MS:  No joint pain or swelling.  No decreased range of motion.  No back pain.+shoulder pain .      Physical Exam  BP (!) 140/98 (BP Location: Left Arm, Patient Position: Sitting, Cuff Size: Normal)    Pulse 98    Temp 97.9 F (36.6 C) (Oral)    Ht 5\' 9"  (1.753 m)    Wt 252 lb 3.2 oz (114.4 kg)    SpO2 95%    BMI 37.24 kg/m   GEN: A/Ox3; pleasant , NAD, well nourished    HEENT:  [place]/AT,  NOSE-clear, THROAT-clear, no lesions, no postnasal drip or exudate noted.  Class 3 MP airway   NECK:  Supple w/ fair ROM; no JVD; normal carotid impulses w/o bruits; no thyromegaly or nodules palpated; no lymphadenopathy.    RESP  Clear  P & A; w/o, wheezes/ rales/ or rhonchi. no accessory muscle use, no dullness to percussion  CARD:  RRR, no m/r/g, no peripheral edema, pulses intact, no cyanosis or clubbing.  GI:   Soft & nt; nml bowel sounds; no organomegaly or masses detected.   Musco: Warm bil, no deformities or joint swelling noted.   Neuro: alert, no focal deficits noted.    Skin: Warm, no lesions or rashes    Lab Results:    BMET     ProBNP No results found for: PROBNP  Imaging: No results found.    No flowsheet data found.  No results found for: NITRICOXIDE      Assessment & Plan:   OSA (obstructive sleep apnea) Severe obstructive sleep apnea no CPAP therapy due to lack of insurance.  Patient says he will be getting insurance in June of this year with Medicare.  Of advised him to follow back in our office so we can discuss this and  treatment plan with recommendations for CPAP therapy.  Patient is encouraged on healthy weight loss.  And a healthy sleep regimen.  Plan  . Patient Instructions  Combivent Inhaler as needed.  Good luck with upcoming surgery  Follow up in June 2023 to discuss starting CPAP .  Healthy sleep regimen  Do not drive if sleepy .  Work on healthy weight loss.  Activity as tolerated.  Chest xray today  Follow up with Dr. Patsey Berthold in 6 months and As needed       Asthma Mild intermittent asthma/reactive airways.  Patient with no significant  improvement on Symbicort.  Patient also is unable to afford inhalers with lack of insurance.  He does have a Combivent that he uses once or twice a week.  Appears to be well controlled without flare in symptoms.  We will check chest x-ray today.  Patient did have COVID-19 infection in the past with some postinflammatory scarring noted.  Chest x-ray May 2021 showed some mild pleural thickening otherwise clear lungs. He is a never smoker.  Is fully independent.  Plan  Patient Instructions  Combivent Inhaler as needed.  Good luck with upcoming surgery  Follow up in June 2023 to discuss starting CPAP .  Healthy sleep regimen  Do not drive if sleepy .  Work on healthy weight loss.  Activity as tolerated.  Chest xray today  Follow up with Dr. Patsey Berthold in 6 months and As needed       Preop pulmonary/respiratory exam Preop pulmonary risk assessment.  Patient is a mild to moderate risk with underlying asthma and obstructive sleep apnea.  Unfortunately his sleep apnea is not controlled as he does not have CPAP machine.  Recommend that CPAP be available in the postop setting if indicated.  Patient is advised to use caution if using any type of sedating medications.   Major Pulmonary risks identified in the multifactorial risk analysis are but not limited to a) pneumonia; b) recurrent intubation risk; c) prolonged or recurrent acute respiratory failure needing  mechanical ventilation; d) prolonged hospitalization; e) DVT/Pulmonary embolism; f) Acute Pulmonary edema  Recommend 1. Short duration of surgery as much as possible and avoid paralytic if possible  DVT prophylaxis if indicated. ,   Aggressive pulmonary toilet with o2, bronchodilatation, and incentive spirometry and early ambulation CPAP if indicated.  Use caution with sedation medications .       Rexene Edison, NP 01/18/2021

## 2021-01-18 NOTE — Assessment & Plan Note (Signed)
Preop pulmonary risk assessment.  Patient is a mild to moderate risk with underlying asthma and obstructive sleep apnea.  Unfortunately his sleep apnea is not controlled as he does not have CPAP machine.  Recommend that CPAP be available in the postop setting if indicated.  Patient is advised to use caution if using any type of sedating medications.   Major Pulmonary risks identified in the multifactorial risk analysis are but not limited to a) pneumonia; b) recurrent intubation risk; c) prolonged or recurrent acute respiratory failure needing mechanical ventilation; d) prolonged hospitalization; e) DVT/Pulmonary embolism; f) Acute Pulmonary edema  Recommend 1. Short duration of surgery as much as possible and avoid paralytic if possible  DVT prophylaxis if indicated. ,   Aggressive pulmonary toilet with o2, bronchodilatation, and incentive spirometry and early ambulation CPAP if indicated.  Use caution with sedation medications .

## 2021-01-18 NOTE — Telephone Encounter (Signed)
Tammy, please see below message. Thanks ?

## 2021-01-18 NOTE — Telephone Encounter (Signed)
We do Pulmonary risk assessments for surgery  He is a mild to moderate risk with controlled Asthma and untreated OSA.  My recommendations are in the note.  He is okay to proceed with surgery from a pulmonary standpoint at a mild to moderate risk .

## 2021-01-18 NOTE — Assessment & Plan Note (Signed)
Mild intermittent asthma/reactive airways.  Patient with no significant improvement on Symbicort.  Patient also is unable to afford inhalers with lack of insurance.  He does have a Combivent that he uses once or twice a week.  Appears to be well controlled without flare in symptoms.  We will check chest x-ray today.  Patient did have COVID-19 infection in the past with some postinflammatory scarring noted.  Chest x-ray May 2021 showed some mild pleural thickening otherwise clear lungs. He is a never smoker.  Is fully independent.  Plan  Patient Instructions  Combivent Inhaler as needed.  Good luck with upcoming surgery  Follow up in June 2023 to discuss starting CPAP .  Healthy sleep regimen  Do not drive if sleepy .  Work on healthy weight loss.  Activity as tolerated.  Chest xray today  Follow up with Dr. Jayme Cloud in 6 months and As needed

## 2021-01-18 NOTE — Assessment & Plan Note (Signed)
Severe obstructive sleep apnea no CPAP therapy due to lack of insurance.  Patient says he will be getting insurance in June of this year with Medicare.  Of advised him to follow back in our office so we can discuss this and treatment plan with recommendations for CPAP therapy.  Patient is encouraged on healthy weight loss.  And a healthy sleep regimen.  Plan  . Patient Instructions  Combivent Inhaler as needed.  Good luck with upcoming surgery  Follow up in June 2023 to discuss starting CPAP .  Healthy sleep regimen  Do not drive if sleepy .  Work on healthy weight loss.  Activity as tolerated.  Chest xray today  Follow up with Dr. Patsey Berthold in 6 months and As needed

## 2021-01-19 NOTE — Telephone Encounter (Signed)
Mandi, do you have an assessment form for this patient?

## 2021-01-19 NOTE — Telephone Encounter (Signed)
Case worker calling in regards to patient surgical clearance. This information can be faxed to [phone]

## 2021-01-19 NOTE — Telephone Encounter (Addendum)
Per Halford Chessman, AVS has been faxed to caseworker mentioning upcoming surgery. Caseworker will fax assessment form once received by surgeon.

## 2021-01-20 NOTE — Progress Notes (Signed)
Agree with the details of the visit as noted by Tammy Parrett, NP.  C. Laura Deena Shaub, MD [facility] 

## 2021-01-20 NOTE — Telephone Encounter (Signed)
I have not received a surgical clearance form on this patient

## 2021-01-22 NOTE — Telephone Encounter (Signed)
Still awaiting clearance form. I do not have a contact number for caseworker to call for update.

## 2021-01-26 NOTE — Telephone Encounter (Signed)
ATC patient to obtain contact info for case worker. Unable to leave vm due to mailbox not being setup.

## 2021-01-27 NOTE — Telephone Encounter (Signed)
ATC x2--unable to leave vm due to mailbox not being setup. Will close encounter per office protocol.   

## 2021-02-09 NOTE — Progress Notes (Signed)
Cardiology Office Note:    Date:  02/10/2021   ID:  Jason Logan., DOB October 21, 1955, MRN [id number]  PCP:  Langston Reusing, NP  [facility] Cardiologist:  Kate Sable, MD  [facility] Electrophysiologist:  None   Referring MD: Langston Reusing, NP   Chief Complaint: Cardiac clearance  History of Present Illness:    Jason Torrealba. is a 66 y.o. male with a hx of CAD with 60% LAD disease by cath in 2018, HTN, HLD, OSA, obesity who presents for cardiac evaluation.   Seen 01/29/20 for chest pain. EKG was unremarkable. He was sent for stat labs which were normal.   Echo 02/03/20 showed LVEF 55-60%, no WMA, mild LVH, normal RV function.  Today, the patient presents for cardiac eval prior to rotator cuff repair on the left shoulder. He injured it at work 10/05/20 and this is a workers comp case. Does not have a date scheduled yet for the surgery. Patient is overall doing well form a cardiac perspective. Has some chronic shortness of breath, worse since stopping work. Also has chronic lung issues from PNA, COVID, bronchitis that occurred 3-4 years ago. He follows with pulmonology. Says he has OSA, but does not have CPAP since he doesn't have insurance. He is turing 66 in June, this will help with medical treatment. No chest pain , LLE, orthopnea, pnd. He uses O2 occasionally at home. EKG showss ST 102bpm. Sitting in the room heart rate int he 90s. He didn't tolerate Imdur due to headache and stopped the statin himself. He can walk 1-2 blocks, up a flight of stairs. Cannot due yard work due to arm pain. No h/o DM, stroke.    Past Medical History:  Diagnosis Date   Alcohol abuse    possibly as much as 24 beers per day,in past   Arthritis    COVID-19 virus infection 02/2019   History of echocardiogram    a. 10/2018 Echo: EF 55-60%, borderline LVH. Diast dysfxn. No significant valvular dzs.   HTN (hypertension)    Nonobstructive CAD (coronary artery disease)    a. 10/2016 Cath:  LM nl, LAD 40m (FFR nl @ 0.83), LCX nl, RCA nl, EF nl; b. 10/2018 MV: no ischemia/infarct. EF 55-60%. Low risk.    Past Surgical History:  Procedure Laterality Date   CARDIAC CATHETERIZATION     HERNIA REPAIR     Right inguinal   INTRAVASCULAR PRESSURE WIRE/FFR STUDY N/A 10/28/2016   Procedure: INTRAVASCULAR PRESSURE WIRE/FFR STUDY;  Surgeon: Leonie Man, MD;  Location: [facility];  Service: Cardiovascular;  Laterality: N/A;   LEFT HEART CATH AND CORONARY ANGIOGRAPHY N/A 10/28/2016   Procedure: LEFT HEART CATH AND CORONARY ANGIOGRAPHY;  Surgeon: Leonie Man, MD;  Location: [facility];  Service: Cardiovascular;  Laterality: N/A;    Current Medications: Current Meds  Medication Sig   aspirin EC 81 MG tablet Take 81 mg by mouth daily. Swallow whole.   Current Facility-Administered Medications for the 02/10/21 encounter (Office Visit) with Kathlen Mody, Loula Marcella H, PA-C  Medication   diclofenac Sodium (VOLTAREN) 1 % topical gel 2 g     Allergies:   Patient has no known allergies.   Social History   Socioeconomic History   Marital status: Legally Separated    Spouse name: Not on file   Number of children: 2   Years of education: Not on file   Highest education level: Not on file  Occupational History   Not on  file  Tobacco Use   Smoking status: Never   Smokeless tobacco: Never  Vaping Use   Vaping Use: Never used  Substance and Sexual Activity   Alcohol use: Yes    Comment: 6 pack a day   Drug use: No   Sexual activity: Yes    Birth control/protection: Condom  Other Topics Concern   Not on file  Social History Narrative   Lives with sister.  Lost his job.     Social Determinants of Health   Financial Resource Strain: Not on file  Food Insecurity: Not on file  Transportation Needs: Not on file  Physical Activity: Not on file  Stress: Not on file  Social Connections: Not on file     Family History: The patient's family history includes Cancer in  his father and mother.  ROS:   Please see the history of present illness.     All other systems reviewed and are negative.  EKGs/Labs/Other Studies Reviewed:    The following studies were reviewed today:  Echo 02/03/20  1. Left ventricular ejection fraction, by estimation, is 55 to 60%. The  left ventricle has normal function. The left ventricle has no regional  wall motion abnormalities. There is mild left ventricular hypertrophy.  Left ventricular diastolic parameters  were normal. The average left ventricular global longitudinal strain is  -14.2 %. The global longitudinal strain is abnormal.   2. Right ventricular systolic function is normal. The right ventricular  size is normal. There is normal pulmonary artery systolic pressure.   3. Left atrial size was mildly dilated.   4. The mitral valve is normal in structure. No evidence of mitral valve  regurgitation. No evidence of mitral stenosis.   5. The aortic valve is normal in structure. Aortic valve regurgitation is  not visualized. No aortic stenosis is present.    Cardiac cath 2018 Mid LAD lesion, 60 %stenosed. moderate lesion, not physiologically significant based on FFR of 0.83 The left ventricular systolic function is normal. LV end diastolic pressure is normal.   Essentially normal coronary arteries with the exception of one single vessel disease that is moderate in nature. Angiographically and physiologically moderate based on FFR.   Cannot exclude hypertension related supply versus demand ischemia with moderate lesion.   Plan:  The patient will be transferred back to short stay holding area and discharge after bedrest/TR band removal. I will start 2.5 mg amlodipine for discharge.  EKG:  EKG is  ordered today.  The ekg ordered today demonstrates ST, 102bpm, LAD LAFB, poor R wave progression  Recent Labs: 03/11/2020: BUN 15; Creatinine, Ser 0.72; Hemoglobin 15.0; Platelets 164; Sodium 141 07/30/2020: Potassium 3.9   Recent Lipid Panel    Component Value Date/Time   CHOL 195 02/13/2019 1147   TRIG 412 (H) 02/13/2019 1147   HDL 43 02/13/2019 1147   CHOLHDL 4.5 02/13/2019 1147   CHOLHDL 3.9 04/23/2018 0448   VLDL 20 04/23/2018 0448   LDLCALC 85 02/13/2019 1147      Physical Exam:    VS:  BP 128/90 (BP Location: Right Arm, Patient Position: Sitting, Cuff Size: Normal)    Pulse (!) 102    Ht 5\' 9"  (1.753 m)    Wt 252 lb 2 oz (114.4 kg)    SpO2 98%    BMI 37.23 kg/m     Wt Readings from Last 3 Encounters:  02/10/21 252 lb 2 oz (114.4 kg)  01/18/21 252 lb 3.2 oz (114.4 kg)  08/06/20 243  lb 6.4 oz (110.4 kg)     GEN:  Well nourished, well developed in no acute distress HEENT: Normal NECK: No JVD; No carotid bruits LYMPHATICS: No lymphadenopathy CARDIAC: RRR, no murmurs, rubs, gallops RESPIRATORY:  Clear to auscultation without rales, wheezing or rhonchi  ABDOMEN: Soft, non-tender, non-distended MUSCULOSKELETAL:  No edema; No deformity  SKIN: Warm and dry NEUROLOGIC:  Alert and oriented x 3 PSYCHIATRIC:  Normal affect   ASSESSMENT:    1. Pre-operative cardiovascular examination   2. Coronary artery disease involving native coronary artery of native heart with other form of angina pectoris ([place])    PLAN:    In order of problems listed above:  Cardiac evaluation prior to L shoulder surgery Patient suffered a left shoulder injury at work 09/2020, he tore rotator cuff muscles and they planning for surgical repair, no date has been scheduled. From a cardiac perspective he is doing well. He has chronic SOB from multiple lungs issues (PNA, COVID, bronchitis, OSA) that is unchanged for many years.He follows with pulmonology. He uses O2 at night as needed, he is planning on getting CPAP machine once he has insurance in June 2023. No chest pain, LLE, pnd, orthopnea. EKG shows sinus tchycardia with rate 102bpm, on pulse ox heart rate in the 90s. No ST/Twave changes. Echo 02/03/20 showed LVEF 55-60%,  no WMA, mild LVH, normal RV function. METS>4. According to the Revised cardiac index he is Class 2 risk at 6 % 30 day risk of death, MI, or cardiac arrest. Would consider restarting statin and adding BB, however given patient does not have insurance, and workers comp will only pay for surgery related things, this can wait until follow-up. No further cardiac work-up prior to surgery. We recommend continue Aspirin during the perioperative period given history of CAD.   Disposition: Follow up in 6 month(s) with MD/APP    Signed, Terrie Grajales Ninfa Meeker, PA-C  02/10/2021 12:43 PM    [facility]

## 2021-02-10 ENCOUNTER — Encounter: Payer: Self-pay | Admitting: Emergency Medicine

## 2021-02-10 ENCOUNTER — Other Ambulatory Visit: Payer: Self-pay

## 2021-02-10 ENCOUNTER — Encounter: Payer: Self-pay | Admitting: Medical

## 2021-02-10 ENCOUNTER — Ambulatory Visit (INDEPENDENT_AMBULATORY_CARE_PROVIDER_SITE_OTHER): Payer: Worker's Compensation | Admitting: Medical

## 2021-02-10 VITALS — BP 128/90 | HR 102 | Ht 69.0 in | Wt 252.1 lb

## 2021-02-10 DIAGNOSIS — I25118 Atherosclerotic heart disease of native coronary artery with other forms of angina pectoris: Secondary | ICD-10-CM

## 2021-02-10 DIAGNOSIS — Z01810 Encounter for preprocedural cardiovascular examination: Secondary | ICD-10-CM | POA: Diagnosis not present

## 2021-02-10 NOTE — Progress Notes (Signed)
Letter for surgical clearance

## 2021-02-10 NOTE — Patient Instructions (Signed)
Medication Instructions:  °Your physician recommends that you continue on your current medications as directed. Please refer to the Current Medication list given to you today.  ° °*If you need a refill on your cardiac medications before your next appointment, please call your pharmacy* ° ° °Lab Work: °None ordered ° °If you have labs (blood work) drawn today and your tests are completely normal, you will receive your results only by: °MyChart Message (if you have MyChart) OR °A paper copy in the mail °If you have any lab test that is abnormal or we need to change your treatment, we will call you to review the results. ° ° °Testing/Procedures: °None ordered ° ° °Follow-Up: °At [facility], you and your health needs are our priority.  As part of our continuing mission to provide you with exceptional heart care, we have created designated Provider Care Teams.  These Care Teams include your primary Cardiologist (physician) and Advanced Practice Providers (APPs -  Physician Assistants and Nurse Practitioners) who all work together to provide you with the care you need, when you need it. ° °We recommend signing up for the patient portal called "MyChart".  Sign up information is provided on this After Visit Summary.  MyChart is used to connect with patients for Virtual Visits (Telemedicine).  Patients are able to view lab/test results, encounter notes, upcoming appointments, etc.  Non-urgent messages can be sent to your provider as well.   °To learn more about what you can do with MyChart, go to [email or web address].   ° °Your next appointment:   °6 month(s) ° °The format for your next appointment:   °In Person ° °Provider:   °You may see Brian Agbor-Etang, MD or one of the following Advanced Practice Providers on your designated Care Team:   °Christopher Berge, NP °Ryan Dunn, PA-C °Cadence Furth, PA-C  :1}  ° ° °Other Instructions °N/A ° °

## 2021-02-11 ENCOUNTER — Telehealth: Payer: Self-pay | Admitting: Physician Assistant

## 2021-02-11 NOTE — Telephone Encounter (Signed)
This cardiac clearance was addressed yesterday during the office visit.  I have forwarded the office note to the surgeon's office.

## 2021-02-11 NOTE — Telephone Encounter (Signed)
° °  Pre-operative Risk Assessment    Patient Name: Jason Wiggins.  DOB: 04/16/1955 MRN: [id number]      Request for Surgical Clearance    Procedure:   L Shoulder arthroscopy rotator cuff repair vs rotator cuff debrid subacromial  decompression   Date of Surgery:  Clearance TBD                                 Surgeon:  Claretha Cooper Group or Practice Name:  [facility]  Phone number:  [phone] Fax number:  [phone]   Type of Clearance Requested:   - Medical  - Pharmacy:  Hold    please advise    Type of Anesthesia:   choice    Additional requests/questions:    [name]   02/11/2021, 4:15 PM

## 2021-02-15 ENCOUNTER — Other Ambulatory Visit: Payer: Self-pay | Admitting: Orthopedic Surgery

## 2021-02-15 NOTE — Progress Notes (Signed)
Surgery orders requested via Epic inbox. °

## 2021-02-17 NOTE — Patient Instructions (Addendum)
DUE TO COVID-19 ONLY ONE VISITOR IS ALLOWED TO COME WITH YOU AND STAY IN THE WAITING ROOM ONLY DURING PRE OP AND PROCEDURE.   **NO VISITORS ARE ALLOWED IN THE SHORT STAY AREA OR RECOVERY ROOM!!**  IF YOU WILL BE ADMITTED INTO THE HOSPITAL YOU ARE ALLOWED ONLY TWO SUPPORT PEOPLE DURING VISITATION HOURS ONLY (7 AM -8PM)   The support person(s) must pass our screening, gel in and out, and wear a mask at all times, including in the patients room. Patients must also wear a mask when staff or their support person are in the room. Visitors GUEST BADGE MUST BE WORN VISIBLY  One adult visitor may remain with you overnight and MUST be in the room by 8 P.M.  No visitors under the age of 65. Any visitor under the age of 65 must be accompanied by an adult.        Your procedure is scheduled on: 02/22/21   [facility]    Report to admitting at : 10:15 AM   Call this number if you have problems the morning of surgery [phone]   Do not eat food :After Midnight.   May have liquids until : 9:30 AM   day of surgery  CLEAR LIQUID DIET  Foods Allowed                                                                     Foods Excluded  Water, Black Coffee and tea, regular and decaf                             liquids that you cannot  Plain Jell-O in any flavor  (No red)                                           see through such as: Fruit ices (not with fruit pulp)                                     milk, soups, orange juice              Iced Popsicles (No red)                                    All solid food                                   Apple juices Sports drinks like Gatorade (No red) Lightly seasoned clear broth or consume(fat free) Sugar Sample Menu Breakfast                                Lunch  Supper Cranberry juice                    Beef broth                            Chicken broth Jell-O                                      Grape juice                           Apple juice Coffee or tea                        Jell-O                                      Popsicle                                                Coffee or tea                        Coffee or tea     Complete one Ensure drink the morning of surgery 3 hours prior to scheduled surgery at: 9:30 AM.    The day of surgery:  Drink ONE (1) Pre-Surgery Clear Ensure or G2 at AM the morning of surgery. Drink in one sitting. Do not sip.  This drink was given to you during your hospital  pre-op appointment visit. Nothing else to drink after completing the  Pre-Surgery Clear Ensure or G2.          If you have questions, please contact your surgeons office.    Oral Hygiene is also important to reduce your risk of infection.                                    Remember - BRUSH YOUR TEETH THE MORNING OF SURGERY WITH YOUR REGULAR TOOTHPASTE   Do NOT smoke after Midnight   Take these medicines the morning of surgery with A SIP OF WATER: N/A. Flonase and inhalers as usual.  DO NOT TAKE ANY ORAL DIABETIC MEDICATIONS DAY OF YOUR SURGERY                              You may not have any metal on your body including hair pins, jewelry, and body piercing             Do not wear lotions, powders, perfumes/cologne, or deodorant               Men may shave face and neck.   Do not bring valuables to the hospital. [facility] IS NOT             RESPONSIBLE   FOR VALUABLES.   Contacts, dentures or bridgework may not be worn into surgery.   Bring small overnight bag day of surgery.    Patients discharged on  the day of surgery will not be allowed to drive home.  Someone needs to stay with you for the first 24 hours after anesthesia.   Special Instructions: Bring a copy of your healthcare power of attorney and living will documents         the day of surgery if you haven't scanned them before.              Please read over the following fact sheets you were  given: IF YOU HAVE QUESTIONS ABOUT YOUR PRE-OP INSTRUCTIONS PLEASE CALL [phone]     [facility] - Preparing for Surgery Before surgery, you can play an important role.  Because skin is not sterile, your skin needs to be as free of germs as possible.  You can reduce the number of germs on your skin by washing with CHG (chlorahexidine gluconate) soap before surgery.  CHG is an antiseptic cleaner which kills germs and bonds with the skin to continue killing germs even after washing. Please DO NOT use if you have an allergy to CHG or antibacterial soaps.  If your skin becomes reddened/irritated stop using the CHG and inform your nurse when you arrive at Short Stay. Do not shave (including legs and underarms) for at least 48 hours prior to the first CHG shower.  You may shave your face/neck. Please follow these instructions carefully:  1.  Shower with CHG Soap the night before surgery and the  morning of Surgery.  2.  If you choose to wash your hair, wash your hair first as usual with your  normal  shampoo.  3.  After you shampoo, rinse your hair and body thoroughly to remove the  shampoo.                           4.  Use CHG as you would any other liquid soap.  You can apply chg directly  to the skin and wash                       Gently with a scrungie or clean washcloth.  5.  Apply the CHG Soap to your body ONLY FROM THE NECK DOWN.   Do not use on face/ open                           Wound or open sores. Avoid contact with eyes, ears mouth and genitals (private parts).                       Wash face,  Genitals (private parts) with your normal soap.             6.  Wash thoroughly, paying special attention to the area where your surgery  will be performed.  7.  Thoroughly rinse your body with warm water from the neck down.  8.  DO NOT shower/wash with your normal soap after using and rinsing off  the CHG Soap.                9.  Pat yourself dry with a clean towel.            10.  Wear clean  pajamas.            11.  Place clean sheets on your bed the night of your first shower and do not  sleep with pets. Day of  Surgery : Do not apply any lotions/deodorants the morning of surgery.  Please wear clean clothes to the hospital/surgery center.  FAILURE TO FOLLOW THESE INSTRUCTIONS MAY RESULT IN THE CANCELLATION OF YOUR SURGERY PATIENT SIGNATURE_________________________________  NURSE SIGNATURE__________________________________  ________________________________________________________________________   Jason Wiggins  An incentive spirometer is a tool that can help keep your lungs clear and active. This tool measures how well you are filling your lungs with each breath. Taking long deep breaths may help reverse or decrease the chance of developing breathing (pulmonary) problems (especially infection) following: A long period of time when you are unable to move or be active. BEFORE THE PROCEDURE  If the spirometer includes an indicator to show your best effort, your nurse or respiratory therapist will set it to a desired goal. If possible, sit up straight or lean slightly forward. Try not to slouch. Hold the incentive spirometer in an upright position. INSTRUCTIONS FOR USE  Sit on the edge of your bed if possible, or sit up as far as you can in bed or on a chair. Hold the incentive spirometer in an upright position. Breathe out normally. Place the mouthpiece in your mouth and seal your lips tightly around it. Breathe in slowly and as deeply as possible, raising the piston or the ball toward the top of the column. Hold your breath for 3-5 seconds or for as long as possible. Allow the piston or ball to fall to the bottom of the column. Remove the mouthpiece from your mouth and breathe out normally. Rest for a few seconds and repeat Steps 1 through 7 at least 10 times every 1-2 hours when you are awake. Take your time and take a few normal breaths between deep breaths. The  spirometer may include an indicator to show your best effort. Use the indicator as a goal to work toward during each repetition. After each set of 10 deep breaths, practice coughing to be sure your lungs are clear. If you have an incision (the cut made at the time of surgery), support your incision when coughing by placing a pillow or rolled up towels firmly against it. Once you are able to get out of bed, walk around indoors and cough well. You may stop using the incentive spirometer when instructed by your caregiver.  RISKS AND COMPLICATIONS Take your time so you do not get dizzy or light-headed. If you are in pain, you may need to take or ask for pain medication before doing incentive spirometry. It is harder to take a deep breath if you are having pain. AFTER USE Rest and breathe slowly and easily. It can be helpful to keep track of a log of your progress. Your caregiver can provide you with a simple table to help with this. If you are using the spirometer at home, follow these instructions: SEEK MEDICAL CARE IF:  You are having difficultly using the spirometer. You have trouble using the spirometer as often as instructed. Your pain medication is not giving enough relief while using the spirometer. You develop fever of 100.5 F (38.1 C) or higher. SEEK IMMEDIATE MEDICAL CARE IF:  You cough up bloody sputum that had not been present before. You develop fever of 102 F (38.9 C) or greater. You develop worsening pain at or near the incision site. MAKE SURE YOU:  Understand these instructions. Will watch your condition. Will get help right away if you are not doing well or get worse. Document Released: 05/09/2006 Document Revised: 03/21/2011 Document Reviewed: 07/10/2006 ExitCare Patient  Information 2014 [employer] [place].   ________________________________________________________________________

## 2021-02-18 ENCOUNTER — Encounter (HOSPITAL_COMMUNITY): Payer: Self-pay

## 2021-02-18 ENCOUNTER — Other Ambulatory Visit: Payer: Self-pay

## 2021-02-18 ENCOUNTER — Encounter (HOSPITAL_COMMUNITY)
Admission: RE | Admit: 2021-02-18 | Discharge: 2021-02-18 | Disposition: A | Discharge status: Home / Self Care | Payer: Worker's Compensation | Source: Ambulatory Visit | Attending: Orthopedic Surgery | Admitting: Orthopedic Surgery

## 2021-02-18 VITALS — BP 155/99 | HR 96 | Temp 98.4°F | Ht 69.0 in | Wt 249.0 lb

## 2021-02-18 DIAGNOSIS — I252 Old myocardial infarction: Secondary | ICD-10-CM | POA: Insufficient documentation

## 2021-02-18 DIAGNOSIS — M75102 Unspecified rotator cuff tear or rupture of left shoulder, not specified as traumatic: Secondary | ICD-10-CM | POA: Insufficient documentation

## 2021-02-18 DIAGNOSIS — I2510 Atherosclerotic heart disease of native coronary artery without angina pectoris: Secondary | ICD-10-CM | POA: Insufficient documentation

## 2021-02-18 DIAGNOSIS — Z8616 Personal history of COVID-19: Secondary | ICD-10-CM | POA: Diagnosis not present

## 2021-02-18 DIAGNOSIS — I10 Essential (primary) hypertension: Secondary | ICD-10-CM | POA: Diagnosis not present

## 2021-02-18 DIAGNOSIS — Z01812 Encounter for preprocedural laboratory examination: Secondary | ICD-10-CM | POA: Diagnosis present

## 2021-02-18 HISTORY — DX: Pneumonia, unspecified organism: J18.9

## 2021-02-18 HISTORY — DX: Acute myocardial infarction, unspecified: I21.9

## 2021-02-18 LAB — CBC
HCT: 41.6 % (ref 39.0–52.0)
Hemoglobin: 14.7 g/dL (ref 13.0–17.0)
MCH: 34.1 pg — ABNORMAL HIGH (ref 26.0–34.0)
MCHC: 35.3 g/dL (ref 30.0–36.0)
MCV: 96.5 fL (ref 80.0–100.0)
Platelets: 170 10*3/uL (ref 150–400)
RBC: 4.31 MIL/uL (ref 4.22–5.81)
RDW: 12.1 % (ref 11.5–15.5)
WBC: 7.2 10*3/uL (ref 4.0–10.5)
nRBC: 0 % (ref 0.0–0.2)

## 2021-02-18 LAB — BASIC METABOLIC PANEL
Anion gap: 8 (ref 5–15)
BUN: 19 mg/dL (ref 8–23)
CO2: 24 mmol/L (ref 22–32)
Calcium: 9.2 mg/dL (ref 8.9–10.3)
Chloride: 108 mmol/L (ref 98–111)
Creatinine, Ser: 0.81 mg/dL (ref 0.61–1.24)
GFR, Estimated: >60 mL/min (ref >60)
Glucose, Bld: 124 mg/dL — ABNORMAL HIGH (ref 70–99)
Potassium: 4.1 mmol/L (ref 3.5–5.1)
Sodium: 140 mmol/L (ref 135–145)

## 2021-02-18 NOTE — Anesthesia Preprocedure Evaluation (Addendum)
Anesthesia Evaluation  Patient identified by MRN, date of birth, ID band Patient awake    Reviewed: Allergy & Precautions, NPO status , Patient's Chart, lab work & pertinent test results  Airway Mallampati: III  TM Distance: >3 FB Neck ROM: Full    Dental no notable dental hx.    Pulmonary asthma , sleep apnea and Oxygen sleep apnea , pneumonia, resolved,    Pulmonary exam normal breath sounds clear to auscultation       Cardiovascular hypertension, + CAD and + Past MI  Normal cardiovascular exam Rhythm:Regular Rate:Normal     Neuro/Psych PSYCHIATRIC DISORDERS negative neurological ROS     GI/Hepatic negative GI ROS, (+)     substance abuse  alcohol use,   Endo/Other  negative endocrine ROS  Renal/GU negative Renal ROS     Musculoskeletal  (+) Arthritis ,   Abdominal (+) + obese,   Peds  Hematology negative hematology ROS (+)   Anesthesia Other Findings LEFT SHOULDER ROTATOR CUFF TEAR  Reproductive/Obstetrics                          Anesthesia Physical Anesthesia Plan  ASA: 4  Anesthesia Plan: General   Post-op Pain Management:    Induction: Intravenous  PONV Risk Score and Plan: 2 and Ondansetron, Dexamethasone, Midazolam and Treatment may vary due to age or medical condition  Airway Management Planned: Oral ETT  Additional Equipment:   Intra-op Plan:   Post-operative Plan: Extubation in OR  Informed Consent: I have reviewed the patients History and Physical, chart, labs and discussed the procedure including the risks, benefits and alternatives for the proposed anesthesia with the patient or authorized representative who has indicated his/her understanding and acceptance.     Dental advisory given  Plan Discussed with: CRNA  Anesthesia Plan Comments: (Reviewed PAT note 02/18/2021, Jodell Cipro Ward, PA-C Regional anesthesia offered to and declined by patient)     Anesthesia Quick Evaluation

## 2021-02-18 NOTE — Progress Notes (Signed)
COVID Vaccine Completed: Yes Date COVID Vaccine completed: 05/22/19 x 2 COVID vaccine manufacturer: Pfizer     COVID Test: N/A Bowel prep reminder: N/A  PCP - Eulogio Bear: NP Cardiologist - Dr. Vella Redhead Agbor. LOV: 02/17/21 : Clearance: Azalee Course: PA: 02/17/21  Chest x-ray - 01/18/21 EKG - 03/13/20 Stress Test -  ECHO - 02/03/20 Cardiac Cath -  Pacemaker/ICD device last checked:  Sleep Study -  CPAP -   Fasting Blood Sugar -  Checks Blood Sugar _____ times a day  Blood Thinner Instructions: Aspirin Instructions: Last Dose:  Anesthesia review: Hx: OSA(NO CPAP),HTN,CAD,Heart attack. Pt. Uses 2L O2 PRN at home.  Patient denies shortness of breath, fever, cough and chest pain at PAT appointment   Patient verbalized understanding of instructions that were given to them at the PAT appointment. Patient was also instructed that they will need to review over the PAT instructions again at home before surgery.

## 2021-02-18 NOTE — Progress Notes (Signed)
Anesthesia Chart Review   Case: [id number] Date/Time: 02/22/21 1220   Procedure: SHOULDER ARTHROSCOPY WITH ROTATOR CUFF REPAIR VERSUS ROTATOR CUFF DEBRIDEMENT AND SUBACROMIAL DECOMPRESSION (Left)   Anesthesia type: Choice   Pre-op diagnosis: LEFT SHOULDER ROTATOR CUFF TEAR   Location: [facility] ROOM 07 / [facility]   Surgeons: Jones Broom, MD       DISCUSSION:65 y.o. never smoker with h/o HTN, CAD with 60% LAD disease by cath in 2018, alcohol abuse, left shoulder rotator cuff tear scheduled for above procedure 02/22/2021 with Dr. Jones Broom.   Seen by cardiology 02/10/2021. Per OV note, "Patient suffered a left shoulder injury at work 09/2020, he tore rotator cuff muscles and they planning for surgical repair, no date has been scheduled. From a cardiac perspective he is doing well. He has chronic SOB from multiple lungs issues (PNA, COVID, bronchitis, OSA) that is unchanged for many years.He follows with pulmonology. He uses O2 at night as needed, he is planning on getting CPAP machine once he has insurance in June 2023. No chest pain, LLE, pnd, orthopnea. EKG shows sinus tchycardia with rate 102bpm, on pulse ox heart rate in the 90s. No ST/Twave changes. Echo 02/03/20 showed LVEF 55-60%, no WMA, mild LVH, normal RV function. METS>4. According to the Revised cardiac index he is Class 2 risk at 6 % 30 day risk of death, MI, or cardiac arrest. Would consider restarting statin and adding BB, however given patient does not have insurance, and workers comp will only pay for surgery related things, this can wait until follow-up. No further cardiac work-up prior to surgery. We recommend continue Aspirin during the perioperative period given history of CAD. "  Anticipate pt can proceed with planned procedure barring acute status change.   VS: BP (!) 155/99    Pulse 96    Temp 36.9 C (Oral)    Ht 5\' 9"  (1.753 m)    Wt 112.9 kg    SpO2 98%    BMI 36.77 kg/m   PROVIDERS: [place], , NP is PCP   [facility] Cardiologist:  Francoise Ceo, MD   LABS: Labs reviewed: Acceptable for surgery. (all labs ordered are listed, but only abnormal results are displayed)  Labs Reviewed  CBC - Abnormal; Notable for the following components:      Result Value   MCH 34.1 (*)    All other components within normal limits  BASIC METABOLIC PANEL - Abnormal; Notable for the following components:   Glucose, Bld 124 (*)    All other components within normal limits     IMAGES:   EKG: 02/10/21 Rate 102 bpm  Sinus tachycardia  Left anterior fascicular block  Moderate voltage criteria for LVH, may be normal variant  CV: Echo 02/03/2020  1. Left ventricular ejection fraction, by estimation, is 55 to 60%. The  left ventricle has normal function. The left ventricle has no regional  wall motion abnormalities. There is mild left ventricular hypertrophy.  Left ventricular diastolic parameters  were normal. The average left ventricular global longitudinal strain is  -14.2 %. The global longitudinal strain is abnormal.   2. Right ventricular systolic function is normal. The right ventricular  size is normal. There is normal pulmonary artery systolic pressure.   3. Left atrial size was mildly dilated.   4. The mitral valve is normal in structure. No evidence of mitral valve  regurgitation. No evidence of mitral stenosis.   5. The aortic valve is normal in structure. Aortic valve regurgitation  is  not visualized. No aortic stenosis is present.   Cardiac Cath 10/28/2016 Mid LAD lesion, 60 %stenosed. moderate lesion, not physiologically significant based on FFR of 0.83 The left ventricular systolic function is normal. LV end diastolic pressure is normal.   Essentially normal coronary arteries with the exception of one single vessel disease that is moderate in nature. Angiographically and physiologically moderate based on FFR.   Cannot exclude hypertension related supply versus demand ischemia with  moderate lesion.   Plan:  The patient will be transferred back to short stay holding area and discharge after bedrest/TR band removal. I will start 2.5 mg amlodipine for discharge. Past Medical History:  Diagnosis Date   Alcohol abuse    possibly as much as 24 beers per day,in past   Arthritis    COVID-19 virus infection 02/2019   History of echocardiogram    a. 10/2018 Echo: EF 55-60%, borderline LVH. Diast dysfxn. No significant valvular dzs.   HTN (hypertension)    Myocardial infarction (HCC)    Nonobstructive CAD (coronary artery disease)    a. 10/2016 Cath: LM nl, LAD 69m (FFR nl @ 0.83), LCX nl, RCA nl, EF nl; b. 10/2018 MV: no ischemia/infarct. EF 55-60%. Low risk.   Pneumonia     Past Surgical History:  Procedure Laterality Date   CARDIAC CATHETERIZATION     HERNIA REPAIR     Right inguinal   INTRAVASCULAR PRESSURE WIRE/FFR STUDY N/A 10/28/2016   Procedure: INTRAVASCULAR PRESSURE WIRE/FFR STUDY;  Surgeon: Marykay Lex, MD;  Location: [facility] INVASIVE CV LAB;  Service: Cardiovascular;  Laterality: N/A;   LEFT HEART CATH AND CORONARY ANGIOGRAPHY N/A 10/28/2016   Procedure: LEFT HEART CATH AND CORONARY ANGIOGRAPHY;  Surgeon: Marykay Lex, MD;  Location: [facility] INVASIVE CV LAB;  Service: Cardiovascular;  Laterality: N/A;    MEDICATIONS:  albuterol (VENTOLIN HFA) 108 (90 Base) MCG/ACT inhaler   aspirin EC 81 MG tablet   fluticasone (FLONASE) 50 MCG/ACT nasal spray   Ipratropium-Albuterol (COMBIVENT) 20-100 MCG/ACT AERS respimat    diclofenac Sodium (VOLTAREN) 1 % topical gel 2 g     [facility], PA-C [facility] Pre-Surgical Testing [phone]

## 2021-02-22 ENCOUNTER — Ambulatory Visit (HOSPITAL_COMMUNITY): Payer: Worker's Compensation | Admitting: Physician Assistant

## 2021-02-22 ENCOUNTER — Other Ambulatory Visit: Payer: Self-pay

## 2021-02-22 ENCOUNTER — Encounter (HOSPITAL_COMMUNITY): Payer: Self-pay | Admitting: Orthopedic Surgery

## 2021-02-22 ENCOUNTER — Encounter (HOSPITAL_COMMUNITY)
Admission: RE | Disposition: A | Discharge status: Home / Self Care | Payer: Self-pay | Source: Home / Self Care | Attending: Orthopedic Surgery

## 2021-02-22 ENCOUNTER — Ambulatory Visit (HOSPITAL_COMMUNITY)
Admission: RE | Admit: 2021-02-22 | Discharge: 2021-02-22 | Disposition: A | Discharge status: Home / Self Care | Payer: Worker's Compensation | Attending: Orthopedic Surgery | Admitting: Orthopedic Surgery

## 2021-02-22 ENCOUNTER — Ambulatory Visit (HOSPITAL_BASED_OUTPATIENT_CLINIC_OR_DEPARTMENT_OTHER): Payer: Worker's Compensation | Admitting: Anesthesiology

## 2021-02-22 DIAGNOSIS — J45909 Unspecified asthma, uncomplicated: Secondary | ICD-10-CM | POA: Insufficient documentation

## 2021-02-22 DIAGNOSIS — Z8616 Personal history of COVID-19: Secondary | ICD-10-CM | POA: Insufficient documentation

## 2021-02-22 DIAGNOSIS — Z79899 Other long term (current) drug therapy: Secondary | ICD-10-CM | POA: Diagnosis not present

## 2021-02-22 DIAGNOSIS — G4730 Sleep apnea, unspecified: Secondary | ICD-10-CM | POA: Diagnosis not present

## 2021-02-22 DIAGNOSIS — M75112 Incomplete rotator cuff tear or rupture of left shoulder, not specified as traumatic: Secondary | ICD-10-CM | POA: Diagnosis not present

## 2021-02-22 DIAGNOSIS — M1990 Unspecified osteoarthritis, unspecified site: Secondary | ICD-10-CM | POA: Diagnosis not present

## 2021-02-22 DIAGNOSIS — I252 Old myocardial infarction: Secondary | ICD-10-CM | POA: Insufficient documentation

## 2021-02-22 DIAGNOSIS — M25512 Pain in left shoulder: Secondary | ICD-10-CM | POA: Diagnosis present

## 2021-02-22 DIAGNOSIS — I2510 Atherosclerotic heart disease of native coronary artery without angina pectoris: Secondary | ICD-10-CM | POA: Insufficient documentation

## 2021-02-22 DIAGNOSIS — S46112A Strain of muscle, fascia and tendon of long head of biceps, left arm, initial encounter: Secondary | ICD-10-CM | POA: Diagnosis not present

## 2021-02-22 DIAGNOSIS — S43432A Superior glenoid labrum lesion of left shoulder, initial encounter: Secondary | ICD-10-CM | POA: Diagnosis not present

## 2021-02-22 DIAGNOSIS — M7542 Impingement syndrome of left shoulder: Secondary | ICD-10-CM | POA: Diagnosis not present

## 2021-02-22 DIAGNOSIS — I10 Essential (primary) hypertension: Secondary | ICD-10-CM | POA: Diagnosis not present

## 2021-02-22 HISTORY — PX: SHOULDER ARTHROSCOPY WITH ROTATOR CUFF REPAIR AND SUBACROMIAL DECOMPRESSION: SHX5686

## 2021-02-22 SURGERY — SHOULDER ARTHROSCOPY WITH ROTATOR CUFF REPAIR AND SUBACROMIAL DECOMPRESSION
Anesthesia: Regional | Laterality: Left

## 2021-02-22 MED ORDER — AMISULPRIDE (ANTIEMETIC) 5 MG/2ML IV SOLN: 10.0000 mg | Freq: Once | INTRAVENOUS | Status: DC | PRN

## 2021-02-22 MED ORDER — KETOROLAC TROMETHAMINE 30 MG/ML IJ SOLN
INTRAMUSCULAR | Status: AC
  Administered 2021-02-22: 30 mg via INTRAVENOUS
  Filled 2021-02-22: qty 1

## 2021-02-22 MED ORDER — METHOCARBAMOL 500 MG PO TABS: 500.0000 mg | ORAL_TABLET | Freq: Four times a day (QID) | ORAL | Status: DC | PRN

## 2021-02-22 MED ORDER — OXYCODONE HCL 5 MG PO TABS: 5.0000 mg | ORAL_TABLET | ORAL | Status: DC | PRN

## 2021-02-22 MED ORDER — LACTATED RINGERS IV SOLN: INTRAVENOUS | Status: DC

## 2021-02-22 MED ORDER — OXYCODONE-ACETAMINOPHEN 5-325 MG PO TABS: 1.0000 | ORAL_TABLET | ORAL | 0 refills | Status: DC | PRN

## 2021-02-22 MED ORDER — DEXAMETHASONE SODIUM PHOSPHATE 10 MG/ML IJ SOLN
INTRAMUSCULAR | Status: AC
  Filled 2021-02-22: qty 1

## 2021-02-22 MED ORDER — PROMETHAZINE HCL 25 MG/ML IJ SOLN: 6.2500 mg | INTRAMUSCULAR | Status: DC | PRN

## 2021-02-22 MED ORDER — FENTANYL CITRATE PF 50 MCG/ML IJ SOSY
50.0000 ug | PREFILLED_SYRINGE | INTRAMUSCULAR | Status: DC
  Filled 2021-02-22: qty 2

## 2021-02-22 MED ORDER — LIDOCAINE HCL (CARDIAC) PF 100 MG/5ML IV SOSY
PREFILLED_SYRINGE | INTRAVENOUS | Status: DC | PRN
  Administered 2021-02-22: 40 mg via INTRAVENOUS
  Administered 2021-02-22: 60 mg via INTRAVENOUS

## 2021-02-22 MED ORDER — 0.9 % SODIUM CHLORIDE (POUR BTL) OPTIME
TOPICAL | Status: DC | PRN
  Administered 2021-02-22: 1000 mL

## 2021-02-22 MED ORDER — PROPOFOL 10 MG/ML IV BOLUS
INTRAVENOUS | Status: DC | PRN
  Administered 2021-02-22: 200 mg via INTRAVENOUS

## 2021-02-22 MED ORDER — OXYCODONE HCL 5 MG/5ML PO SOLN: 5.0000 mg | Freq: Once | ORAL | Status: DC | PRN

## 2021-02-22 MED ORDER — KETAMINE HCL 10 MG/ML IJ SOLN
INTRAMUSCULAR | Status: AC
  Filled 2021-02-22: qty 1

## 2021-02-22 MED ORDER — CHLORHEXIDINE GLUCONATE 0.12 % MT SOLN
15.0000 mL | Freq: Once | OROMUCOSAL | Status: AC
  Administered 2021-02-22: 15 mL via OROMUCOSAL

## 2021-02-22 MED ORDER — FENTANYL CITRATE (PF) 100 MCG/2ML IJ SOLN
INTRAMUSCULAR | Status: DC | PRN
  Administered 2021-02-22 (×2): 100 ug via INTRAVENOUS

## 2021-02-22 MED ORDER — LIDOCAINE HCL (PF) 2 % IJ SOLN
INTRAMUSCULAR | Status: AC
  Filled 2021-02-22: qty 5

## 2021-02-22 MED ORDER — CEFAZOLIN SODIUM-DEXTROSE 2-4 GM/100ML-% IV SOLN
2.0000 g | INTRAVENOUS | Status: AC
  Administered 2021-02-22: 2 g via INTRAVENOUS
  Filled 2021-02-22: qty 100

## 2021-02-22 MED ORDER — METOCLOPRAMIDE HCL 5 MG/ML IJ SOLN: 5.0000 mg | Freq: Three times a day (TID) | INTRAMUSCULAR | Status: DC | PRN

## 2021-02-22 MED ORDER — ONDANSETRON HCL 4 MG PO TABS: 4.0000 mg | ORAL_TABLET | Freq: Four times a day (QID) | ORAL | Status: DC | PRN

## 2021-02-22 MED ORDER — ROCURONIUM BROMIDE 10 MG/ML (PF) SYRINGE
PREFILLED_SYRINGE | INTRAVENOUS | Status: AC
  Filled 2021-02-22: qty 10

## 2021-02-22 MED ORDER — ORAL CARE MOUTH RINSE: 15.0000 mL | Freq: Once | OROMUCOSAL | Status: AC

## 2021-02-22 MED ORDER — METHOCARBAMOL 500 MG IVPB - SIMPLE MED
INTRAVENOUS | Status: AC
  Administered 2021-02-22: 500 mg via INTRAVENOUS
  Filled 2021-02-22: qty 50

## 2021-02-22 MED ORDER — HYDROMORPHONE HCL 1 MG/ML IJ SOLN
INTRAMUSCULAR | Status: DC | PRN
  Administered 2021-02-22 (×4): .5 mg via INTRAVENOUS

## 2021-02-22 MED ORDER — KETAMINE HCL 10 MG/ML IJ SOLN
INTRAMUSCULAR | Status: DC | PRN
  Administered 2021-02-22: 40 mg via INTRAVENOUS

## 2021-02-22 MED ORDER — FENTANYL CITRATE PF 50 MCG/ML IJ SOSY
25.0000 ug | PREFILLED_SYRINGE | INTRAMUSCULAR | Status: DC | PRN
  Administered 2021-02-22: 50 ug via INTRAVENOUS

## 2021-02-22 MED ORDER — METHOCARBAMOL 500 MG PO TABS
500.0000 mg | ORAL_TABLET | Freq: Three times a day (TID) | ORAL | 0 refills | Status: DC | PRN
Start: 2021-02-22 — End: 2021-04-21

## 2021-02-22 MED ORDER — OXYCODONE HCL 5 MG PO TABS: 5.0000 mg | ORAL_TABLET | Freq: Once | ORAL | Status: DC | PRN

## 2021-02-22 MED ORDER — FENTANYL CITRATE (PF) 100 MCG/2ML IJ SOLN
INTRAMUSCULAR | Status: AC
  Filled 2021-02-22: qty 2

## 2021-02-22 MED ORDER — METOCLOPRAMIDE HCL 5 MG PO TABS: 5.0000 mg | ORAL_TABLET | Freq: Three times a day (TID) | ORAL | Status: DC | PRN

## 2021-02-22 MED ORDER — ONDANSETRON HCL 4 MG/2ML IJ SOLN
INTRAMUSCULAR | Status: DC | PRN
  Administered 2021-02-22: 4 mg via INTRAVENOUS

## 2021-02-22 MED ORDER — OXYCODONE HCL 5 MG PO TABS: 10.0000 mg | ORAL_TABLET | ORAL | Status: DC | PRN

## 2021-02-22 MED ORDER — MIDAZOLAM HCL 5 MG/5ML IJ SOLN
INTRAMUSCULAR | Status: DC | PRN
Start: 2021-02-22 — End: 2021-02-22
  Administered 2021-02-22: 2 mg via INTRAVENOUS

## 2021-02-22 MED ORDER — KETOROLAC TROMETHAMINE 30 MG/ML IJ SOLN: 30.0000 mg | Freq: Once | INTRAMUSCULAR | Status: AC

## 2021-02-22 MED ORDER — SUGAMMADEX SODIUM 200 MG/2ML IV SOLN
INTRAVENOUS | Status: DC | PRN
  Administered 2021-02-22: 200 mg via INTRAVENOUS

## 2021-02-22 MED ORDER — ONDANSETRON HCL 4 MG/2ML IJ SOLN
INTRAMUSCULAR | Status: AC
  Filled 2021-02-22: qty 2

## 2021-02-22 MED ORDER — ACETAMINOPHEN 10 MG/ML IV SOLN
INTRAVENOUS | Status: AC
  Filled 2021-02-22: qty 100

## 2021-02-22 MED ORDER — MIDAZOLAM HCL 2 MG/2ML IJ SOLN
1.0000 mg | INTRAMUSCULAR | Status: DC
  Filled 2021-02-22: qty 2

## 2021-02-22 MED ORDER — PROPOFOL 10 MG/ML IV BOLUS
INTRAVENOUS | Status: AC
  Filled 2021-02-22: qty 20

## 2021-02-22 MED ORDER — LABETALOL HCL 5 MG/ML IV SOLN
INTRAVENOUS | Status: AC
  Filled 2021-02-22: qty 4

## 2021-02-22 MED ORDER — HYDROMORPHONE HCL 1 MG/ML IJ SOLN: 0.5000 mg | INTRAMUSCULAR | Status: DC | PRN

## 2021-02-22 MED ORDER — ACETAMINOPHEN 10 MG/ML IV SOLN
1000.0000 mg | Freq: Once | INTRAVENOUS | Status: DC | PRN
  Administered 2021-02-22: 1000 mg via INTRAVENOUS

## 2021-02-22 MED ORDER — ONDANSETRON HCL 4 MG/2ML IJ SOLN: 4.0000 mg | Freq: Four times a day (QID) | INTRAMUSCULAR | Status: DC | PRN

## 2021-02-22 MED ORDER — ACETAMINOPHEN 500 MG PO TABS: 1000.0000 mg | ORAL_TABLET | Freq: Four times a day (QID) | ORAL | Status: DC

## 2021-02-22 MED ORDER — DEXAMETHASONE SODIUM PHOSPHATE 10 MG/ML IJ SOLN
INTRAMUSCULAR | Status: DC | PRN
  Administered 2021-02-22: 10 mg via INTRAVENOUS

## 2021-02-22 MED ORDER — LABETALOL HCL 5 MG/ML IV SOLN
INTRAVENOUS | Status: DC | PRN
  Administered 2021-02-22: 5 mg via INTRAVENOUS
  Administered 2021-02-22: 2.5 mg via INTRAVENOUS
  Administered 2021-02-22: 5 mg via INTRAVENOUS

## 2021-02-22 MED ORDER — SODIUM CHLORIDE 0.9 % IR SOLN
Status: DC | PRN
  Administered 2021-02-22 (×2): 3000 mL

## 2021-02-22 MED ORDER — BUPIVACAINE HCL (PF) 0.25 % IJ SOLN
INTRAMUSCULAR | Status: DC | PRN
  Administered 2021-02-22: 30 mL

## 2021-02-22 MED ORDER — BUPIVACAINE HCL (PF) 0.25 % IJ SOLN
INTRAMUSCULAR | Status: AC
  Filled 2021-02-22: qty 30

## 2021-02-22 MED ORDER — PHENYLEPHRINE HCL-NACL 20-0.9 MG/250ML-% IV SOLN
INTRAVENOUS | Status: AC
  Filled 2021-02-22: qty 250

## 2021-02-22 MED ORDER — METHOCARBAMOL 500 MG IVPB - SIMPLE MED: 500.0000 mg | Freq: Four times a day (QID) | INTRAVENOUS | Status: DC | PRN

## 2021-02-22 MED ORDER — MIDAZOLAM HCL 2 MG/2ML IJ SOLN
INTRAMUSCULAR | Status: AC
  Filled 2021-02-22: qty 2

## 2021-02-22 MED ORDER — ACETAMINOPHEN 325 MG PO TABS: 325.0000 mg | ORAL_TABLET | Freq: Four times a day (QID) | ORAL | Status: DC | PRN

## 2021-02-22 MED ORDER — FENTANYL CITRATE PF 50 MCG/ML IJ SOSY
PREFILLED_SYRINGE | INTRAMUSCULAR | Status: AC
  Administered 2021-02-22: 50 ug via INTRAVENOUS
  Filled 2021-02-22: qty 2

## 2021-02-22 MED ORDER — ROCURONIUM BROMIDE 100 MG/10ML IV SOLN
INTRAVENOUS | Status: DC | PRN
  Administered 2021-02-22: 20 mg via INTRAVENOUS
  Administered 2021-02-22: 50 mg via INTRAVENOUS
  Administered 2021-02-22: 10 mg via INTRAVENOUS

## 2021-02-22 MED ORDER — OXYCODONE HCL 5 MG PO TABS
ORAL_TABLET | ORAL | Status: AC
  Administered 2021-02-22: 10 mg via ORAL
  Filled 2021-02-22: qty 2

## 2021-02-22 MED ORDER — HYDROMORPHONE HCL 2 MG/ML IJ SOLN
INTRAMUSCULAR | Status: AC
  Filled 2021-02-22: qty 1

## 2021-02-22 SURGICAL SUPPLY — 59 items
BAG COUNTER SPONGE SURGICOUNT (BAG) ×1 IMPLANT
BAG DECANTER FOR FLEXI CONT (MISCELLANEOUS) ×1 IMPLANT
BOOTIES KNEE HIGH SLOAN (MISCELLANEOUS) ×4 IMPLANT
BURR OVAL 8 FLU 4.0X13 (MISCELLANEOUS) ×2 IMPLANT
CANNULA 5.75X7 CRYSTAL CLEAR (CANNULA) ×2 IMPLANT
CANNULA TWIST IN 8.25X7CM (CANNULA) IMPLANT
COOLER ICEMAN CLASSIC (MISCELLANEOUS) IMPLANT
COVER SURGICAL LIGHT HANDLE (MISCELLANEOUS) ×2 IMPLANT
CUTTER BONE 4.0MM X 13CM (MISCELLANEOUS) ×2 IMPLANT
DISSECTOR  3.8MM X 13CM (MISCELLANEOUS)
DISSECTOR 3.8MM X 13CM (MISCELLANEOUS) IMPLANT
DRAPE IMP U-DRAPE 54X76 (DRAPES) ×2 IMPLANT
DRAPE INCISE IOBAN 66X45 STRL (DRAPES) IMPLANT
DRAPE ORTHO SPLIT 77X108 STRL (DRAPES) ×2
DRAPE STERI 35X30 U-POUCH (DRAPES) ×2 IMPLANT
DRAPE SURG ORHT 6 SPLT 77X108 (DRAPES) ×2 IMPLANT
DRAPE U-SHAPE 47X51 STRL (DRAPES) ×4 IMPLANT
DRSG PAD ABDOMINAL 8X10 ST (GAUZE/BANDAGES/DRESSINGS) ×3 IMPLANT
DURAPREP 26ML APPLICATOR (WOUND CARE) ×2 IMPLANT
ELECT REM PT RETURN 15FT ADLT (MISCELLANEOUS) ×2 IMPLANT
GAUZE SPONGE 4X4 12PLY STRL (GAUZE/BANDAGES/DRESSINGS) ×2 IMPLANT
GAUZE XEROFORM 1X8 LF (GAUZE/BANDAGES/DRESSINGS) ×2 IMPLANT
GLOVE SRG 8 PF TXTR STRL LF DI (GLOVE) ×1 IMPLANT
GLOVE SURG ENC MOIS LTX SZ7.5 (GLOVE) ×2 IMPLANT
GLOVE SURG POLYISO LF SZ6.5 (GLOVE) ×2 IMPLANT
GLOVE SURG UNDER POLY LF SZ6.5 (GLOVE) ×2 IMPLANT
GLOVE SURG UNDER POLY LF SZ8 (GLOVE) ×1
GOWN STRL REUS W/TWL LRG LVL3 (GOWN DISPOSABLE) ×2 IMPLANT
GOWN STRL REUS W/TWL XL LVL3 (GOWN DISPOSABLE) ×2 IMPLANT
KIT BASIN OR (CUSTOM PROCEDURE TRAY) ×2 IMPLANT
KIT PUSHLOCK 2.9 HIP (KITS) IMPLANT
KIT TURNOVER KIT A (KITS) ×1 IMPLANT
LASSO 90 CVE QUICKPAS (DISPOSABLE) IMPLANT
LASSO CRESCENT QUICKPASS (SUTURE) IMPLANT
MANIFOLD NEPTUNE II (INSTRUMENTS) ×2 IMPLANT
NDL SCORPION MULTI FIRE (NEEDLE) IMPLANT
NEEDLE SCORPION MULTI FIRE (NEEDLE) IMPLANT
PACK ARTHROSCOPY WL (CUSTOM PROCEDURE TRAY) ×2 IMPLANT
PAD COLD SHLDR WRAP-ON (PAD) IMPLANT
PROBE BIPOLAR ATHRO 135MM 90D (MISCELLANEOUS) ×3 IMPLANT
PROTECTOR NERVE ULNAR (MISCELLANEOUS) ×2 IMPLANT
RESTRAINT HEAD UNIVERSAL NS (MISCELLANEOUS) IMPLANT
SLING ARM FOAM STRAP LRG (SOFTGOODS) ×1 IMPLANT
SLING ARM FOAM STRAP MED (SOFTGOODS) IMPLANT
SLING ARM IMMOBILIZER LRG (SOFTGOODS) IMPLANT
SLING ARM IMMOBILIZER MED (SOFTGOODS) IMPLANT
SUPPORT WRAP ARM LG (MISCELLANEOUS) ×2 IMPLANT
SUT ETHILON 3 0 PS 1 (SUTURE) ×2 IMPLANT
SUT PDS AB 1 CT1 27 (SUTURE) IMPLANT
SUT TIGER TAPE 7 IN WHITE (SUTURE) IMPLANT
SUTURE TAPE 1.3 40 TPR END (SUTURE) IMPLANT
SUTURETAPE 1.3 40 TPR END (SUTURE)
TAPE FIBER 2MM 7IN #2 BLUE (SUTURE) IMPLANT
TAPE LABRALWHITE 1.5X36 (TAPE) IMPLANT
TAPE SUT LABRALTAP WHT/BLK (SUTURE) IMPLANT
TOWEL OR 17X26 10 PK STRL BLUE (TOWEL DISPOSABLE) ×2 IMPLANT
TOWEL OR NON WOVEN STRL DISP B (DISPOSABLE) ×2 IMPLANT
TUBING ARTHROSCOPY IRRIG 16FT (MISCELLANEOUS) ×2 IMPLANT
TUBING CONNECTING 10 (TUBING) ×4 IMPLANT

## 2021-02-22 NOTE — Anesthesia Procedure Notes (Signed)
Procedure Name: Intubation Date/Time: 02/22/2021 2:02 PM Performed by: Garth Bigness, CRNA Pre-anesthesia Checklist: Patient identified, Emergency Drugs available, Suction available and Patient being monitored Patient Re-evaluated:Patient Re-evaluated prior to induction Oxygen Delivery Method: Circle system utilized Preoxygenation: Pre-oxygenation with 100% oxygen Induction Type: IV induction Ventilation: Mask ventilation without difficulty Tube type: Oral Tube size: 7.5 mm Number of attempts: 1 Airway Equipment and Method: Stylet and Oral airway Placement Confirmation: ETT inserted through vocal cords under direct vision, positive ETCO2 and breath sounds checked- equal and bilateral Secured at: 23 cm Tube secured with: Tape Dental Injury: Teeth and Oropharynx as per pre-operative assessment

## 2021-02-22 NOTE — Op Note (Signed)
Procedure(s): Procedure Note  Jason Wiggins. male 66 y.o. 02/22/2021   Preoperative diagnosis: Left shoulder partial rotator cuff tear  Left shoulder impingement with unfavorable acromial anatomy  Postoperative diagnosis: Same with proximal long head biceps tendon rupture and superior labral tear  Procedure performed: Left shoulder arthroscopic extensive debridement of partial rotator cuff tear, labral tear, biceps tear with subacromial decompression  Surgeon(s) and Role:    Tania Ade, MD - Primary     Surgeon: Rhae Hammock   Assistants: Laie was present and scrubbed throughout the procedure and was essential in positioning, assisting with the camera and instrumentation,, and closure)  Anesthesia: General endotracheal anesthesia with preoperative interscalene block given by the attending anesthesiologist   Procedure Detail   Estimated Blood Loss: Min         Drains: none  Blood Given: none         Specimens: none        Complications:  * No complications entered in OR log *         Disposition: PACU - hemodynamically stable.         Condition: stable    Procedure:   INDICATIONS FOR SURGERY: The patient is 66 y.o. male who was injured at work with left shoulder partial rotator cuff tear.  Failed conservative management.  Indicated for surgery to try and decrease pain and restore function.  OPERATIVE FINDINGS: Examination under anesthesia: No significant stiffness or instability.   DESCRIPTION OF PROCEDURE: The patient was identified in preoperative  holding area where I personally marked the operative site after  verifying site, side, and procedure with the patient. An interscalene block was given by the attending anesthesiologist the holding area.  The patient was taken back to the operating room where general anesthesia was induced without complication and was placed in the beach-chair position with the back  elevated  about 60 degrees and all extremities and head and neck carefully padded and  positioned.   The left upper extremity was then prepped and  draped in a standard sterile fashion. The appropriate time-out  procedure was carried out. The patient did receive IV antibiotics  within 30 minutes of incision.   A small posterior portal incision was made and the arthroscope was introduced into the joint. An anterior portal was then established above the subscapularis using needle localization. Small cannula was placed anteriorly. Diagnostic arthroscopy was then carried out .   He was noted to have a fair amount of synovitis in the joint.  This was debrided with the ArthroCare and shaver.  The superior labrum was noted to be torn with a bucket-handle tear and proximal long head biceps tendon tear with about a 1 cm stump remaining.  This was extensively debrided back to stable labrum removing the stump of the biceps and removing the bucket-handle tear.  The biceps tendon was absent.  Glenohumeral joint surfaces were intact without significant chondromalacia.  The undersurface of the supraspinatus had a partial tear involving about 50% of the internal fibers.  This was extensively debrided back to stable healthy tendon.  Formal repair was not felt to be necessary.  The arthroscope was then introduced into the subacromial space a standard lateral portal was established with needle localization. The shaver was used through the lateral portal to perform extensive bursectomy.  The bursal surface of the rotator cuff was noted to be intact without any perforation.  The coracoacromial ligament was taken down off the anterior acromion  with the ArthroCare exposing a moderate hooked anterior acromial spur. A high-speed bur was then used through the lateral portal to take down the anterior acromial spur from lateral to medial in a standard acromioplasty.  The acromioplasty was also viewed from the lateral portal and the bur  was used as necessary to ensure that the acromion was completely flat from posterior to anterior.  The arthroscopic equipment was removed from the joint and the portals were closed with 3-0 nylon in an interrupted fashion. Sterile dressings were then applied including Xeroform 4 x 4's ABDs and tape. The patient was then allowed to awaken from general anesthesia, placed in a sling, transferred to the stretcher and taken to the recovery room in stable condition.   POSTOPERATIVE PLAN: The patient will be discharged home today and will followup in one week for suture removal and wound check.

## 2021-02-22 NOTE — Discharge Instructions (Addendum)

## 2021-02-22 NOTE — Transfer of Care (Signed)
Immediate Anesthesia Transfer of Care Note  Patient: Jason Wiggins.  Procedure(s) Performed: SHOULDER ARTHROSCOPY WITH ROTATOR CUFF REPAIR VERSUS ROTATOR CUFF DEBRIDEMENT AND SUBACROMIAL DECOMPRESSION (Left)  Patient Location: PACU  Anesthesia Type:General  Level of Consciousness: alert   Airway & Oxygen Therapy: Patient Spontanous Breathing and Patient connected to face mask oxygen  Post-op Assessment: Report given to RN and Post -op Vital signs reviewed and stable  Post vital signs: Reviewed and stable  Last Vitals:  Vitals Value Taken Time  BP 149/96 02/22/21 1518  Temp    Pulse 77 02/22/21 1524  Resp 21 02/22/21 1524  SpO2 92 % 02/22/21 1524  Vitals shown include unvalidated device data.  Last Pain:  Vitals:   02/22/21 1101  PainSc: 10-Worst pain ever      Patients Stated Pain Goal: 3 (02/22/21 1101)  Complications: No notable events documented.

## 2021-02-22 NOTE — H&P (Signed)
Jason Chisolm. is an 66 y.o. male.   Chief Complaint: L shoulder pain after work injury HPI: s/p injury at work with L shoulder partial RCT, failed conservative treatment.  Past Medical History:  Diagnosis Date   Alcohol abuse    possibly as much as 24 beers per day,in past   Arthritis    COVID-19 virus infection 02/2019   History of echocardiogram    a. 10/2018 Echo: EF 55-60%, borderline LVH. Diast dysfxn. No significant valvular dzs.   HTN (hypertension)    Myocardial infarction (HCC)    Nonobstructive CAD (coronary artery disease)    a. 10/2016 Cath: LM nl, LAD 82m (FFR nl @ 0.83), LCX nl, RCA nl, EF nl; b. 10/2018 MV: no ischemia/infarct. EF 55-60%. Low risk.   Pneumonia     Past Surgical History:  Procedure Laterality Date   CARDIAC CATHETERIZATION     HERNIA REPAIR     Right inguinal   INTRAVASCULAR PRESSURE WIRE/FFR STUDY N/A 10/28/2016   Procedure: INTRAVASCULAR PRESSURE WIRE/FFR STUDY;  Surgeon: Marykay Lex, MD;  Location: [facility] INVASIVE CV LAB;  Service: Cardiovascular;  Laterality: N/A;   LEFT HEART CATH AND CORONARY ANGIOGRAPHY N/A 10/28/2016   Procedure: LEFT HEART CATH AND CORONARY ANGIOGRAPHY;  Surgeon: Marykay Lex, MD;  Location: [facility] INVASIVE CV LAB;  Service: Cardiovascular;  Laterality: N/A;    Family History  Problem Relation Age of Onset   Cancer Mother    Cancer Father    Social History:  reports that he has never smoked. He has never used smokeless tobacco. He reports current alcohol use. He reports that he does not use drugs.  Allergies: No Known Allergies  Facility-Administered Medications Prior to Admission  Medication Dose Route Frequency Provider Last Rate Last Admin   diclofenac Sodium (VOLTAREN) 1 % topical gel 2 g  2 g Topical QID PRN Zigmund Daniel., MD       Medications Prior to Admission  Medication Sig Dispense Refill   albuterol (VENTOLIN HFA) 108 (90 Base) MCG/ACT inhaler Inhale 1-2 puffs into the lungs every 6 (six)  hours as needed for wheezing or shortness of breath.     aspirin EC 81 MG tablet Take 81 mg by mouth daily. Swallow whole.     fluticasone (FLONASE) 50 MCG/ACT nasal spray Place 1-2 sprays into both nostrils daily as needed for allergies or rhinitis.     Ipratropium-Albuterol (COMBIVENT) 20-100 MCG/ACT AERS respimat Inhale 2 puffs into the lungs in the morning and at bedtime.      No results found for this or any previous visit (from the past 48 hour(s)). No results found.  Review of Systems  All other systems reviewed and are negative.  Blood pressure (!) 187/92, pulse 86, temperature (!) 97.5 F (36.4 C), resp. rate 18, height 5\' 9"  (1.753 m), weight 112.9 kg, SpO2 98 %. Physical Exam Constitutional:      Appearance: He is well-developed.  HENT:     Head: Atraumatic.  Eyes:     Extraocular Movements: Extraocular movements intact.  Cardiovascular:     Pulses: Normal pulses.  Pulmonary:     Effort: Pulmonary effort is normal.  Musculoskeletal:     Comments: L shoulder pain with RC testing  Skin:    General: Skin is warm and dry.  Neurological:     Mental Status: He is alert and oriented to person, place, and time.  Psychiatric:        Mood and Affect: Mood normal.  Assessment/Plan s/p injury at work with L shoulder partial RCT, failed conservative treatment. Plan L arth RCD vs RCR, SAD Risks / benefits of surgery discussed Consent on chart  NPO for OR Preop antibiotics   Glennon Hamilton, MD 02/22/2021, 1:22 PM

## 2021-02-22 NOTE — Anesthesia Postprocedure Evaluation (Signed)
Anesthesia Post Note  Patient: Jason Wiggins.  Procedure(s) Performed: SHOULDER ARTHROSCOPY WITH ROTATOR CUFF REPAIR VERSUS ROTATOR CUFF DEBRIDEMENT AND SUBACROMIAL DECOMPRESSION (Left)     Patient location during evaluation: PACU Anesthesia Type: Regional and General Level of consciousness: awake and alert Pain management: pain level controlled Vital Signs Assessment: post-procedure vital signs reviewed and stable Respiratory status: spontaneous breathing, nonlabored ventilation, respiratory function stable and patient connected to nasal cannula oxygen Cardiovascular status: blood pressure returned to baseline and stable Postop Assessment: no apparent nausea or vomiting Anesthetic complications: no   No notable events documented.  Last Vitals:  Vitals:   02/22/21 1600 02/22/21 1615  BP: 139/81   Pulse: 77 76  Resp: 17 17  Temp: 36.6 C   SpO2: 96% 94%    Last Pain:  Vitals:   02/22/21 1600  PainSc: 5                  Collene Schlichter

## 2021-02-23 ENCOUNTER — Encounter (HOSPITAL_COMMUNITY): Payer: Self-pay | Admitting: Orthopedic Surgery

## 2021-04-10 IMAGING — DX PORTABLE CHEST - 1 VIEW
1 series · 1 of 1 positions shown · non-contrast
Comparison: 03/21/2018 chest radiograph.

CLINICAL DATA: Dyspnea, worsening

EXAM:
PORTABLE CHEST 1 VIEW

[chest ap]
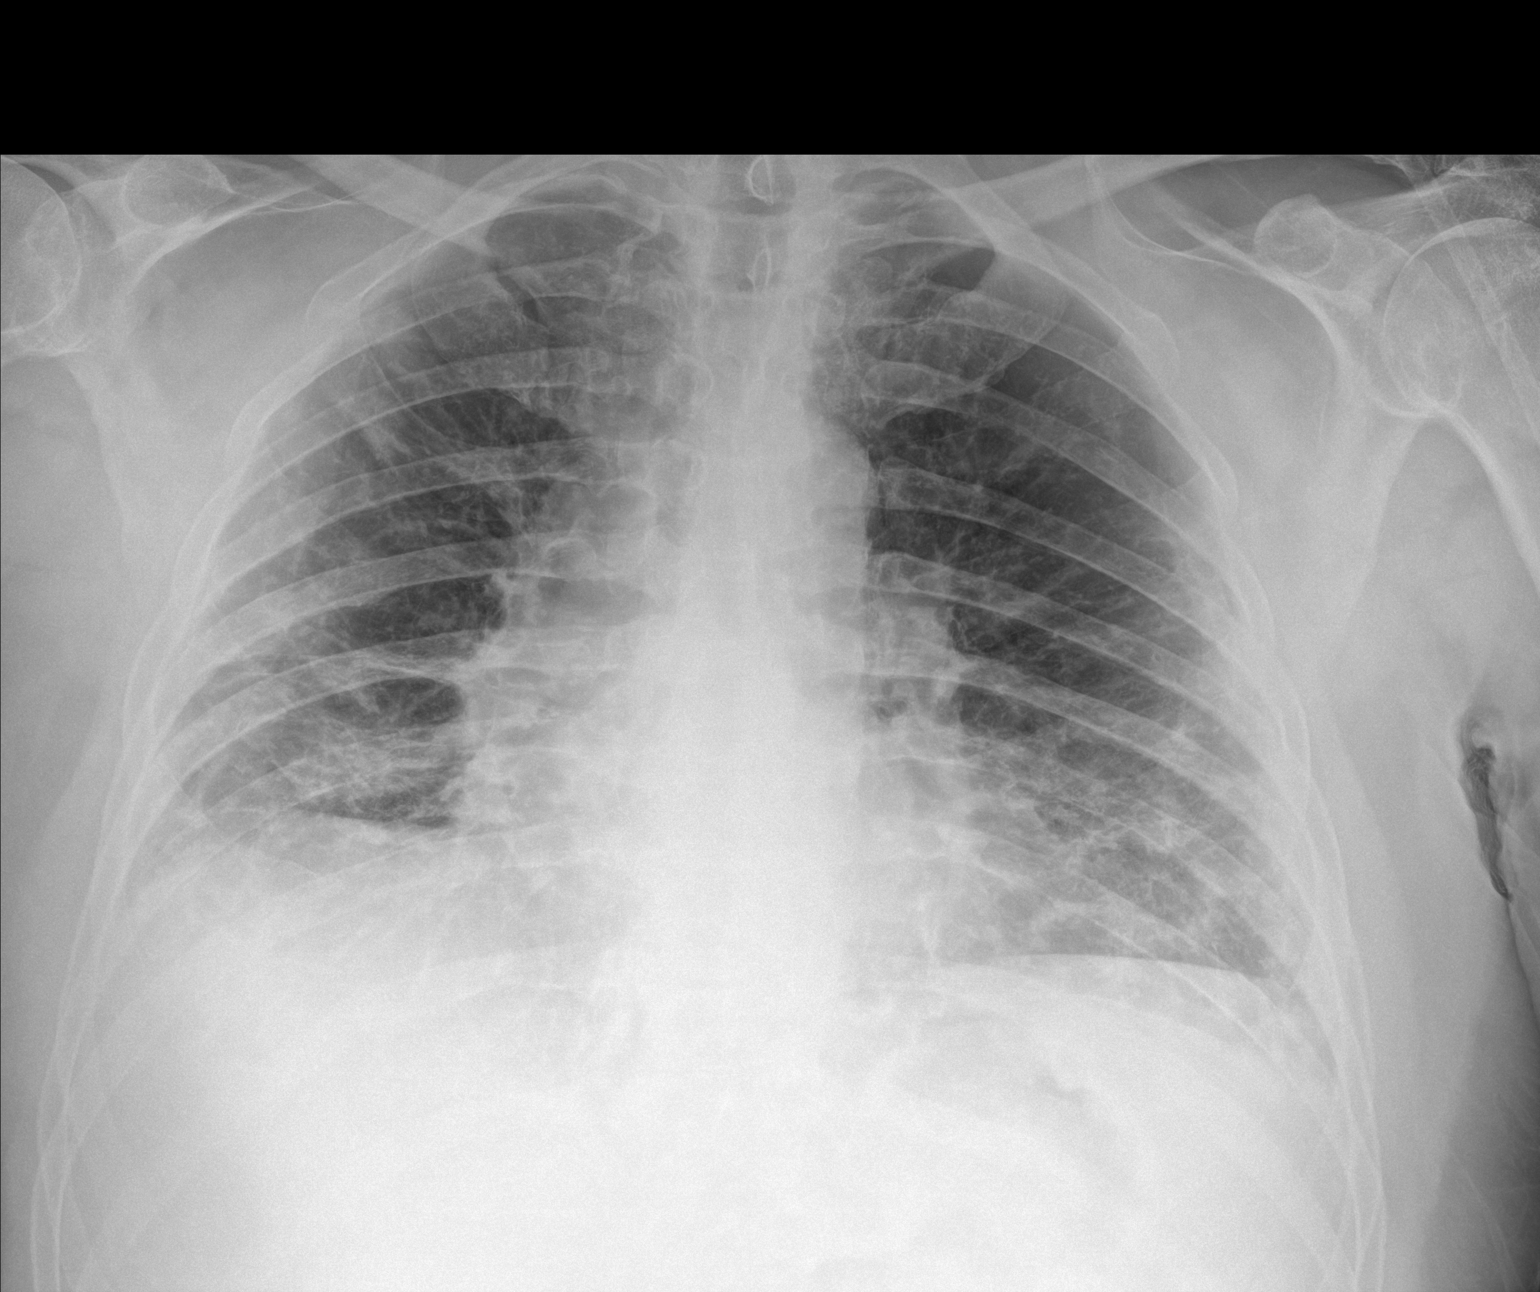

[1 of 1 positions shown; findings below may reference images not displayed]

FINDINGS: Stable cardiomediastinal silhouette with normal heart size. No
pneumothorax. No pleural effusion. There is patchy opacity
throughout both lungs with a peripheral basilar predominance,
slightly worsened from prior.
IMPRESSION: Patchy opacity throughout both lungs with a peripheral basilar
predominance, slightly worsened from prior. Atypical infection
including viral pneumonia is on the differential.

These results were called by telephone at the time of interpretation
acknowledged these results.

## 2021-04-20 ENCOUNTER — Ambulatory Visit: Payer: Medicaid Other | Admitting: Gerontology

## 2021-04-21 ENCOUNTER — Ambulatory Visit: Payer: Medicaid Other | Admitting: Gerontology

## 2021-04-21 ENCOUNTER — Encounter: Payer: Self-pay | Admitting: Gerontology

## 2021-04-21 VITALS — BP 137/83 | HR 97 | Temp 97.8°F | Resp 18 | Ht 69.0 in | Wt 249.4 lb

## 2021-04-21 DIAGNOSIS — R051 Acute cough: Secondary | ICD-10-CM

## 2021-04-21 DIAGNOSIS — R0981 Nasal congestion: Secondary | ICD-10-CM

## 2021-04-21 MED ORDER — SALINE SPRAY 0.65 % NA SOLN: 1.0000 | NASAL | 0 refills | Status: DC | PRN

## 2021-04-21 MED ORDER — BENZONATATE 100 MG PO CAPS: 100.0000 mg | ORAL_CAPSULE | Freq: Three times a day (TID) | ORAL | 0 refills | Status: DC | PRN

## 2021-04-21 MED ORDER — FLUTICASONE PROPIONATE 50 MCG/ACT NA SUSP: 2.0000 | Freq: Every day | NASAL | 0 refills | Status: DC

## 2021-04-21 NOTE — Progress Notes (Signed)
? ?Established Patient Office Visit ? ?Subjective:  ?Patient ID: Jason Wiggins., male    DOB: Feb 29, 1955  Age: 66 y.o. MRN: [id number] ? ?CC:  ?Chief Complaint  ?Patient presents with  ? Ear Pain  ?  Patient c/o ear pain and sore throat x 3 weeks. Home covid test negative yesterday.  ? Sore Throat  ? ? ?HPI ?Jason Wiggins is a 66 year old male who has history of alcohol abuse, COVID-19 virus infection, hypertension CAD, presents for c/o flu like symptoms. He states that he had a sick contact 3 weeks ago and his ears are clogged, otalgia, tinnitus, nasal congestion, sinus pressure, wheezing, sore throat, shortness of breath,  and productive cough with yellowish to clear phlegm. He had negative home Covid test yesterday. He  states that cough wakes him up at night. He chest pain, palpitation, headache, and offers no further complaint. ? ? ?Past Medical History:  ?Diagnosis Date  ? Alcohol abuse   ? possibly as much as 24 beers per day,in past  ? Arthritis   ? COVID-19 virus infection 02/2019  ? History of echocardiogram   ? a. 10/2018 Echo: EF 55-60%, borderline LVH. Diast dysfxn. No significant valvular dzs.  ? HTN (hypertension)   ? Myocardial infarction [facility])   ? Nonobstructive CAD (coronary artery disease)   ? a. 10/2016 Cath: LM nl, LAD 16m (FFR nl @ 0.83), LCX nl, RCA nl, EF nl; b. 10/2018 MV: no ischemia/infarct. EF 55-60%. Low risk.  ? Pneumonia   ? ? ?Past Surgical History:  ?Procedure Laterality Date  ? CARDIAC CATHETERIZATION    ? HERNIA REPAIR    ? Right inguinal  ? INTRAVASCULAR PRESSURE WIRE/FFR STUDY N/A 10/28/2016  ? Procedure: INTRAVASCULAR PRESSURE WIRE/FFR STUDY;  Surgeon: Marykay Lex, MD;  Location: [facility] INVASIVE CV LAB;  Service: Cardiovascular;  Laterality: N/A;  ? LEFT HEART CATH AND CORONARY ANGIOGRAPHY N/A 10/28/2016  ? Procedure: LEFT HEART CATH AND CORONARY ANGIOGRAPHY;  Surgeon: Marykay Lex, MD;  Location: [facility] INVASIVE CV LAB;  Service: Cardiovascular;  Laterality: N/A;  ?  SHOULDER ARTHROSCOPY WITH ROTATOR CUFF REPAIR AND SUBACROMIAL DECOMPRESSION Left 02/22/2021  ? Procedure: SHOULDER ARTHROSCOPY WITH ROTATOR CUFF REPAIR VERSUS ROTATOR CUFF DEBRIDEMENT AND SUBACROMIAL DECOMPRESSION;  Surgeon: Jones Broom, MD;  Location: [facility] ORS;  Service: Orthopedics;  Laterality: Left;  ? ? ?Family History  ?Problem Relation Age of Onset  ? Cancer Mother   ? Cancer Father   ? ? ?Social History  ? ?Socioeconomic History  ? Marital status: Legally Separated  ?  Spouse name: Not on file  ? Number of children: 2  ? Years of education: Not on file  ? Highest education level: Not on file  ?Occupational History  ? Not on file  ?Tobacco Use  ? Smoking status: Never  ?  Passive exposure: Current  ? Smokeless tobacco: Never  ?Vaping Use  ? Vaping Use: Never used  ?Substance and Sexual Activity  ? Alcohol use: Yes  ?  Comment: 6 pack a day 04/21/21 1-2 beers/day  ? Drug use: No  ? Sexual activity: Yes  ?  Birth control/protection: Condom  ?Other Topics Concern  ? Not on file  ?Social History Narrative  ? Lives with sister.  Lost his job.    ? ?Social Determinants of Health  ? ?Financial Resource Strain: Not on file  ?Food Insecurity: Not on file  ?Transportation Needs: Not on file  ?Physical Activity: Not on file  ?Stress: Not on file  ?  Social Connections: Not on file  ?Intimate Partner Violence: Not on file  ? ? ?Outpatient Medications Prior to Visit  ?Medication Sig Dispense Refill  ? albuterol (VENTOLIN HFA) 108 (90 Base) MCG/ACT inhaler Inhale 1-2 puffs into the lungs every 6 (six) hours as needed for wheezing or shortness of breath.    ? aspirin EC 81 MG tablet Take 81 mg by mouth daily. Swallow whole.    ? Ipratropium-Albuterol (COMBIVENT) 20-100 MCG/ACT AERS respimat Inhale 2 puffs into the lungs in the morning and at bedtime.    ? meloxicam (MOBIC) 15 MG tablet Take 15 mg by mouth daily. (Patient not taking: Reported on 04/21/2021)    ? methocarbamol (ROBAXIN) 500 MG tablet Take 1 tablet (500 mg  total) by mouth every 8 (eight) hours as needed for muscle spasms. 30 tablet 0  ? oxyCODONE-acetaminophen (PERCOCET) 5-325 MG tablet Take 1 tablet by mouth every 4 (four) hours as needed for severe pain. 30 tablet 0  ? ?Facility-Administered Medications Prior to Visit  ?Medication Dose Route Frequency Provider Last Rate Last Admin  ? diclofenac Sodium (VOLTAREN) 1 % topical gel 2 g  2 g Topical QID PRN Zigmund DanielPowell, A Caldwell Jr., MD      ? ? ?No Known Allergies ? ?ROS ?Review of Systems  ?Constitutional:  Positive for chills and fever. Negative for fatigue.  ?HENT:  Positive for congestion, ear pain, sore throat and tinnitus. Negative for rhinorrhea.   ?Respiratory:  Positive for cough, shortness of breath and wheezing.   ?Cardiovascular: Negative.   ?Neurological: Negative.   ? ?  ?Objective:  ?  ?Physical Exam ?HENT:  ?   Head: Normocephalic and atraumatic.  ?   Right Ear: Tympanic membrane and ear canal normal. No tenderness. No middle ear effusion. Tympanic membrane is not erythematous.  ?   Left Ear: Tympanic membrane and ear canal normal. No tenderness.  No middle ear effusion. Tympanic membrane is not erythematous.  ?   Nose: No congestion or rhinorrhea.  ?   Mouth/Throat:  ?   Mouth: Mucous membranes are moist. No oral lesions.  ?   Pharynx: Oropharynx is clear. Uvula midline. No oropharyngeal exudate, posterior oropharyngeal erythema or uvula swelling.  ?   Tonsils: No tonsillar exudate or tonsillar abscesses.  ?Eyes:  ?   Conjunctiva/sclera: Conjunctivae normal.  ?   Pupils: Pupils are equal, round, and reactive to light.  ?Cardiovascular:  ?   Rate and Rhythm: Normal rate and regular rhythm.  ?   Heart sounds: Normal heart sounds.  ?Pulmonary:  ?   Effort: Pulmonary effort is normal.  ?   Breath sounds: Normal breath sounds.  ?Abdominal:  ?   General: Bowel sounds are normal.  ?   Palpations: Abdomen is soft.  ?Musculoskeletal:  ?   Cervical back: Normal range of motion.  ?Skin: ?   General: Skin is warm.   ?Neurological:  ?   General: No focal deficit present.  ?   Mental Status: He is alert and oriented to person, place, and time.  ?Psychiatric:     ?   Mood and Affect: Mood normal.     ?   Behavior: Behavior normal.  ? ? ?BP 137/83 (BP Location: Right Arm, Patient Position: Sitting, Cuff Size: Large)   Pulse 97   Temp 97.8 ?F (36.6 ?C) (Oral)   Resp 18   Ht 5\' 9"  (1.753 m)   Wt 249 lb 6.4 oz (113.1 kg)   SpO2 95%  BMI 36.83 kg/m?  ?Wt Readings from Last 3 Encounters:  ?04/21/21 249 lb 6.4 oz (113.1 kg)  ?02/22/21 249 lb (112.9 kg)  ?02/18/21 249 lb (112.9 kg)  ? ?Encouraged weight loss ? ?Health Maintenance Due  ?Topic Date Due  ? Hepatitis C Screening  Never done  ? TETANUS/TDAP  Never done  ? Zoster Vaccines- Shingrix (1 of 2) Never done  ? COLONOSCOPY (Pts 45-65yrs Insurance coverage will need to be confirmed)  Never done  ? COVID-19 Vaccine (3 - Pfizer risk series) 06/19/2019  ? ? ?There are no preventive care reminders to display for this patient. ? ?Lab Results  ?Component Value Date  ? TSH 1.733 04/26/2018  ? ?Lab Results  ?Component Value Date  ? WBC 7.2 02/18/2021  ? HGB 14.7 02/18/2021  ? HCT 41.6 02/18/2021  ? MCV 96.5 02/18/2021  ? PLT 170 02/18/2021  ? ?Lab Results  ?Component Value Date  ? NA 140 02/18/2021  ? K 4.1 02/18/2021  ? CO2 24 02/18/2021  ? GLUCOSE 124 (H) 02/18/2021  ? BUN 19 02/18/2021  ? CREATININE 0.81 02/18/2021  ? BILITOT 0.4 09/19/2018  ? ALKPHOS 73 09/19/2018  ? AST 39 09/19/2018  ? ALT 55 (H) 09/19/2018  ? PROT 6.3 09/19/2018  ? ALBUMIN 4.3 09/19/2018  ? CALCIUM 9.2 02/18/2021  ? ANIONGAP 8 02/18/2021  ? ?Lab Results  ?Component Value Date  ? CHOL 195 02/13/2019  ? ?Lab Results  ?Component Value Date  ? HDL 43 02/13/2019  ? ?Lab Results  ?Component Value Date  ? LDLCALC 85 02/13/2019  ? ?Lab Results  ?Component Value Date  ? TRIG 412 (H) 02/13/2019  ? ?Lab Results  ?Component Value Date  ? CHOLHDL 4.5 02/13/2019  ? ?Lab Results  ?Component Value Date  ? HGBA1C 5.5 07/30/2020   ? ? ?  ?Assessment & Plan:  ? ?1. Acute cough ?- He was started on [employer], educated on medication side effects and advised to notify clinic for worsening symptoms. ?- benzonatate (TESSALON PERLES) 100 MG capsule

## 2021-05-27 ENCOUNTER — Ambulatory Visit: Payer: Medicaid Other | Admitting: Gerontology

## 2021-06-08 ENCOUNTER — Ambulatory Visit: Payer: Medicaid Other | Admitting: Gerontology

## 2021-06-30 ENCOUNTER — Other Ambulatory Visit: Payer: Self-pay

## 2021-06-30 ENCOUNTER — Encounter: Payer: Self-pay | Admitting: Gerontology

## 2021-06-30 ENCOUNTER — Ambulatory Visit: Payer: Medicaid Other | Admitting: Gerontology

## 2021-06-30 VITALS — BP 156/82 | HR 82 | Temp 97.6°F | Resp 17 | Ht 69.0 in | Wt 252.1 lb

## 2021-06-30 DIAGNOSIS — I10 Essential (primary) hypertension: Secondary | ICD-10-CM

## 2021-06-30 MED ORDER — AMLODIPINE BESYLATE 5 MG PO TABS
5.0000 mg | ORAL_TABLET | Freq: Every day | ORAL | 0 refills | Status: DC
  Filled 2021-06-30: qty 30, 30d supply, fill #0

## 2021-06-30 NOTE — Progress Notes (Signed)
Established Patient Office Visit  Subjective   Patient ID: Jason Wiggins., male    DOB: [birth year]  Age: 66 y.o. MRN: [id number]  Chief Complaint  Patient presents with   Follow-up   Hypertension    Patient states his BP has been elevated 198/102, 190/98 at Physical Therapy. PT will not see him again until he gets BP under control.    HPI  Jason Wiggins is a 66 year old male who has history of alcohol abuse, COVID-19 virus infection, hypertension CAD. He states that his blood pressure has being elevated, and it was 156/82 during visit. He checks it at home and it was 198/102 . He denies chest pain, shortness of breath, light headedness and vision changes.  Overall, he states that he is doing well and offers no further complaint.  Review of Systems  Constitutional: Negative.   Eyes: Negative.   Respiratory: Negative.    Cardiovascular: Negative.   Neurological: Negative.       Objective:     BP (!) 156/82 (BP Location: Right Arm, Patient Position: Sitting, Cuff Size: Large)   Pulse 82   Temp 97.6 F (36.4 C) (Oral)   Resp 17   Ht 5\' 9"  (1.753 m)   Wt 252 lb 1.6 oz (114.4 kg)   SpO2 94%   BMI 37.23 kg/m  BP Readings from Last 3 Encounters:  06/30/21 (!) 156/82  04/21/21 137/83  02/22/21 (!) 147/95   Wt Readings from Last 3 Encounters:  06/30/21 252 lb 1.6 oz (114.4 kg)  04/21/21 249 lb 6.4 oz (113.1 kg)  02/22/21 249 lb (112.9 kg)     Encouraged weight loss  Physical Exam HENT:     Head: Normocephalic and atraumatic.     Mouth/Throat:     Mouth: Mucous membranes are moist.  Eyes:     Extraocular Movements: Extraocular movements intact.     Conjunctiva/sclera: Conjunctivae normal.     Pupils: Pupils are equal, round, and reactive to light.  Cardiovascular:     Rate and Rhythm: Normal rate and regular rhythm.     Pulses: Normal pulses.     Heart sounds: Normal heart sounds.  Pulmonary:     Effort: Pulmonary effort is normal.     Breath sounds: Normal  breath sounds.  Neurological:     General: No focal deficit present.     Mental Status: He is alert and oriented to person, place, and time. Mental status is at baseline.  Psychiatric:        Mood and Affect: Mood normal.        Behavior: Behavior normal.        Thought Content: Thought content normal.        Judgment: Judgment normal.      No results found for any visits on 06/30/21.  Last CBC Lab Results  Component Value Date   WBC 7.2 02/18/2021   HGB 14.7 02/18/2021   HCT 41.6 02/18/2021   MCV 96.5 02/18/2021   MCH 34.1 (H) 02/18/2021   RDW 12.1 02/18/2021   PLT 170 02/18/2021   Last metabolic panel Lab Results  Component Value Date   GLUCOSE 124 (H) 02/18/2021   NA 140 02/18/2021   K 4.1 02/18/2021   CL 108 02/18/2021   CO2 24 02/18/2021   BUN 19 02/18/2021   CREATININE 0.81 02/18/2021   GFRNONAA >60 02/18/2021   CALCIUM 9.2 02/18/2021   PHOS 1.9 (L) 04/22/2018   PROT 6.3 09/19/2018   ALBUMIN  4.3 09/19/2018   LABGLOB 2.0 09/19/2018   AGRATIO 2.2 09/19/2018   BILITOT 0.4 09/19/2018   ALKPHOS 73 09/19/2018   AST 39 09/19/2018   ALT 55 (H) 09/19/2018   ANIONGAP 8 02/18/2021   Last lipids Lab Results  Component Value Date   CHOL 195 02/13/2019   HDL 43 02/13/2019   LDLCALC 85 02/13/2019   TRIG 412 (H) 02/13/2019   CHOLHDL 4.5 02/13/2019   Last hemoglobin A1c Lab Results  Component Value Date   HGBA1C 5.5 07/30/2020   Last thyroid functions Lab Results  Component Value Date   TSH 1.733 04/26/2018      The 10-year ASCVD risk score (Arnett DK, et al., 2019) is: 21.7%    Assessment & Plan:   1. Essential hypertension -His blood pressure is not under control, his goal should be less than 140/90.  He was started on 5 mg amlodipine, educated on medication side effects and advised to notify clinic.  He was encouraged to continue on DASH diet and exercise as tolerated.  He was also advised to check his blood pressure daily, record and bring log to  follow-up appointment. - amLODipine (NORVASC) 5 MG tablet; Take 1 tablet (5 mg total) by mouth daily.  Dispense: 30 tablet; Refill: 0   Return in about 4 weeks (around 07/28/2021), or if symptoms worsen or fail to improve.    Rhylee Nunn Trellis Paganini, NP

## 2021-06-30 NOTE — Patient Instructions (Signed)

## 2021-07-28 ENCOUNTER — Other Ambulatory Visit: Payer: Self-pay

## 2021-07-28 ENCOUNTER — Encounter: Payer: Self-pay | Admitting: Gerontology

## 2021-07-28 ENCOUNTER — Ambulatory Visit: Payer: Medicaid Other | Admitting: Gerontology

## 2021-07-28 VITALS — BP 147/91 | HR 91 | Temp 97.7°F | Resp 18 | Ht 69.0 in | Wt 252.6 lb

## 2021-07-28 DIAGNOSIS — R0981 Nasal congestion: Secondary | ICD-10-CM

## 2021-07-28 DIAGNOSIS — I10 Essential (primary) hypertension: Secondary | ICD-10-CM

## 2021-07-28 MED ORDER — FLUTICASONE PROPIONATE 50 MCG/ACT NA SUSP
2.0000 | Freq: Every day | NASAL | 0 refills | Status: DC
  Filled 2021-07-28: qty 16, 30d supply, fill #0

## 2021-07-28 MED ORDER — AMLODIPINE BESYLATE 5 MG PO TABS
5.0000 mg | ORAL_TABLET | Freq: Every day | ORAL | 1 refills | Status: DC
  Filled 2021-07-28: qty 30, 30d supply, fill #0

## 2021-07-28 NOTE — Progress Notes (Signed)
Established Patient Office Visit  Subjective   Patient ID: Jason Dini., male    DOB: 11-Dec-1955  Age: 66 y.o. MRN: [id number]  Chief Complaint  Patient presents with   Follow-up   Hypertension    Patient brought BP log to his visit today.    HPI  Jason Wiggins is a 66 year old male who has history of alcohol abuse, COVID-19 virus infection, hypertension ,CAD presents for follow up of hypertension. At his last visit, he was started on 5 mg Amlodipine and brought his blood pressure log. His SBP ranges between 158-196 and DBP 88-110. He denies  chest pain, palpitation, light headedness and vision changes. His blood pressure was 147/91 when checked during visit. He states that he's compliant with his medications, denies side effects and continues to make healthy lifestyle changes. He c/o nasal congestion that has been going on for 4 weeks, denies rhinorrhea, sinus pressure and cough. Overall, he states that he's doing well and offers no further complaint.   Review of Systems  Constitutional: Negative.   Eyes: Negative.   Respiratory: Negative.    Cardiovascular: Negative.   Neurological: Negative.       Objective:     BP (!) 147/91 (BP Location: Right Arm, Patient Position: Sitting, Cuff Size: Large)   Pulse 91   Temp 97.7 F (36.5 C) (Oral)   Resp 18   Ht 5\' 9"  (1.753 m)   Wt 252 lb 9.6 oz (114.6 kg)   SpO2 95%   BMI 37.30 kg/m  BP Readings from Last 3 Encounters:  07/28/21 (!) 147/91  06/30/21 (!) 156/82  04/21/21 137/83   Wt Readings from Last 3 Encounters:  07/28/21 252 lb 9.6 oz (114.6 kg)  06/30/21 252 lb 1.6 oz (114.4 kg)  04/21/21 249 lb 6.4 oz (113.1 kg)      Physical Exam HENT:     Head: Normocephalic and atraumatic.     Mouth/Throat:     Mouth: Mucous membranes are moist.  Eyes:     Extraocular Movements: Extraocular movements intact.     Conjunctiva/sclera: Conjunctivae normal.     Pupils: Pupils are equal, round, and reactive to light.   Cardiovascular:     Rate and Rhythm: Normal rate and regular rhythm.     Pulses: Normal pulses.     Heart sounds: Normal heart sounds.  Pulmonary:     Effort: Pulmonary effort is normal.     Breath sounds: Normal breath sounds.  Neurological:     General: No focal deficit present.     Mental Status: He is alert and oriented to person, place, and time. Mental status is at baseline.  Psychiatric:        Mood and Affect: Mood normal.        Behavior: Behavior normal.        Thought Content: Thought content normal.        Judgment: Judgment normal.      No results found for any visits on 07/28/21.  Last CBC Lab Results  Component Value Date   WBC 7.2 02/18/2021   HGB 14.7 02/18/2021   HCT 41.6 02/18/2021   MCV 96.5 02/18/2021   MCH 34.1 (H) 02/18/2021   RDW 12.1 02/18/2021   PLT 170 02/18/2021   Last metabolic panel Lab Results  Component Value Date   GLUCOSE 124 (H) 02/18/2021   NA 140 02/18/2021   K 4.1 02/18/2021   CL 108 02/18/2021   CO2 24 02/18/2021  BUN 19 02/18/2021   CREATININE 0.81 02/18/2021   GFRNONAA >60 02/18/2021   CALCIUM 9.2 02/18/2021   PHOS 1.9 (L) 04/22/2018   PROT 6.3 09/19/2018   ALBUMIN 4.3 09/19/2018   LABGLOB 2.0 09/19/2018   AGRATIO 2.2 09/19/2018   BILITOT 0.4 09/19/2018   ALKPHOS 73 09/19/2018   AST 39 09/19/2018   ALT 55 (H) 09/19/2018   ANIONGAP 8 02/18/2021   Last lipids Lab Results  Component Value Date   CHOL 195 02/13/2019   HDL 43 02/13/2019   LDLCALC 85 02/13/2019   TRIG 412 (H) 02/13/2019   CHOLHDL 4.5 02/13/2019   Last hemoglobin A1c Lab Results  Component Value Date   HGBA1C 5.5 07/30/2020      The 10-year ASCVD risk score (Arnett DK, et al., 2019) is: 19.8%    Assessment & Plan:   1. Essential hypertension - His blood pressure is not controlled, his goal should be less than 140/90. He will continue current medication, check blood pressure daily record and bring log to appointment. He was encouraged to  continue on DASH diet and exercise as tolerated. He was educated on signs and symptoms of stroke and advised to go to the ED. - amLODipine (NORVASC) 5 MG tablet; Take 1 tablet (5 mg total) by mouth daily.  Dispense: 30 tablet; Refill: 1  2. Nasal congestion - He was started on Flonase, educated on medication side effects ad advised to notify clinic.  - fluticasone (FLONASE) 50 MCG/ACT nasal spray; Place 2 sprays into both nostrils daily.  Dispense: 16 g; Refill: 0   Return in about 29 days (around 08/26/2021), or if symptoms worsen or fail to improve.    Namiyah Grantham Trellis Paganini, NP

## 2021-07-28 NOTE — Patient Instructions (Signed)

## 2021-08-09 NOTE — Progress Notes (Signed)
Cardiology Office Note    Date:  08/12/2021   ID:  Jason Cove., DOB 1955/02/27, MRN [id number]  PCP:  Rolm Gala, NP  Cardiologist:  Debbe Odea, MD  Electrophysiologist:  None   Chief Complaint: Follow-up  History of Present Illness:   Jason Mckee. is a 66 y.o. 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  LHC in 2018 showed moderate one-vessel CAD with 60% mid LAD stenosis that was not significant by FFR.  Most recent ischemic evaluation, via nuclear stress test, in 10/2018 showed no evidence of ischemia and was low risk with an EF of 55 to 65%.  Echo in 10/2018 demonstrated an EF of 55 to 60%, borderline LVH, diastolic dysfunction, low normal RV systolic function with normal ventricular cavity size, and no significant valvular abnormalities noted.  Most recent echo from 01/2020 demonstrated an EF of 55 to 60%, no regional wall motion abnormalities, mild LVH, normal LV diastolic function parameters, normal RV systolic function and ventricular cavity size, normal PASP, mildly dilated left atrium, and no significant valvular abnormalities.  He was most recently seen in the office in 02/2021 for preoperative cardiac risk stratification prior to rotator cuff repair of the left shoulder.  He was noted to have had chronic shortness of breath since retirement, in the context of post inflammatory pulmonary fibrosis following atypical, possibly COVID, pneumonia.  He comes then doing reasonably well from a cardiac perspective.  He is without symptoms of angina or decompensation.  He continues to note chronic shortness of breath that is unchanged and dates back to his COVID illness.  He continues to have left shoulder discomfort despite undergoing surgical repair.  This discomfort is worsened with movement/range  of motion.  He continues to be quite fatigued, though is unable to afford a CPAP.  He reports home blood pressure readings in the 180s to low 200s, though has been using a wrist sphygmomanometer.  Upon chart biopsy, his BP typically runs 120s to 150s systolic.  Currently taking aspirin 81 mg daily and amlodipine along with his Flonase and Combivent.   Labs independently reviewed: 02/2021 - potassium 4.1, BUN 19, serum creatinine 0.81, Hgb 14.7, PLT 170 07/2020 - A1c 5.5 02/2019 - TC 195, TG 412, HDL 43, LDL 85 (triglyceride greater than 400) 09/2018 - albumin 4.3, AST normal, ALT 55 04/2018 - TSH normal  Past Medical History:  Diagnosis Date   Alcohol abuse    possibly as much as 24 beers per day,in past   Arthritis    COVID-19 virus infection 02/2019   History of echocardiogram    a. 10/2018 Echo: EF 55-60%, borderline LVH. Diast dysfxn. No significant valvular dzs.   HTN (hypertension)    Myocardial infarction (HCC)    Nonobstructive CAD (coronary artery disease)    a. 10/2016 Cath: LM nl, LAD 36m (FFR nl @ 0.83), LCX nl, RCA nl, EF nl; b. 10/2018 MV: no ischemia/infarct. EF 55-60%. Low risk.   Pneumonia     Past Surgical History:  Procedure Laterality Date   CARDIAC CATHETERIZATION     HERNIA REPAIR     Right inguinal   INTRAVASCULAR PRESSURE WIRE/FFR STUDY N/A 10/28/2016   Procedure: INTRAVASCULAR PRESSURE WIRE/FFR STUDY;  Surgeon: Marykay Lex, MD;  Location: [facility] INVASIVE CV LAB;  Service: Cardiovascular;  Laterality: N/A;   LEFT  HEART CATH AND CORONARY ANGIOGRAPHY N/A 10/28/2016   Procedure: LEFT HEART CATH AND CORONARY ANGIOGRAPHY;  Surgeon: Marykay Lex, MD;  Location: [facility] INVASIVE CV LAB;  Service: Cardiovascular;  Laterality: N/A;   SHOULDER ARTHROSCOPY WITH ROTATOR CUFF REPAIR AND SUBACROMIAL DECOMPRESSION Left 02/22/2021   Procedure: SHOULDER ARTHROSCOPY WITH ROTATOR CUFF REPAIR VERSUS ROTATOR CUFF DEBRIDEMENT AND SUBACROMIAL DECOMPRESSION;  Surgeon: Jones Broom,  MD;  Location: [facility] ORS;  Service: Orthopedics;  Laterality: Left;    Current Medications: Current Meds  Medication Sig   albuterol (VENTOLIN HFA) 108 (90 Base) MCG/ACT inhaler Inhale 1-2 puffs into the lungs every 6 (six) hours as needed for wheezing or shortness of breath.   amLODipine (NORVASC) 10 MG tablet Take 1 tablet (10 mg total) by mouth daily.   aspirin EC 81 MG tablet Take 81 mg by mouth daily. Swallow whole.   fluticasone (FLONASE) 50 MCG/ACT nasal spray Place 2 sprays into both nostrils daily.   Ipratropium-Albuterol (COMBIVENT) 20-100 MCG/ACT AERS respimat Inhale 2 puffs into the lungs in the morning and at bedtime.   [DISCONTINUED] amLODipine (NORVASC) 5 MG tablet Take 1 tablet (5 mg total) by mouth daily.    Allergies:   Patient has no known allergies.   Social History   Socioeconomic History   Marital status: Legally Separated    Spouse name: Not on file   Number of children: 2   Years of education: Not on file   Highest education level: Not on file  Occupational History   Not on file  Tobacco Use   Smoking status: Never    Passive exposure: Current   Smokeless tobacco: Never  Vaping Use   Vaping Use: Never used  Substance and Sexual Activity   Alcohol use: Yes    Alcohol/week: 14.0 standard drinks of alcohol    Types: 14 Cans of beer per week    Comment: 6 pack a day 04/21/21, currently 2-3 beers/day   Drug use: No   Sexual activity: Yes    Birth control/protection: Condom  Other Topics Concern   Not on file  Social History Narrative   Lives with sister.  Lost his job.     Social Determinants of Health   Financial Resource Strain: High Risk (06/13/2019)   Overall Financial Resource Strain (CARDIA)    Difficulty of Paying Living Expenses: Very hard  Food Insecurity: No Food Insecurity (06/30/2021)   Hunger Vital Sign    Worried About Running Out of Food in the Last Year: Never true    Ran Out of Food in the Last Year: Never true  Transportation Needs:  No Transportation Needs (06/30/2021)   PRAPARE - Administrator, Civil Service (Medical): No    Lack of Transportation (Non-Medical): No  Physical Activity: Unknown (09/12/2019)   Exercise Vital Sign    Days of Exercise per Week: 0 days    Minutes of Exercise per Session: Not on file  Stress: Stress Concern Present (04/11/2019)   [employer] of Occupational Health - Occupational Stress Questionnaire    Feeling of Stress : Very much  Social Connections: Unknown (09/12/2019)   Social Connection and Isolation Panel [NHANES]    Frequency of Communication with Friends and Family: Not on file    Frequency of Social Gatherings with Friends and Family: Once a week    Attends Religious Services: Never    Database administrator or Organizations: Not on file    Attends Banker Meetings: Not on file  Marital Status: Separated     Family History:  The patient's family history includes Cancer in his father, maternal grandfather, maternal grandmother, mother, and sister; Other in his paternal grandfather and paternal grandmother.  ROS:   12-point review of systems is negative unless otherwise noted in the HPI.   EKGs/Labs/Other Studies Reviewed:    Studies reviewed were summarized above. The additional studies were reviewed today:  2D echo 02/03/2020: 1. Left ventricular ejection fraction, by estimation, is 55 to 60%. The  left ventricle has normal function. The left ventricle has no regional  wall motion abnormalities. There is mild left ventricular hypertrophy.  Left ventricular diastolic parameters  were normal. The average left ventricular global longitudinal strain is  -14.2 %. The global longitudinal strain is abnormal.   2. Right ventricular systolic function is normal. The right ventricular  size is normal. There is normal pulmonary artery systolic pressure.   3. Left atrial size was mildly dilated.   4. The mitral valve is normal in structure. No evidence  of mitral valve  regurgitation. No evidence of mitral stenosis.   5. The aortic valve is normal in structure. Aortic valve regurgitation is  not visualized. No aortic stenosis is present.   Comparison(s): Prior Echo showed LV EF 55-60%, no RWMA, impaired LV  relaxation, global low normal RV systolic function. __________  2D echo 10/30/2018: 1. Left ventricular ejection fraction, by visual estimation, is 55 to  60%. The left ventricle has normal function. Normal left ventricular size.  There is borderline left ventricular hypertrophy.   2. Left ventricular diastolic Doppler parameters are consistent with  impaired relaxation pattern of LV diastolic filling.   3. Global right ventricle has low normal systolic function.The right  ventricular size is normal. No increase in right ventricular wall  thickness.   4. Left atrial size was normal.   5. Right atrial size was normal.   6. The mitral valve was not well visualized. No evidence of mitral valve  regurgitation.   7. The tricuspid valve is not well visualized. Tricuspid valve  regurgitation was not visualized by color flow Doppler.   8. The aortic valve is tricuspid Aortic valve regurgitation was not  visualized by color flow Doppler. There is no aortic valve stenosis.   9. The pulmonic valve was not well visualized. Pulmonic valve  regurgitation is not visualized by color flow Doppler.  10. TR signal is inadequate for assessing pulmonary artery systolic  pressure.  11. The interatrial septum was not well visualized. __________  Eugenie Birks MPI 10/22/2018: T wave inversion was noted during stress in the aVL leads. There was no ST segment deviation noted during stress. The study is normal. This is a low risk study. The left ventricular ejection fraction is normal (55-65%). __________  LHC 10/28/2016:Mid LAD lesion, 60 %stenosed. moderate lesion, not physiologically significant based on FFR of 0.83 The left ventricular systolic  function is normal. LV end diastolic pressure is normal.   Essentially normal coronary arteries with the exception of one single vessel disease that is moderate in nature. Angiographically and physiologically moderate based on FFR.   Cannot exclude hypertension related supply versus demand ischemia with moderate lesion.   Plan:  The patient will be transferred back to short stay holding area and discharge after bedrest/TR band removal. I will start 2.5 mg amlodipine for discharge.    EKG:  EKG is ordered today.  The EKG ordered today demonstrates NSR, 85 bpm, left anterior fascicular block, LVH, poor R wave  progression along the precordial leads, possible prior anterior infarct versus lead placement, no acute ST-T changes  Recent Labs: 02/18/2021: BUN 19; Creatinine, Ser 0.81; Hemoglobin 14.7; Platelets 170; Potassium 4.1; Sodium 140  Recent Lipid Panel    Component Value Date/Time   CHOL 195 02/13/2019 1147   TRIG 412 (H) 02/13/2019 1147   HDL 43 02/13/2019 1147   CHOLHDL 4.5 02/13/2019 1147   CHOLHDL 3.9 04/23/2018 0448   VLDL 20 04/23/2018 0448   LDLCALC 85 02/13/2019 1147    PHYSICAL EXAM:    VS:  BP (!) 128/90 (BP Location: Left Arm, Patient Position: Sitting, Cuff Size: Normal)   Pulse 85   Ht 5\' 9"  (1.753 m)   Wt 255 lb 2 oz (115.7 kg)   SpO2 98%   BMI 37.68 kg/m   BMI: Body mass index is 37.68 kg/m.  Physical Exam Vitals reviewed.  Constitutional:      Appearance: He is well-developed.  HENT:     Head: Normocephalic and atraumatic.  Eyes:     General:        Right eye: No discharge.        Left eye: No discharge.  Neck:     Vascular: No JVD.  Cardiovascular:     Rate and Rhythm: Normal rate and regular rhythm.     Pulses:          Posterior tibial pulses are 2+ on the right side and 2+ on the left side.     Heart sounds: Normal heart sounds, S1 normal and S2 normal. Heart sounds not distant. No midsystolic click and no opening snap. No murmur heard.     No friction rub.  Pulmonary:     Effort: Pulmonary effort is normal. No respiratory distress.     Breath sounds: Normal breath sounds. No decreased breath sounds, wheezing or rales.  Chest:     Chest wall: No tenderness.  Abdominal:     General: There is no distension.     Palpations: Abdomen is soft.     Tenderness: There is no abdominal tenderness.  Musculoskeletal:     Cervical back: Normal range of motion.     Right lower leg: No edema.     Left lower leg: No edema.  Skin:    General: Skin is warm and dry.     Nails: There is no clubbing.  Neurological:     Mental Status: He is alert and oriented to person, place, and time.  Psychiatric:        Speech: Speech normal.        Behavior: Behavior normal.        Thought Content: Thought content normal.        Judgment: Judgment normal.     Wt Readings from Last 3 Encounters:  08/12/21 255 lb 2 oz (115.7 kg)  07/28/21 252 lb 9.6 oz (114.6 kg)  06/30/21 252 lb 1.6 oz (114.4 kg)     ASSESSMENT & PLAN:   CAD involving the native coronary arteries without angina: He is doing well without symptoms concerning for angina.  Recommend aggressive risk factor modification including aspirin and amlodipine with likely addition of statin once we have updated labs.  His shoulder discomfort is not consistent with angina as it is in the setting of a known injury and worsened with range of motion.  No exertional symptoms.  No indication for ischemic testing at this time.  HTN: Blood pressure at home is likely inaccurate due to wrist  blood pressure cuff.  Upon chart biopsy, blood pressure has typically ran in the 120s to 150s systolic.  Titrate amlodipine to 10 mg daily.  Low-sodium diet recommended.  HLD: Last triglyceride greater than 400.  Not currently on a statin.  We have placed orders for a CMP and lipid panel to be drawn through his PCPs office.  Recommend initiating lipid-lowering therapy as indicated to achieve target LDL less than  70.  Post inflammatory pulmonary fibrosis: Contributing to his dyspnea.  Follow-up with pulmonology as directed.  OSA: Currently without insurance and cannot afford a CPAP.  Likely contributing significantly to his ongoing fatigue.  Recommend referral to sleep medicine for initiation of therapy once he gets paperwork for Medicare.   Disposition: F/u with Dr. Azucena Cecil or an APP in 6 months.   Medication Adjustments/Labs and Tests Ordered: Current medicines are reviewed at length with the patient today.  Concerns regarding medicines are outlined above. Medication changes, Labs and Tests ordered today are summarized above and listed in the Patient Instructions accessible in Encounters.   Signed, Eula Listen, PA-C 08/12/2021 8:34 AM     [facility] [address] [place], [place] [address] [phone]

## 2021-08-12 ENCOUNTER — Other Ambulatory Visit: Payer: Self-pay

## 2021-08-12 ENCOUNTER — Ambulatory Visit (INDEPENDENT_AMBULATORY_CARE_PROVIDER_SITE_OTHER): Payer: Self-pay | Admitting: Physician Assistant

## 2021-08-12 ENCOUNTER — Encounter: Payer: Self-pay | Admitting: Physician Assistant

## 2021-08-12 VITALS — BP 128/90 | HR 85 | Ht 69.0 in | Wt 255.1 lb

## 2021-08-12 DIAGNOSIS — E785 Hyperlipidemia, unspecified: Secondary | ICD-10-CM

## 2021-08-12 DIAGNOSIS — I10 Essential (primary) hypertension: Secondary | ICD-10-CM

## 2021-08-12 DIAGNOSIS — J8410 Pulmonary fibrosis, unspecified: Secondary | ICD-10-CM

## 2021-08-12 DIAGNOSIS — G4733 Obstructive sleep apnea (adult) (pediatric): Secondary | ICD-10-CM

## 2021-08-12 DIAGNOSIS — I2510 Atherosclerotic heart disease of native coronary artery without angina pectoris: Secondary | ICD-10-CM

## 2021-08-12 MED ORDER — AMLODIPINE BESYLATE 10 MG PO TABS
10.0000 mg | ORAL_TABLET | Freq: Every day | ORAL | 3 refills | Status: AC
  Filled 2021-08-12: qty 90, 90d supply, fill #0
  Filled 2021-11-18: qty 90, 90d supply, fill #1

## 2021-08-12 NOTE — Patient Instructions (Addendum)
Please check blood pressures on the upper arms and continue to monitor and keep a log. Here are some brief tips on monitoring.  Please monitor blood pressures and keep a log of your readings.   Make sure to check 2 hours after your medications.   AVOID these things for 30 minutes before checking your blood pressure: No Drinking caffeine. No Drinking alcohol. No Eating. No Smoking. No Exercising.  Five minutes before checking your blood pressure: Pee. Sit in a dining chair. Avoid sitting in a soft couch or armchair. Be quiet. Do not talk.   Medication Instructions:  Your physician has recommended you make the following change in your medication:   INCREASE Amlodipine to 10 mg once daily   *If you need a refill on your cardiac medications before your next appointment, please call your pharmacy*   Lab Work: CBC, CMET, and lipid panel to be done at your preferred location.   If you have labs (blood work) drawn today and your tests are completely normal, you will receive your results only by: MyChart Message (if you have MyChart) OR A paper copy in the mail If you have any lab test that is abnormal or we need to change your treatment, we will call you to review the results.   Testing/Procedures: None   Follow-Up: At [facility], you and your health needs are our priority.  As part of our continuing mission to provide you with exceptional heart care, we have created designated Provider Care Teams.  These Care Teams include your primary Cardiologist (physician) and Advanced Practice Providers (APPs -  Physician Assistants and Nurse Practitioners) who all work together to provide you with the care you need, when you need it.   Your next appointment:   6 month(s)  The format for your next appointment:   In Person  Provider:   Debbe Odea, MD or Eula Listen, PA-C      Important Information About Sugar

## 2021-08-19 ENCOUNTER — Other Ambulatory Visit: Payer: Self-pay | Admitting: Gerontology

## 2021-08-19 ENCOUNTER — Other Ambulatory Visit: Payer: Medicaid Other

## 2021-08-19 DIAGNOSIS — Z0000 Encounter for general adult medical examination without abnormal findings: Secondary | ICD-10-CM

## 2021-08-20 LAB — COMPREHENSIVE METABOLIC PANEL
ALT: 52 IU/L — ABNORMAL HIGH (ref 0–44)
AST: 47 IU/L — ABNORMAL HIGH (ref 0–40)
Albumin/Globulin Ratio: 1.9 (ref 1.2–2.2)
Albumin: 4.4 g/dL (ref 3.9–4.9)
Alkaline Phosphatase: 94 IU/L (ref 44–121)
BUN/Creatinine Ratio: 11 (ref 10–24)
BUN: 10 mg/dL (ref 8–27)
Bilirubin Total: 0.5 mg/dL (ref 0.0–1.2)
CO2: 24 mmol/L (ref 20–29)
Calcium: 9.2 mg/dL (ref 8.6–10.2)
Chloride: 106 mmol/L (ref 96–106)
Creatinine, Ser: 0.91 mg/dL (ref 0.76–1.27)
Globulin, Total: 2.3 g/dL (ref 1.5–4.5)
Glucose: 114 mg/dL — ABNORMAL HIGH (ref 70–99)
Potassium: 4.1 mmol/L (ref 3.5–5.2)
Sodium: 143 mmol/L (ref 134–144)
Total Protein: 6.7 g/dL (ref 6.0–8.5)
eGFR: 94 mL/min/{1.73_m2} (ref >59)

## 2021-08-20 LAB — CBC WITH DIFFERENTIAL/PLATELET
Basophils Absolute: 0.1 10*3/uL (ref 0.0–0.2)
Basos: 1 %
EOS (ABSOLUTE): 0.1 10*3/uL (ref 0.0–0.4)
Eos: 1 %
Hematocrit: 42 % (ref 37.5–51.0)
Hemoglobin: 15 g/dL (ref 13.0–17.7)
Immature Grans (Abs): 0 10*3/uL (ref 0.0–0.1)
Immature Granulocytes: 1 %
Lymphocytes Absolute: 1.3 10*3/uL (ref 0.7–3.1)
Lymphs: 20 %
MCH: 34.7 pg — ABNORMAL HIGH (ref 26.6–33.0)
MCHC: 35.7 g/dL (ref 31.5–35.7)
MCV: 97 fL (ref 79–97)
Monocytes Absolute: 0.4 10*3/uL (ref 0.1–0.9)
Monocytes: 6 %
Neutrophils Absolute: 4.8 10*3/uL (ref 1.4–7.0)
Neutrophils: 71 %
Platelets: 180 10*3/uL (ref 150–450)
RBC: 4.32 x10E6/uL (ref 4.14–5.80)
RDW: 12.3 % (ref 11.6–15.4)
WBC: 6.7 10*3/uL (ref 3.4–10.8)

## 2021-08-20 LAB — LIPID PANEL
Chol/HDL Ratio: 4 ratio (ref 0.0–5.0)
Cholesterol, Total: 193 mg/dL (ref 100–199)
HDL: 48 mg/dL (ref >39)
LDL Chol Calc (NIH): 103 mg/dL — ABNORMAL HIGH (ref 0–99)
Triglycerides: 249 mg/dL — ABNORMAL HIGH (ref 0–149)
VLDL Cholesterol Cal: 42 mg/dL — ABNORMAL HIGH (ref 5–40)

## 2021-08-23 ENCOUNTER — Other Ambulatory Visit: Payer: Self-pay

## 2021-08-26 ENCOUNTER — Ambulatory Visit: Payer: Medicaid Other | Admitting: Gerontology

## 2021-08-31 ENCOUNTER — Ambulatory Visit: Payer: Medicaid Other | Admitting: Gerontology

## 2021-08-31 ENCOUNTER — Other Ambulatory Visit: Payer: Self-pay

## 2021-08-31 ENCOUNTER — Ambulatory Visit
Admission: RE | Admit: 2021-08-31 | Discharge: 2021-08-31 | Disposition: A | Discharge status: Home / Self Care | Payer: Medicaid Other | Source: Ambulatory Visit | Attending: Gerontology | Admitting: Gerontology

## 2021-08-31 ENCOUNTER — Encounter: Payer: Self-pay | Admitting: Gerontology

## 2021-08-31 VITALS — BP 154/96 | HR 96 | Temp 98.1°F | Resp 16 | Ht 69.0 in | Wt 247.5 lb

## 2021-08-31 DIAGNOSIS — R0789 Other chest pain: Secondary | ICD-10-CM | POA: Insufficient documentation

## 2021-08-31 DIAGNOSIS — W19XXXA Unspecified fall, initial encounter: Secondary | ICD-10-CM

## 2021-08-31 DIAGNOSIS — R748 Abnormal levels of other serum enzymes: Secondary | ICD-10-CM | POA: Insufficient documentation

## 2021-08-31 DIAGNOSIS — I10 Essential (primary) hypertension: Secondary | ICD-10-CM

## 2021-08-31 MED ORDER — PREDNISONE 10 MG PO TABS
10.0000 mg | ORAL_TABLET | Freq: Every day | ORAL | 0 refills | Status: DC
  Filled 2021-08-31: qty 5, 5d supply, fill #0

## 2021-08-31 NOTE — Progress Notes (Signed)
Established Patient Office Visit  Subjective   Patient ID: Jason Kakar., male    DOB: [birth year]  Age: 65 y.o. MRN: [id number]  Chief Complaint  Patient presents with   Abdominal Pain    Pt states that yesterday afternoon he stood up from bed, felt dizzy, fell and hit both head and upper left chest on a wheelchair. Believes he broke rib, says significant bruising on chest. Making breathing and walking more labored.    HPI Jason Wiggins is a 66 year old male who has history of alcohol abuse, COVID-19 virus infection, hypertension ,CAD presents for follow up of hypertension presents for routine follow up visit and lab review. Labs done on 08/19/21 was unremarkable, except  Liver enzymes were mildly elevated, AST  47 IU/L, and ALT was 52 IU/L, he drinks 2 cans of beer 3 times a week . He denies right upper quadrant pain and jaundice. Currently, he states that he fell and hit his upper left chest wall, left temporal aspect of his head on the wheel chair after he felt dizzy with getting up from the bed. He denies headache, confusion and vision changes. He states that he's experiencing constant 10/10 pain to his left rib cage and difficulty breathing. Over all, he states that he's doing well, except that he felt that he fractured his rib during the fall.   Review of Systems  Constitutional: Negative.   HENT: Negative.    Respiratory: Negative.    Cardiovascular:        Pain to left rib cage, bruises  Skin: Negative.   Neurological: Negative.   Psychiatric/Behavioral: Negative.        Objective:     BP (!) 154/96 (BP Location: Left Arm, Patient Position: Sitting, Cuff Size: Large)   Pulse 96   Temp 98.1 F (36.7 C) (Oral)   Resp 16   Ht _0  (1.753 m)   Wt 247 lb 8 oz (112.3 kg)   SpO2 96%   BMI 36.55 kg/m  BP Readings from Last 3 Encounters:  08/31/21 (!) 154/96  08/12/21 (!) 128/90  07/28/21 (!) 147/91   Wt Readings from Last 3 Encounters:  08/31/21 247 lb 8 oz (112.3  kg)  08/12/21 255 lb 2 oz (115.7 kg)  07/28/21 252 lb 9.6 oz (114.6 kg)      Physical Exam HENT:     Head: Normocephalic and atraumatic.     Mouth/Throat:     Mouth: Mucous membranes are moist.  Eyes:     Extraocular Movements: Extraocular movements intact.     Conjunctiva/sclera: Conjunctivae normal.     Pupils: Pupils are equal, round, and reactive to light.  Cardiovascular:     Rate and Rhythm: Normal rate and regular rhythm.     Pulses: Normal pulses.     Heart sounds: Normal heart sounds.  Pulmonary:     Effort: Pulmonary effort is normal.     Breath sounds: Normal breath sounds.  Musculoskeletal:        General: Tenderness (with palpation to left thoracic area) present.  Skin:    General: Skin is warm.     Coloration: Skin is not jaundiced.     Comments: 3 bruised spots to left thoracic area s/p fall  Neurological:     General: No focal deficit present.     Mental Status: He is alert and oriented to person, place, and time. Mental status is at baseline.      No results found for any  visits on 08/31/21.  Last CBC Lab Results  Component Value Date   WBC 6.7 08/19/2021   HGB 15.0 08/19/2021   HCT 42.0 08/19/2021   MCV 97 08/19/2021   MCH 34.7 (H) 08/19/2021   RDW 12.3 08/19/2021   PLT 180 56/70/1410   Last metabolic panel Lab Results  Component Value Date   GLUCOSE 114 (H) 08/19/2021   NA 143 08/19/2021   K 4.1 08/19/2021   CL 106 08/19/2021   CO2 24 08/19/2021   BUN 10 08/19/2021   CREATININE 0.91 08/19/2021   EGFR 94 08/19/2021   CALCIUM 9.2 08/19/2021   PHOS 1.9 (L) 04/22/2018   PROT 6.7 08/19/2021   ALBUMIN 4.4 08/19/2021   LABGLOB 2.3 08/19/2021   AGRATIO 1.9 08/19/2021   BILITOT 0.5 08/19/2021   ALKPHOS 94 08/19/2021   AST 47 (H) 08/19/2021   ALT 52 (H) 08/19/2021   ANIONGAP 8 02/18/2021   Last lipids Lab Results  Component Value Date   CHOL 193 08/19/2021   HDL 48 08/19/2021   LDLCALC 103 (H) 08/19/2021   TRIG 249 (H) 08/19/2021    CHOLHDL 4.0 08/19/2021   Last hemoglobin A1c Lab Results  Component Value Date   HGBA1C 5.5 07/30/2020      The 10-year ASCVD risk score (Arnett DK, et al., 2019) is: 19.9%    Assessment & Plan:   1. Left-sided chest wall pain - Pain due to fall, per patient does not tolerated Ibuprofen due to GI symptoms, was started on Prednisone 10 mg daily for inflammation, advised to take 500 mg Tylenol every 12 hours as needed for worsening pain. Educated on medication side effects and advised to notify clinic. He will have chest x ray done. He was advised to go to the ED for worsening symptoms. - DG Chest 2 View; Future - predniSONE (DELTASONE) 10 MG tablet; Take 1 tablet (10 mg total) by mouth daily with breakfast.  Dispense: 5 tablet; Refill: 0  2. Fall, initial encounter - He was advised to change position slowly and notify clinic for reoccurring dizziness.  3. Essential hypertension - His blood pressure is not under control, will continue on current medication, check blood pressure daily, record and bring log to follow up appointment. He was encouraged to continue on DASH diet and exercise as tolerated.  4. Elevated liver enzymes - He was encouraged on alcohol abstinence, will recheck in 3 months.    Return in about 1 week (around 09/07/2021), or if symptoms worsen or fail to improve.    Raynisha Avilla Jerold Coombe, NP

## 2021-08-31 NOTE — Patient Instructions (Signed)

## 2021-09-07 ENCOUNTER — Encounter: Payer: Self-pay | Admitting: Gerontology

## 2021-09-07 ENCOUNTER — Other Ambulatory Visit: Payer: Self-pay

## 2021-09-07 ENCOUNTER — Ambulatory Visit: Payer: Medicaid Other | Admitting: Gerontology

## 2021-09-07 VITALS — BP 146/87 | HR 81 | Temp 97.3°F | Resp 16 | Ht 69.0 in | Wt 251.1 lb

## 2021-09-07 DIAGNOSIS — I10 Essential (primary) hypertension: Secondary | ICD-10-CM

## 2021-09-07 DIAGNOSIS — R0789 Other chest pain: Secondary | ICD-10-CM

## 2021-09-07 NOTE — Progress Notes (Signed)
Established Patient Office Visit  Subjective   Patient ID: Jason Wiggins., male    DOB: 07-Jul-1955  Age: 66 y.o. MRN: [id number]  Chief Complaint  Patient presents with   Follow-up    HPI  Jason Wiggins. is a 66 year old male who has history of alcohol abuse, COVID-19 virus infection, hypertension, and CAD. Today he presents for follow-up of hypertension and left chest wall pain due to a fall on 08/31/21. He received a chest X-ray on 08/31/21 which showed no evidence of trauma or fracture. He states that his pain has improved since his fall. His chest wall pain was 10/10 on his last visit and is 5/10 today. He has not experienced any dizziness since his episode on 08/31/21. His blood pressure is improving; he states that he's compliant with his medications and denies side effects. He has been checking his blood pressure at home but forgot to bring his log today. His blood pressure has steadily improved since his amlodipine dose increase to 10 mg. His blood pressure today is 146/87. Overall, he states that he's doing well and offers no further complaint.  Review of Systems  Constitutional: Negative.   Eyes: Negative.   Respiratory: Negative.    Cardiovascular: Negative.   Gastrointestinal: Negative.   Genitourinary: Negative.   Musculoskeletal:        Acute L ribcage pain from fall  Neurological: Negative.   Psychiatric/Behavioral: Negative.        Objective:     BP (!) 146/87 (BP Location: Right Arm, Patient Position: Sitting, Cuff Size: Large)   Pulse 81   Temp (!) 97.3 F (36.3 C) (Oral)   Resp 16   Ht 5' 9"  (1.753 m)   Wt 251 lb 1.6 oz (113.9 kg)   SpO2 96%   BMI 37.08 kg/m  BP Readings from Last 3 Encounters:  09/07/21 (!) 146/87  08/31/21 (!) 154/96  08/12/21 (!) 128/90   Wt Readings from Last 3 Encounters:  09/07/21 251 lb 1.6 oz (113.9 kg)  08/31/21 247 lb 8 oz (112.3 kg)  08/12/21 255 lb 2 oz (115.7 kg)      Physical Exam Constitutional:      Appearance:  Normal appearance. He is obese.  HENT:     Head: Normocephalic and atraumatic.  Eyes:     Extraocular Movements: Extraocular movements intact.     Pupils: Pupils are equal, round, and reactive to light.  Cardiovascular:     Rate and Rhythm: Normal rate and regular rhythm.     Heart sounds: Normal heart sounds.  Pulmonary:     Effort: Pulmonary effort is normal.     Breath sounds: Normal breath sounds.  Musculoskeletal:        General: Tenderness (on palpation of left thoracic chest) present.  Neurological:     General: No focal deficit present.     Mental Status: He is alert and oriented to person, place, and time. Mental status is at baseline.  Psychiatric:        Mood and Affect: Mood normal.        Behavior: Behavior normal.        Thought Content: Thought content normal.        Judgment: Judgment normal.     No results found for any visits on 09/07/21.  Last CBC Lab Results  Component Value Date   WBC 6.7 08/19/2021   HGB 15.0 08/19/2021   HCT 42.0 08/19/2021   MCV 97 08/19/2021   MCH 34.7 (  H) 08/19/2021   RDW 12.3 08/19/2021   PLT 180 70/17/7939   Last metabolic panel Lab Results  Component Value Date   GLUCOSE 114 (H) 08/19/2021   NA 143 08/19/2021   K 4.1 08/19/2021   CL 106 08/19/2021   CO2 24 08/19/2021   BUN 10 08/19/2021   CREATININE 0.91 08/19/2021   EGFR 94 08/19/2021   CALCIUM 9.2 08/19/2021   PHOS 1.9 (L) 04/22/2018   PROT 6.7 08/19/2021   ALBUMIN 4.4 08/19/2021   LABGLOB 2.3 08/19/2021   AGRATIO 1.9 08/19/2021   BILITOT 0.5 08/19/2021   ALKPHOS 94 08/19/2021   AST 47 (H) 08/19/2021   ALT 52 (H) 08/19/2021   ANIONGAP 8 02/18/2021   Last lipids Lab Results  Component Value Date   CHOL 193 08/19/2021   HDL 48 08/19/2021   LDLCALC 103 (H) 08/19/2021   TRIG 249 (H) 08/19/2021   CHOLHDL 4.0 08/19/2021      The 10-year ASCVD risk score (Arnett DK, et al., 2019) is: 18.3%    Assessment & Plan:   Left-sided chest wall pain - He was  advised to continue his Tylenol regimen of 500 mg every 12 hours as needed if pain increases. He was advised to go to the ED if symptoms worsen.  Essential hypertension - He was advised to continue taking his amlodipine as ordered, as his blood pressure is improving, DASH diet and exercise as tolerated. He was advised to continue taking his blood pressure daily and record the values.  Return in approximately 3 months (12/08/21), or if symptoms worsen or fail to improve.   Odelia Gage

## 2021-10-08 ENCOUNTER — Other Ambulatory Visit: Payer: Self-pay

## 2021-11-18 ENCOUNTER — Other Ambulatory Visit: Payer: Self-pay | Admitting: Gerontology

## 2021-11-18 ENCOUNTER — Other Ambulatory Visit: Payer: Self-pay

## 2021-11-18 DIAGNOSIS — R0981 Nasal congestion: Secondary | ICD-10-CM

## 2021-11-18 MED ORDER — FLUTICASONE PROPIONATE 50 MCG/ACT NA SUSP
2.0000 | Freq: Every day | NASAL | 0 refills | Status: DC
  Filled 2021-11-18: qty 16, 30d supply, fill #0

## 2021-11-25 ENCOUNTER — Encounter: Payer: Self-pay | Admitting: Gerontology

## 2021-11-25 ENCOUNTER — Other Ambulatory Visit: Payer: Self-pay

## 2021-11-25 ENCOUNTER — Ambulatory Visit: Payer: Medicare Other | Admitting: Gerontology

## 2021-11-25 VITALS — BP 153/92 | HR 99 | Temp 97.7°F | Resp 17 | Wt 242.7 lb

## 2021-11-25 DIAGNOSIS — J029 Acute pharyngitis, unspecified: Secondary | ICD-10-CM

## 2021-11-25 DIAGNOSIS — R051 Acute cough: Secondary | ICD-10-CM

## 2021-11-25 DIAGNOSIS — R0602 Shortness of breath: Secondary | ICD-10-CM

## 2021-11-25 DIAGNOSIS — I10 Essential (primary) hypertension: Secondary | ICD-10-CM

## 2021-11-25 DIAGNOSIS — R0981 Nasal congestion: Secondary | ICD-10-CM

## 2021-11-25 MED ORDER — FLUTICASONE PROPIONATE 50 MCG/ACT NA SUSP
2.0000 | Freq: Every day | NASAL | 0 refills | Status: DC
  Filled 2021-11-25: qty 16, 30d supply, fill #0

## 2021-11-25 MED ORDER — IPRATROPIUM-ALBUTEROL 20-100 MCG/ACT IN AERS
2.0000 | INHALATION_SPRAY | Freq: Two times a day (BID) | RESPIRATORY_TRACT | 0 refills | Status: AC
  Filled 2021-11-25: qty 4, 30d supply, fill #0

## 2021-11-25 MED ORDER — BENZONATATE 100 MG PO CAPS
100.0000 mg | ORAL_CAPSULE | Freq: Three times a day (TID) | ORAL | 0 refills | Status: DC | PRN
  Filled 2021-11-25: qty 20, 7d supply, fill #0

## 2021-11-25 MED ORDER — ALBUTEROL SULFATE HFA 108 (90 BASE) MCG/ACT IN AERS
1.0000 | INHALATION_SPRAY | Freq: Four times a day (QID) | RESPIRATORY_TRACT | 0 refills | Status: AC | PRN
  Filled 2021-11-25: qty 6.7, 25d supply, fill #0

## 2021-11-25 NOTE — Progress Notes (Signed)
Established Patient Office Visit  Subjective   Patient ID: Jason Koppen., male    DOB: 06-21-1955  Age: 66 y.o. MRN: [id number]  No chief complaint on file.   HPI  Jason Vanderloop. is a 66 year old male who has history of alcohol abuse, COVID-19 virus infection, hypertension, and CAD who presents today for c/o of sore throat, productive cough with light greenish phelgm, choking sensation, nasal congestion, and intermittent shortness of breath,that has been going on for 2 weeks ago. Jason Wiggins states that cough keeps him up at night. Jason Wiggins denies fever  and chills and states that his home Covid test was negative yesterday and Jason Wiggins declined Influenza vaccine. Jason Wiggins states that his brother in law is getting over Flu infection. Overall, Jason Wiggins states that Jason Wiggins's doing well and offers no further complaint. Jason Wiggins has active Medicare and today will be his last visit.   Patient Active Problem List   Diagnosis Date Noted   Sore throat 11/25/2021   Left-sided chest wall pain 08/31/2021   Elevated liver enzymes 08/31/2021   OSA (obstructive sleep apnea) 01/18/2021   Asthma 01/18/2021   Preop pulmonary/respiratory exam 01/18/2021   Nasal congestion 08/06/2020   Knee pain, acute 03/13/2020   Shoulder pain, acute 03/13/2020   Coronary artery disease 03/13/2020   Hyperglycemia 03/13/2020   Fall 03/13/2020   Post covid-19 condition, unspecified 02/14/2020   Rash 09/12/2019   Generalized body aches 08/08/2019   Decreased breath sounds 06/13/2019   Common cold 12/18/2018   Cough 12/18/2018   Essential hypertension 10/16/2018   Contact dermatitis due to poison ivy 09/18/2018   Chest pain on exertion 09/18/2018   Edema of both lower extremities 08/22/2018   Pleuritic chest pain 08/21/2018   Encounter to establish care 08/21/2018   History of coronary artery disease 08/21/2018   Shortness of breath 08/21/2018   Morbid obesity ([place]) 08/21/2018   Sepsis ([place]) 04/26/2018   Suspected COVID-19 virus infection 04/21/2018    Hypoxia 03/21/2018   Bleach ingestion 08/14/2017   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 10/2018 Echo: EF 55-60%, borderline LVH. Diast dysfxn. No significant valvular dzs.   HTN (hypertension)    Myocardial infarction ([place])    Nonobstructive CAD (coronary artery disease)    a. 10/2016 Cath: LM nl, LAD 82m(FFR nl @ 0.83), LCX nl, RCA nl, EF nl; b. 10/2018 MV: no ischemia/infarct. EF 55-60%. Low risk.   Pneumonia    Past Surgical History:  Procedure Laterality Date   CARDIAC CATHETERIZATION     HERNIA REPAIR     Right inguinal   INTRAVASCULAR PRESSURE WIRE/FFR STUDY N/A 10/28/2016   Procedure: INTRAVASCULAR PRESSURE WIRE/FFR STUDY;  Surgeon: HLeonie Man MD;  [facility];  Service: Cardiovascular;  Laterality: N/A;   LEFT HEART CATH AND CORONARY ANGIOGRAPHY N/A 10/28/2016   Procedure: LEFT HEART CATH AND CORONARY ANGIOGRAPHY;  Surgeon: HLeonie Man MD;  [facility];  Service: Cardiovascular;  Laterality: N/A;   SHOULDER  ARTHROSCOPY WITH ROTATOR CUFF REPAIR AND SUBACROMIAL DECOMPRESSION Left 02/22/2021   Procedure: SHOULDER ARTHROSCOPY WITH ROTATOR CUFF REPAIR VERSUS ROTATOR CUFF DEBRIDEMENT AND SUBACROMIAL DECOMPRESSION;  Surgeon: Tania Ade, MD;  Location: [facility];  Service: Orthopedics;  Laterality: Left;   Social History   Tobacco Use   Smoking status: Never    Passive exposure: Current   Smokeless tobacco: Never  Vaping Use   Vaping Use: Never used  Substance Use  Topics   Alcohol use: Yes    Alcohol/week: 12.0 standard drinks of alcohol    Types: 12 Cans of beer per week    Comment: 6 pack a day 04/21/21, currently 2-3 beers/day   Drug use: No   No Known Allergies    Review of Systems  Constitutional: Negative.   HENT:  Positive for congestion and sore throat.   Respiratory:  Positive for cough (productive cough with light green phlegm), shortness of breath and wheezing.   Cardiovascular: Negative.   Skin: Negative.   Neurological: Negative.   Psychiatric/Behavioral: Negative.        Objective:     BP (!) 153/92 (BP Location: Right Arm, Patient Position: Sitting, Cuff Size: Large)   Pulse 99   Temp 97.7 F (36.5 C)   Resp 17   Wt 242 lb 11.2 oz (110.1 kg)   SpO2 94%   BMI 35.84 kg/m  BP Readings from Last 3 Encounters:  11/25/21 (!) 153/92  09/07/21 (!) 146/87  08/31/21 (!) 154/96   Wt Readings from Last 3 Encounters:  11/25/21 242 lb 11.2 oz (110.1 kg)  09/07/21 251 lb 1.6 oz (113.9 kg)  08/31/21 247 lb 8 oz (112.3 kg)      Physical Exam HENT:     Head: Normocephalic and atraumatic.     Nose: Nose normal.     Mouth/Throat:     Mouth: Mucous membranes are moist.  Eyes:     Extraocular Movements: Extraocular movements intact.     Conjunctiva/sclera: Conjunctivae normal.     Pupils: Pupils are equal, round, and reactive to light.  Cardiovascular:     Rate and Rhythm: Normal rate and regular rhythm.     Pulses: Normal pulses.     Heart sounds: Normal heart sounds.  Pulmonary:     Effort: Pulmonary effort is normal.     Breath sounds: Normal breath sounds.  Neurological:     General: No focal deficit present.     Mental Status: Jason Wiggins is alert and oriented to person, place, and time. Mental status is at baseline.  Psychiatric:        Mood and Affect: Mood normal.        Behavior: Behavior normal.        Thought Content: Thought content normal.        Judgment: Judgment normal.      No results found for any  visits on 11/25/21.  Last CBC Lab Results  Component Value Date   WBC 6.7 08/19/2021   HGB 15.0 08/19/2021   HCT 42.0 08/19/2021   MCV 97 08/19/2021   MCH 34.7 (H) 08/19/2021   RDW 12.3 08/19/2021   PLT 180 54/56/2563   Last metabolic panel Lab Results  Component Value Date   GLUCOSE 114 (H) 08/19/2021   NA 143 08/19/2021   K 4.1 08/19/2021   CL 106 08/19/2021   CO2 24 08/19/2021   BUN 10 08/19/2021   CREATININE 0.91 08/19/2021   EGFR 94 08/19/2021   CALCIUM 9.2 08/19/2021  PHOS 1.9 (L) 04/22/2018   PROT 6.7 08/19/2021   ALBUMIN 4.4 08/19/2021   LABGLOB 2.3 08/19/2021   AGRATIO 1.9 08/19/2021   BILITOT 0.5 08/19/2021   ALKPHOS 94 08/19/2021   AST 47 (H) 08/19/2021   ALT 52 (H) 08/19/2021   ANIONGAP 8 02/18/2021   Last lipids Lab Results  Component Value Date   CHOL 193 08/19/2021   HDL 48 08/19/2021   LDLCALC 103 (H) 08/19/2021   TRIG 249 (H) 08/19/2021   CHOLHDL 4.0 08/19/2021   Last hemoglobin A1c Lab Results  Component Value Date   HGBA1C 5.5 07/30/2020      The 10-year ASCVD risk score (Arnett DK, et al., 2019) is: 19.7%    Assessment & Plan:   1. Nasal congestion - Jason Wiggins was educated on medication use and side effects and was advised to notify clinic. - fluticasone (FLONASE) 50 MCG/ACT nasal spray; Place 2 sprays into both nostrils daily.  Dispense: 16 g; Refill: 0  2. Acute cough - Jason Wiggins was advised to increase fluid intake, educated on medication side effects and advised to notify clinic. - benzonatate (TESSALON PERLES) 100 MG capsule; Take 1 capsule (100 mg total) by mouth 3 (three) times daily as needed.  Dispense: 20 capsule; Refill: 0  3. Sore throat -  No erythema, purulent drainage or enlarged tonsils. Jason Wiggins was advised to increase fluid intake and go to ED for worsening symptoms.  4. Shortness of breath - Jason Wiggins was advised to use albuterol as needed and go to the ED for worsening symptoms. - albuterol (VENTOLIN HFA) 108 (90 Base) MCG/ACT  inhaler; Inhale 1-2 puffs into the lungs every 6 (six) hours as needed for wheezing or shortness of breath.  Dispense: 6.7 g; Refill: 0 - Ipratropium-Albuterol (COMBIVENT) 20-100 MCG/ACT AERS respimat; Inhale 2 puffs into the lungs in the morning and at bedtime.  Dispense: 4 g; Refill: 0  5. Essential hypertension - His blood pressure is elevated, advised to continue on current medication, DASH diet and exercise as tolerated. Jason Wiggins was educated on the signs and symptoms of stroke and advised to go to the ED.    Return if symptoms worsen or fail to improve. Jason Wiggins has no follow up visit due to active Medicare. [place] wishes him well with his care.   Neizan Debruhl Jerold Coombe, NP

## 2021-11-25 NOTE — Patient Instructions (Signed)

## 2021-12-08 ENCOUNTER — Ambulatory Visit: Payer: Self-pay | Admitting: Gerontology

## 2021-12-22 ENCOUNTER — Ambulatory Visit
Admission: EM | Admit: 2021-12-22 | Discharge: 2021-12-22 | Disposition: A | Discharge status: Home / Self Care | Payer: Medicare Other

## 2021-12-22 DIAGNOSIS — R1031 Right lower quadrant pain: Secondary | ICD-10-CM | POA: Diagnosis not present

## 2021-12-22 NOTE — ED Provider Notes (Signed)
Jason Wiggins    CSN: [id number] Arrival date & time: 12/22/21  1149      History   Chief Complaint Chief Complaint  Patient presents with   Groin Pain    HPI Antwon Rochin. is a 66 y.o. male.    Groin Pain    Patient presents to urgent care with complaint of pain on the right side of his groin.  He states pain started after coughing and endorses shooting pain down the front of his right leg which makes it difficult to walk.  Past surgical history includes right inguinal surgery repair with "net".  Patient states this surgery was performed while incarcerated about 20 years ago.  States he is not interested in evaluation for inguinal hernia but wants treatment for the pain.  Past Medical History:  Diagnosis Date   Alcohol abuse    possibly as much as 24 beers per day,in past   Arthritis    COVID-19 virus infection 02/2019   History of arthroscopic surgery of shoulder 2023   left   History of echocardiogram    a. 10/2018 Echo: EF 55-60%, borderline LVH. Diast dysfxn. No significant valvular dzs.   HTN (hypertension)    Myocardial infarction ([facility])    Nonobstructive CAD (coronary artery disease)    a. 10/2016 Cath: LM nl, LAD 57m (FFR nl @ 0.83), LCX nl, RCA nl, EF nl; b. 10/2018 MV: no ischemia/infarct. EF 55-60%. Low risk.   Pneumonia     Patient Active Problem List   Diagnosis Date Noted   Sore throat 11/25/2021   Left-sided chest wall pain 08/31/2021   Elevated liver enzymes 08/31/2021   OSA (obstructive sleep apnea) 01/18/2021   Asthma 01/18/2021   Preop pulmonary/respiratory exam 01/18/2021   Nasal congestion 08/06/2020   Knee pain, acute 03/13/2020   Shoulder pain, acute 03/13/2020   Coronary artery disease 03/13/2020   Hyperglycemia 03/13/2020   Fall 03/13/2020   Post covid-19 condition, unspecified 02/14/2020   Rash 09/12/2019   Generalized body aches 08/08/2019   Decreased breath sounds 06/13/2019   Common cold 12/18/2018   Cough  12/18/2018   Essential hypertension 10/16/2018   Contact dermatitis due to poison ivy 09/18/2018   Chest pain on exertion 09/18/2018   Edema of both lower extremities 08/22/2018   Pleuritic chest pain 08/21/2018   Encounter to establish care 08/21/2018   History of coronary artery disease 08/21/2018   Shortness of breath 08/21/2018   Morbid obesity ([facility]) 08/21/2018   Sepsis ([facility]) 04/26/2018   Suspected COVID-19 virus infection 04/21/2018   Hypoxia 03/21/2018   Bleach ingestion 08/14/2017   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   Substance induced mood disorder ([facility]) 08/14/2017   Alcohol abuse 08/14/2017   Unstable angina ([facility]) 10/22/2016   Hyperlipidemia 10/22/2016   Medication management 10/22/2016    Past Surgical History:  Procedure Laterality Date   CARDIAC CATHETERIZATION     HERNIA REPAIR     Right inguinal   INTRAVASCULAR PRESSURE WIRE/FFR STUDY N/A 10/28/2016   Procedure: INTRAVASCULAR PRESSURE WIRE/FFR STUDY;  Surgeon: Marykay Lex, MD;  Location: [facility];  Service: Cardiovascular;  Laterality: N/A;   LEFT HEART CATH AND CORONARY ANGIOGRAPHY N/A 10/28/2016   Procedure: LEFT HEART CATH AND CORONARY ANGIOGRAPHY;  Surgeon: Marykay Lex, MD;  Location: [facility];  Service: Cardiovascular;  Laterality: N/A;   SHOULDER ARTHROSCOPY WITH ROTATOR CUFF REPAIR AND SUBACROMIAL DECOMPRESSION Left 02/22/2021   Procedure: SHOULDER  ARTHROSCOPY WITH ROTATOR CUFF REPAIR VERSUS ROTATOR CUFF DEBRIDEMENT AND SUBACROMIAL DECOMPRESSION;  Surgeon: Jones Broom, MD;  Location: [facility];  Service: Orthopedics;  Laterality: Left;       Home Medications    Prior to Admission medications   Medication Sig Start Date End Date Taking? Authorizing Provider  albuterol (VENTOLIN HFA) 108 (90 Base) MCG/ACT inhaler Inhale 1-2 puffs into the lungs every 6 (six) hours as needed for wheezing or shortness of breath. 11/25/21   Iloabachie,  Chioma E, NP  amLODipine (NORVASC) 10 MG tablet Take 1 tablet (10 mg total) by mouth daily. 08/12/21 02/23/22  Sondra Barges, PA-C  aspirin EC 81 MG tablet Take 81 mg by mouth daily. Swallow whole.    [provider]  benzonatate (TESSALON PERLES) 100 MG capsule Take 1 capsule (100 mg total) by mouth 3 (three) times daily as needed. 11/25/21   Iloabachie, Chioma E, NP  fluticasone (FLONASE) 50 MCG/ACT nasal spray Place 2 sprays into both nostrils daily. 11/25/21   Iloabachie, Chioma E, NP  Ipratropium-Albuterol (COMBIVENT) 20-100 MCG/ACT AERS respimat Inhale 2 puffs into the lungs in the morning and at bedtime. 11/25/21   Iloabachie, Chioma E, NP  isosorbide mononitrate (IMDUR) 30 MG 24 hr tablet Take 1 tablet (30 mg total) by mouth daily. 10/24/19 01/29/20  Debbe Odea, MD  pravastatin (PRAVACHOL) 40 MG tablet Take 1 tablet (40 mg total) by mouth every evening. 10/24/19 01/29/20  Debbe Odea, MD    Family History Family History  Problem Relation Age of Onset   Cancer Mother    Cancer Father    Cancer Sister        throat and/or lung   Cancer Maternal Grandmother    Cancer Maternal Grandfather    Other Paternal Grandmother        unknown medical history   Other Paternal Grandfather        unknown medical history    Social History Social History   Tobacco Use   Smoking status: Never    Passive exposure: Current   Smokeless tobacco: Never  Vaping Use   Vaping Use: Never used  Substance Use Topics   Alcohol use: Yes    Alcohol/week: 12.0 standard drinks of alcohol    Types: 12 Cans of beer per week    Comment: 6 pack a day 04/21/21, currently 2-3 beers/day   Drug use: No     Allergies   Patient has no known allergies.   Review of Systems Review of Systems   Physical Exam Triage Vital Signs ED Triage Vitals  Enc Vitals Group     BP 12/22/21 1217 (!) 153/102     Pulse Rate 12/22/21 1217 72     Resp 12/22/21 1217 17     Temp 12/22/21 1217 98.1 F  (36.7 C)     Temp src --      SpO2 12/22/21 1217 96 %     Weight --      Height --      Head Circumference --      Peak Flow --      Pain Score 12/22/21 1218 10     Pain Loc --      Pain Edu? --      Excl. in GC? --    No data found.  Updated Vital Signs BP (!) 153/102   Pulse 72   Temp 98.1 F (36.7 C)   Resp 17   SpO2 96%   Visual Acuity Right  Eye Distance:   Left Eye Distance:   Bilateral Distance:    Right Eye Near:   Left Eye Near:    Bilateral Near:     Physical Exam Vitals reviewed.  Constitutional:      Appearance: Normal appearance.  Skin:    General: Skin is warm and dry.  Neurological:     General: No focal deficit present.     Mental Status: He is alert and oriented to person, place, and time.  Psychiatric:        Mood and Affect: Mood normal.        Behavior: Behavior normal.      UC Treatments / Results  Labs (all labs ordered are listed, but only abnormal results are displayed) Labs Reviewed - No data to display  EKG   Radiology No results found.  Procedures Procedures (including critical care time)  Medications Ordered in UC Medications - No data to display  Initial Impression / Assessment and Plan / UC Course  I have reviewed the triage vital signs and the nursing notes.  Pertinent labs & imaging results that were available during my care of the patient were reviewed by me and considered in my medical decision making (see chart for details).   Possible recurrence of right inguinal hernia and recommended patient establish care with primary care in order that he might have this evaluated with imaging.  Informed patient that I could not prescribe anything stronger than acetaminophen or ibuprofen.  He states he is unable to take NSAIDs because of irritation to his stomach.  Recommended he use Tylenol for his pain and seek evaluation for possible recurrence of inguinal hernia.   Final Clinical Impressions(s) / UC Diagnoses   Final  diagnoses:  None   Discharge Instructions   None    ED Prescriptions   None    PDMP not reviewed this encounter.   Charma Igo, [place] 12/22/21 1241

## 2021-12-22 NOTE — Discharge Instructions (Signed)
Recommend evaluation for recurrence of right inguinal hernia with imaging.  Please establish care with a primary care doctor who can refer you for ultrasound or CT.    Marland Kitchen

## 2021-12-22 NOTE — ED Triage Notes (Signed)
Pt. Presents to UC w/ c/o pain on the side of his right groin. Pt. States it occurred after coughing and since then he has had limited ROM in his right leg.

## 2022-01-17 ENCOUNTER — Other Ambulatory Visit: Payer: Self-pay

## 2022-01-28 DIAGNOSIS — Z1389 Encounter for screening for other disorder: Secondary | ICD-10-CM | POA: Diagnosis not present

## 2022-01-28 DIAGNOSIS — F1099 Alcohol use, unspecified with unspecified alcohol-induced disorder: Secondary | ICD-10-CM | POA: Diagnosis not present

## 2022-01-28 DIAGNOSIS — I10 Essential (primary) hypertension: Secondary | ICD-10-CM | POA: Diagnosis not present

## 2022-01-28 DIAGNOSIS — Z131 Encounter for screening for diabetes mellitus: Secondary | ICD-10-CM | POA: Diagnosis not present

## 2022-01-28 DIAGNOSIS — Z1159 Encounter for screening for other viral diseases: Secondary | ICD-10-CM | POA: Diagnosis not present

## 2022-01-28 DIAGNOSIS — Z0131 Encounter for examination of blood pressure with abnormal findings: Secondary | ICD-10-CM | POA: Diagnosis not present

## 2022-01-28 DIAGNOSIS — Z0000 Encounter for general adult medical examination without abnormal findings: Secondary | ICD-10-CM | POA: Diagnosis not present

## 2022-01-28 DIAGNOSIS — I213 ST elevation (STEMI) myocardial infarction of unspecified site: Secondary | ICD-10-CM | POA: Diagnosis not present

## 2022-01-28 DIAGNOSIS — M25551 Pain in right hip: Secondary | ICD-10-CM | POA: Diagnosis not present

## 2022-01-28 DIAGNOSIS — E669 Obesity, unspecified: Secondary | ICD-10-CM | POA: Diagnosis not present

## 2022-01-28 DIAGNOSIS — Z113 Encounter for screening for infections with a predominantly sexual mode of transmission: Secondary | ICD-10-CM | POA: Diagnosis not present

## 2022-01-28 DIAGNOSIS — Z1331 Encounter for screening for depression: Secondary | ICD-10-CM | POA: Diagnosis not present

## 2022-01-28 DIAGNOSIS — Z13220 Encounter for screening for lipoid disorders: Secondary | ICD-10-CM | POA: Diagnosis not present

## 2022-01-28 DIAGNOSIS — R062 Wheezing: Secondary | ICD-10-CM | POA: Diagnosis not present

## 2022-02-14 DIAGNOSIS — Z0131 Encounter for examination of blood pressure with abnormal findings: Secondary | ICD-10-CM | POA: Diagnosis not present

## 2022-02-14 DIAGNOSIS — Z1389 Encounter for screening for other disorder: Secondary | ICD-10-CM | POA: Diagnosis not present

## 2022-02-14 DIAGNOSIS — M25551 Pain in right hip: Secondary | ICD-10-CM | POA: Diagnosis not present

## 2022-02-15 DIAGNOSIS — M25551 Pain in right hip: Secondary | ICD-10-CM | POA: Diagnosis not present

## 2022-03-02 DIAGNOSIS — M545 Low back pain, unspecified: Secondary | ICD-10-CM | POA: Diagnosis not present

## 2022-03-02 DIAGNOSIS — M79604 Pain in right leg: Secondary | ICD-10-CM | POA: Diagnosis not present

## 2022-03-02 DIAGNOSIS — M25551 Pain in right hip: Secondary | ICD-10-CM | POA: Diagnosis not present

## 2022-03-07 DIAGNOSIS — M545 Low back pain, unspecified: Secondary | ICD-10-CM | POA: Diagnosis not present

## 2022-03-07 DIAGNOSIS — M25551 Pain in right hip: Secondary | ICD-10-CM | POA: Diagnosis not present

## 2022-03-15 DIAGNOSIS — M545 Low back pain, unspecified: Secondary | ICD-10-CM | POA: Diagnosis not present

## 2022-03-15 DIAGNOSIS — M25551 Pain in right hip: Secondary | ICD-10-CM | POA: Diagnosis not present

## 2022-03-18 DIAGNOSIS — Z0189 Encounter for other specified special examinations: Secondary | ICD-10-CM | POA: Diagnosis not present

## 2022-03-18 DIAGNOSIS — M87851 Other osteonecrosis, right femur: Secondary | ICD-10-CM | POA: Diagnosis not present

## 2022-03-21 DIAGNOSIS — M879 Osteonecrosis, unspecified: Secondary | ICD-10-CM | POA: Insufficient documentation

## 2022-03-25 DIAGNOSIS — M545 Low back pain, unspecified: Secondary | ICD-10-CM | POA: Diagnosis not present

## 2022-04-04 DIAGNOSIS — M87059 Idiopathic aseptic necrosis of unspecified femur: Secondary | ICD-10-CM | POA: Diagnosis not present

## 2022-04-04 DIAGNOSIS — Z1389 Encounter for screening for other disorder: Secondary | ICD-10-CM | POA: Diagnosis not present

## 2022-04-04 DIAGNOSIS — Z0131 Encounter for examination of blood pressure with abnormal findings: Secondary | ICD-10-CM | POA: Diagnosis not present

## 2022-04-04 DIAGNOSIS — I10 Essential (primary) hypertension: Secondary | ICD-10-CM | POA: Diagnosis not present

## 2022-04-04 DIAGNOSIS — M4800 Spinal stenosis, site unspecified: Secondary | ICD-10-CM | POA: Diagnosis not present

## 2022-04-04 DIAGNOSIS — I2510 Atherosclerotic heart disease of native coronary artery without angina pectoris: Secondary | ICD-10-CM | POA: Diagnosis not present

## 2022-04-13 DIAGNOSIS — Z01812 Encounter for preprocedural laboratory examination: Secondary | ICD-10-CM | POA: Diagnosis not present

## 2022-04-26 DIAGNOSIS — M87851 Other osteonecrosis, right femur: Secondary | ICD-10-CM | POA: Diagnosis not present

## 2022-04-28 DIAGNOSIS — Z79899 Other long term (current) drug therapy: Secondary | ICD-10-CM | POA: Diagnosis not present

## 2022-04-28 DIAGNOSIS — Z79891 Long term (current) use of opiate analgesic: Secondary | ICD-10-CM | POA: Diagnosis not present

## 2022-04-28 DIAGNOSIS — M1611 Unilateral primary osteoarthritis, right hip: Secondary | ICD-10-CM | POA: Diagnosis not present

## 2022-04-28 DIAGNOSIS — M87851 Other osteonecrosis, right femur: Secondary | ICD-10-CM | POA: Diagnosis not present

## 2022-05-02 DIAGNOSIS — Z96649 Presence of unspecified artificial hip joint: Secondary | ICD-10-CM | POA: Insufficient documentation

## 2022-05-05 DIAGNOSIS — M25551 Pain in right hip: Secondary | ICD-10-CM | POA: Diagnosis not present

## 2022-05-09 DIAGNOSIS — M25551 Pain in right hip: Secondary | ICD-10-CM | POA: Diagnosis not present

## 2022-07-28 DIAGNOSIS — M461 Sacroiliitis, not elsewhere classified: Secondary | ICD-10-CM | POA: Insufficient documentation

## 2022-07-28 DIAGNOSIS — M47816 Spondylosis without myelopathy or radiculopathy, lumbar region: Secondary | ICD-10-CM | POA: Insufficient documentation

## 2022-09-04 ENCOUNTER — Encounter: Payer: Self-pay | Admitting: Emergency Medicine

## 2022-09-04 ENCOUNTER — Other Ambulatory Visit: Payer: Self-pay

## 2022-09-04 ENCOUNTER — Emergency Department: Payer: Medicare HMO

## 2022-09-04 ENCOUNTER — Emergency Department
Admission: EM | Admit: 2022-09-04 | Discharge: 2022-09-04 | Discharge status: Against Medical Advice | Payer: Medicare HMO | Attending: Emergency Medicine | Admitting: Emergency Medicine

## 2022-09-04 DIAGNOSIS — R079 Chest pain, unspecified: Secondary | ICD-10-CM | POA: Diagnosis present

## 2022-09-04 DIAGNOSIS — J45909 Unspecified asthma, uncomplicated: Secondary | ICD-10-CM | POA: Diagnosis not present

## 2022-09-04 DIAGNOSIS — U071 COVID-19: Secondary | ICD-10-CM | POA: Insufficient documentation

## 2022-09-04 DIAGNOSIS — I10 Essential (primary) hypertension: Secondary | ICD-10-CM | POA: Insufficient documentation

## 2022-09-04 DIAGNOSIS — R000 Tachycardia, unspecified: Secondary | ICD-10-CM

## 2022-09-04 DIAGNOSIS — I2510 Atherosclerotic heart disease of native coronary artery without angina pectoris: Secondary | ICD-10-CM | POA: Insufficient documentation

## 2022-09-04 LAB — BASIC METABOLIC PANEL
Anion gap: 12 (ref 5–15)
BUN: 13 mg/dL (ref 8–23)
CO2: 23 mmol/L (ref 22–32)
Calcium: 8.7 mg/dL — ABNORMAL LOW (ref 8.9–10.3)
Chloride: 102 mmol/L (ref 98–111)
Creatinine, Ser: 0.95 mg/dL (ref 0.61–1.24)
GFR, Estimated: >60 mL/min (ref >60)
Glucose, Bld: 110 mg/dL — ABNORMAL HIGH (ref 70–99)
Potassium: 3.7 mmol/L (ref 3.5–5.1)
Sodium: 137 mmol/L (ref 135–145)

## 2022-09-04 LAB — D-DIMER, QUANTITATIVE: D-Dimer, Quant: 0.65 ug{FEU}/mL — ABNORMAL HIGH (ref 0.00–0.50)

## 2022-09-04 LAB — CBC
HCT: 38.9 % — ABNORMAL LOW (ref 39.0–52.0)
Hemoglobin: 14 g/dL (ref 13.0–17.0)
MCH: 34.9 pg — ABNORMAL HIGH (ref 26.0–34.0)
MCHC: 36 g/dL (ref 30.0–36.0)
MCV: 97 fL (ref 80.0–100.0)
Platelets: 152 10*3/uL (ref 150–400)
RBC: 4.01 MIL/uL — ABNORMAL LOW (ref 4.22–5.81)
RDW: 12.3 % (ref 11.5–15.5)
WBC: 8.2 10*3/uL (ref 4.0–10.5)
nRBC: 0 % (ref 0.0–0.2)

## 2022-09-04 LAB — RESP PANEL BY RT-PCR (RSV, FLU A&B, COVID)  RVPGX2
Influenza A by PCR: NEGATIVE
Influenza B by PCR: NEGATIVE
Resp Syncytial Virus by PCR: NEGATIVE
SARS Coronavirus 2 by RT PCR: POSITIVE — AB

## 2022-09-04 LAB — TROPONIN I (HIGH SENSITIVITY)
Troponin I (High Sensitivity): 13 ng/L (ref <18)
Troponin I (High Sensitivity): 12 ng/L (ref <18)

## 2022-09-04 MED ORDER — IOHEXOL 350 MG/ML SOLN
100.0000 mL | Freq: Once | INTRAVENOUS | Status: DC | PRN
Start: 2022-09-04 — End: 2022-09-04

## 2022-09-04 MED ORDER — NITROGLYCERIN 0.4 MG SL SUBL
0.4000 mg | SUBLINGUAL_TABLET | Freq: Once | SUBLINGUAL | Status: AC
  Administered 2022-09-04: 0.4 mg via SUBLINGUAL
  Filled 2022-09-04: qty 1

## 2022-09-04 NOTE — ED Triage Notes (Signed)
Patient to ED via POV for left sided chest pain. Started in the middle night. States he took a nitroglycerin approx 20 minutes ago. Pain eased off after nitroglycerin but still there. Also has a cough that started during the night.

## 2022-09-04 NOTE — ED Provider Notes (Signed)
[facility] Provider Note    Event Date/Time   First MD Initiated Contact with Patient 09/04/22 1644     (approximate)   History   Chest Pain   HPI Jason Wiggins. is a 67 y.o. male with CAD, unstable angina, HTN, asthma who presents today for chest pain.  Patient states last night he woke up in a coughing fit coughing up clear phlegm.  It is developed into yellow-colored phlegm today.  He also started noticing chest pain under his rib cage last night that is developed more into left-sided chest pain.  He states he does feel like his prior heart attack.  Is also had congestion symptoms and was sweating last night.  Denies nausea, vomiting, abdominal pain, leg swelling, or leg pain.  No prior history of blood clots.     Physical Exam   Triage Vital Signs: ED Triage Vitals  Encounter Vitals Group     BP 09/04/22 1603 117/85     Systolic BP Percentile --      Diastolic BP Percentile --      Pulse Rate 09/04/22 1603 (!) 122     Resp 09/04/22 1603 18     Temp 09/04/22 1603 98.4 F (36.9 C)     Temp Source 09/04/22 1603 Oral     SpO2 09/04/22 1603 96 %     Weight 09/04/22 1601 260 lb (117.9 kg)     Height 09/04/22 1601 5\' 9"  (1.753 m)     Head Circumference --      Peak Flow --      Pain Score 09/04/22 1601 10     Pain Loc --      Pain Education --      Exclude from Growth Chart --     Most recent vital signs: Vitals:   09/04/22 1730 09/04/22 1800  BP: 134/78 135/80  Pulse: (!) 101 (!) 104  Resp: (!) 32 (!) 25  Temp:    SpO2: 100% 100%   Physical Exam: I have reviewed the vital signs and nursing notes. General: Awake, alert, no acute distress.  Nontoxic appearing. Head:  Atraumatic, normocephalic.   ENT:  EOM intact, PERRL. Oral mucosa is pink and moist with no lesions. Neck: Neck is supple with full range of motion, No meningeal signs. Cardiovascular:  RRR, No murmurs. Peripheral pulses palpable and equal bilaterally. Respiratory:   Symmetrical chest wall expansion.  No rhonchi, rales, or wheezes.  Good air movement throughout.  No use of accessory muscles.  Coughing frequently on exam. Musculoskeletal:  No cyanosis or edema. Moving extremities with full ROM Abdomen:  Soft, nontender, nondistended. Neuro:  GCS 15, moving all four extremities, interacting appropriately. Speech clear. Psych:  Calm, appropriate.   Skin:  Warm, dry, no rash.     ED Results / Procedures / Treatments   Labs (all labs ordered are listed, but only abnormal results are displayed) Labs Reviewed  RESP PANEL BY RT-PCR (RSV, FLU A&B, COVID)  RVPGX2 - Abnormal; Notable for the following components:      Result Value   SARS Coronavirus 2 by RT PCR POSITIVE (*)    All other components within normal limits  BASIC METABOLIC PANEL - Abnormal; Notable for the following components:   Glucose, Bld 110 (*)    Calcium 8.7 (*)    All other components within normal limits  CBC - Abnormal; Notable for the following components:   RBC 4.01 (*)    HCT 38.9 (*)  MCH 34.9 (*)    All other components within normal limits  D-DIMER, QUANTITATIVE - Abnormal; Notable for the following components:   D-Dimer, Quant 0.65 (*)    All other components within normal limits  TROPONIN I (HIGH SENSITIVITY)  TROPONIN I (HIGH SENSITIVITY)     EKG My EKG interpretation: Rate of 124, sinus tachycardia.  Left anterior fascicular block.  Normal intervals.  No acute ST elevations or depressions.   RADIOLOGY Chest x-ray per my interpretation shows no acute pathology   PROCEDURES:  Critical Care performed: No  Procedures   MEDICATIONS ORDERED IN ED: Medications  iohexol (OMNIPAQUE) 350 MG/ML injection 100 mL (has no administration in time range)  nitroGLYCERIN (NITROSTAT) SL tablet 0.4 mg (0.4 mg Sublingual Given 09/04/22 1701)     IMPRESSION / MDM / ASSESSMENT AND PLAN / ED COURSE  I reviewed the triage vital signs and the nursing notes.                               Differential diagnosis includes, but is not limited to, ACS, COVID, flu, pulmonary embolism, pneumonia  Patient's presentation is most consistent with acute presentation with potential threat to life or bodily function.  Patient is a 66 year old male presenting today for chest pain, shortness of breath, nasal congestion.  Vital signs showing tachycardia on arrival.  EKG with no ischemic findings.  Initial troponin 13 which is comparable to prior baselines.  Patient did end up testing positive for COVID.  His D-dimer was borderline when age-adjusted at 0.065.  Given his ongoing tachycardia and presenting symptoms with COVID, CTA chest was ordered to evaluate for PE.  Went to discuss these findings with patient and he was adamant that he wanted to leave after finding out he was COVID-positive.  Patient refused to stay for any further testing.  I did discuss with him the risks of leaving which includes possibility of heart attack, pulmonary embolism, worsening shortness of breath which all eventually could lead to death.  Patient understood this and was still adamant that he wanted to leave.  Patient left AMA at this time.  The patient is on the cardiac monitor to evaluate for evidence of arrhythmia and/or significant heart rate changes. Clinical Course as of 09/04/22 1814  Sun Sep 04, 2022  1731 DG Chest 2 View No acute findings per my interpretation [DW]  1731 Troponin I (High Sensitivity): 13 [DW]  1731 CBC(!) Unremarkable [DW]  1732 Basic metabolic panel(!) Unremarkable [DW]  1758 SARS Coronavirus 2 by RT PCR(!): POSITIVE [DW]  1809 Told patient that he did test positive for COVID.  He suspected this might have been the issue.  Stated that we did want to get a second troponin as well as a CTA of his chest to evaluate for ongoing concerns for ACS versus pulmonary embolism.  Patient was adamant that he did not want to stay in the hospital any longer.  I discussed with him that we have  not fully ruled out him having a heart attack or a blood clot in his lungs and that both of these could cause worsening pain, difficulty breathing, blood pressure issues, and ultimately could lead to death.  Patient was understanding and verbalized this back to me.  He still wished to leave the hospital at this time and refused to have further workup be completed.  Patient signed out AMA. [DW]    Clinical Course User Index [DW] Anner Crete,  Donetta Potts, MD     FINAL CLINICAL IMPRESSION(S) / ED DIAGNOSES   Final diagnoses:  COVID  Chest pain, unspecified type  Tachycardia     Rx / DC Orders   ED Discharge Orders     None        Note:  This document was prepared using Dragon voice recognition software and may include unintentional dictation errors.   Janith Lima, MD 09/04/22 Rickey Primus

## 2022-10-25 DIAGNOSIS — K921 Melena: Secondary | ICD-10-CM | POA: Insufficient documentation

## 2023-03-29 ENCOUNTER — Ambulatory Visit: Admission: EM | Admit: 2023-03-29 | Discharge: 2023-03-29 | Disposition: A | Discharge status: Home / Self Care

## 2023-03-29 DIAGNOSIS — H60391 Other infective otitis externa, right ear: Secondary | ICD-10-CM | POA: Diagnosis not present

## 2023-03-29 DIAGNOSIS — H9201 Otalgia, right ear: Secondary | ICD-10-CM

## 2023-03-29 MED ORDER — OFLOXACIN 0.3 % OT SOLN: 10.0000 [drp] | Freq: Every day | OTIC | 0 refills | Status: AC

## 2023-03-29 MED ORDER — AMOXICILLIN-POT CLAVULANATE 875-125 MG PO TABS: 1.0000 | ORAL_TABLET | Freq: Two times a day (BID) | ORAL | 0 refills | Status: AC

## 2023-03-29 NOTE — ED Provider Notes (Signed)
 [facility]    CSN: [id number] Arrival date & time: 03/29/23  1125      History   Chief Complaint Chief Complaint  Patient presents with   Otalgia   Neck Pain    HPI Jason Wiggins. is a 68 y.o. male presenting for right ear pain for the past 3 days.  Patient reports a lot of pain behind the ear, when touching the tragus and moving the ear.  Reports drainage as well.  Has had some swelling in the lymph nodes on the right side.  Reports reduced hearing is a chronic condition.  Patient has been using over-the-counter eardrops and warm compresses without relief.  Concern for possible infection.  HPI  Past Medical History:  Diagnosis Date   Alcohol abuse    possibly as much as 24 beers per day,in past   Arthritis    COVID-19 virus infection 02/2019   History of arthroscopic surgery of shoulder 2023   left   History of echocardiogram    a. 10/2018 Echo: EF 55-60%, borderline LVH. Diast dysfxn. No significant valvular dzs.   HTN (hypertension)    Myocardial infarction (HCC)    Nonobstructive CAD (coronary artery disease)    a. 10/2016 Cath: LM nl, LAD 90m (FFR nl @ 0.83), LCX nl, RCA nl, EF nl; b. 10/2018 MV: no ischemia/infarct. EF 55-60%. Low risk.   Pneumonia     Patient Active Problem List   Diagnosis Date Noted   Sore throat 11/25/2021   Left-sided chest wall pain 08/31/2021   Elevated liver enzymes 08/31/2021   OSA (obstructive sleep apnea) 01/18/2021   Asthma 01/18/2021   Preop pulmonary/respiratory exam 01/18/2021   Nasal congestion 08/06/2020   Knee pain, acute 03/13/2020   Shoulder pain, acute 03/13/2020   Coronary artery disease 03/13/2020   Hyperglycemia 03/13/2020   Fall 03/13/2020   Post covid-19 condition, unspecified 02/14/2020   Rash 09/12/2019   Generalized body aches 08/08/2019   Decreased breath sounds 06/13/2019   Common cold 12/18/2018   Cough 12/18/2018   Essential hypertension 10/16/2018   Contact dermatitis due to poison ivy  09/18/2018   Chest pain on exertion 09/18/2018   Edema of both lower extremities 08/22/2018   Pleuritic chest pain 08/21/2018   Encounter to establish care 08/21/2018   History of coronary artery disease 08/21/2018   Shortness of breath 08/21/2018   Morbid obesity (HCC) 08/21/2018   Sepsis (HCC) 04/26/2018   Suspected COVID-19 virus infection 04/21/2018   Hypoxia 03/21/2018   Bleach ingestion 08/14/2017   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   Substance induced mood disorder (HCC) 08/14/2017   Alcohol abuse 08/14/2017   Unstable angina (HCC) 10/22/2016   Hyperlipidemia 10/22/2016   Medication management 10/22/2016    Past Surgical History:  Procedure Laterality Date   CARDIAC CATHETERIZATION     CORONARY PRESSURE/FFR STUDY N/A 10/28/2016   Procedure: INTRAVASCULAR PRESSURE WIRE/FFR STUDY;  Surgeon: Marykay Lex, MD;  Location: [facility] INVASIVE CV LAB;  Service: Cardiovascular;  Laterality: N/A;   HERNIA REPAIR     Right inguinal   LEFT HEART CATH AND CORONARY ANGIOGRAPHY N/A 10/28/2016   Procedure: LEFT HEART CATH AND CORONARY ANGIOGRAPHY;  Surgeon: Marykay Lex, MD;  Location: [facility] INVASIVE CV LAB;  Service: Cardiovascular;  Laterality: N/A;   SHOULDER ARTHROSCOPY WITH ROTATOR CUFF REPAIR AND SUBACROMIAL DECOMPRESSION Left 02/22/2021   Procedure: SHOULDER ARTHROSCOPY WITH ROTATOR CUFF REPAIR VERSUS ROTATOR CUFF DEBRIDEMENT AND SUBACROMIAL DECOMPRESSION;  Surgeon:  Jones Broom, MD;  Location: [facility] ORS;  Service: Orthopedics;  Laterality: Left;       Home Medications    Prior to Admission medications   Medication Sig Start Date End Date Taking? Authorizing Provider  amLODipine (NORVASC) 10 MG tablet Take 1 tablet (10 mg total) by mouth daily. 08/12/21 03/29/23 Yes Dunn, Raymon Mutton, PA-C  amoxicillin-clavulanate (AUGMENTIN) 875-125 MG tablet Take 1 tablet by mouth every 12 (twelve) hours for 7 days. 03/29/23 04/05/23 Yes Shirlee Latch, PA-C   aspirin EC 81 MG tablet Take 81 mg by mouth daily. Swallow whole.   Yes [provider]  gabapentin (NEURONTIN) 300 MG capsule Take 300 mg by mouth 3 (three) times daily. 12/14/22  Yes [provider]  ofloxacin (FLOXIN) 0.3 % OTIC solution Place 10 drops into the right ear daily for 7 days. 03/29/23 04/05/23 Yes Shirlee Latch, PA-C  valsartan (DIOVAN) 80 MG tablet Take 80 mg by mouth daily.   Yes [provider]  albuterol (VENTOLIN HFA) 108 (90 Base) MCG/ACT inhaler Inhale 1-2 puffs into the lungs every 6 (six) hours as needed for wheezing or shortness of breath. 11/25/21   Iloabachie, Chioma E, NP  Ipratropium-Albuterol (COMBIVENT) 20-100 MCG/ACT AERS respimat Inhale 2 puffs into the lungs in the morning and at bedtime. 11/25/21   Iloabachie, Chioma E, NP  isosorbide mononitrate (IMDUR) 30 MG 24 hr tablet Take 1 tablet (30 mg total) by mouth daily. 10/24/19 01/29/20  Debbe Odea, MD  pravastatin (PRAVACHOL) 40 MG tablet Take 1 tablet (40 mg total) by mouth every evening. 10/24/19 01/29/20  Debbe Odea, MD    Family History Family History  Problem Relation Age of Onset   Cancer Mother    Cancer Father    Cancer Sister        throat and/or lung   Cancer Maternal Grandmother    Cancer Maternal Grandfather    Other Paternal Grandmother        unknown medical history   Other Paternal Grandfather        unknown medical history    Social History Social History   Tobacco Use   Smoking status: Never    Passive exposure: Current   Smokeless tobacco: Never  Vaping Use   Vaping status: Never Used  Substance Use Topics   Alcohol use: Yes    Alcohol/week: 12.0 standard drinks of alcohol    Types: 12 Cans of beer per week    Comment: 6 pack a day 04/21/21, currently 2-3 beers/day   Drug use: No     Allergies   Patient has no known allergies.   Review of Systems Review of Systems  Constitutional:  Negative for fatigue and fever.  HENT:   Positive for ear discharge, ear pain and hearing loss. Negative for congestion and rhinorrhea.   Musculoskeletal:  Negative for neck pain and neck stiffness.  Hematological:  Positive for adenopathy.     Physical Exam Triage Vital Signs ED Triage Vitals  Encounter Vitals Group     BP 03/29/23 1136 126/77     Systolic BP Percentile --      Diastolic BP Percentile --      Pulse Rate 03/29/23 1136 85     Resp 03/29/23 1136 16     Temp 03/29/23 1136 98.4 F (36.9 C)     Temp Source 03/29/23 1136 Oral     SpO2 03/29/23 1136 97 %     Weight 03/29/23 1135 242 lb (109.8 kg)  Height 03/29/23 1135 5\' 8"  (1.727 m)     Head Circumference --      Peak Flow --      Pain Score 03/29/23 1139 10     Pain Loc --      Pain Education --      Exclude from Growth Chart --    No data found.  Updated Vital Signs BP 126/77 (BP Location: Left Arm)   Pulse 85   Temp 98.4 F (36.9 C) (Oral)   Resp 16   Ht 5\' 8"  (1.727 m)   Wt 242 lb (109.8 kg)   SpO2 97%   BMI 36.80 kg/m      Physical Exam Vitals and nursing note reviewed.  Constitutional:      General: He is not in acute distress.    Appearance: Normal appearance. He is well-developed.  HENT:     Head: Normocephalic and atraumatic.     Right Ear: There is mastoid tenderness (mild). Tympanic membrane is injected.     Ears:     Comments: There is a scabbed lesion of the right EAC with surrounding erythema and tenderness on otoscopic examination.  Scant whitish debris adhered to also of right EAC as well.    Nose: Nose normal.     Mouth/Throat:     Mouth: Mucous membranes are moist.     Pharynx: Oropharynx is clear.  Eyes:     General: No scleral icterus.    Conjunctiva/sclera: Conjunctivae normal.  Cardiovascular:     Rate and Rhythm: Normal rate and regular rhythm.  Pulmonary:     Effort: Pulmonary effort is normal. No respiratory distress.     Breath sounds: Normal breath sounds.  Musculoskeletal:     Cervical back: Neck  supple.  Lymphadenopathy:     Cervical: Cervical adenopathy present.  Skin:    General: Skin is warm and dry.     Capillary Refill: Capillary refill takes less than 2 seconds.  Neurological:     General: No focal deficit present.     Mental Status: He is alert. Mental status is at baseline.     Motor: No weakness.     Gait: Gait normal.  Psychiatric:        Mood and Affect: Mood normal.        Behavior: Behavior normal.      UC Treatments / Results  Labs (all labs ordered are listed, but only abnormal results are displayed) Labs Reviewed - No data to display  EKG   Radiology No results found.  Procedures Procedures (including critical care time)  Medications Ordered in UC Medications - No data to display  Initial Impression / Assessment and Plan / UC Course  I have reviewed the triage vital signs and the nursing notes.  Pertinent labs & imaging results that were available during my care of the patient were reviewed by me and considered in my medical decision making (see chart for details).   68 year old male presents for right ear pain for the past couple of days.  Denies fever.  Has taken Tylenol and used over-the-counter eardrops and warm compresses without relief.  On exam has injection of the right TM, scab lesion of the right EAC with surrounding erythema and tenderness, scant whitish debris adhered to the right EAC.  Consistent with otitis externa and infected lesion/cellulitis of EAC.  Will treat at this time with ofloxacin eardrops and Augmentin.  Advised to continue Tylenol, warm compresses, rest and fluids.  Advised going  to ER if fever, acute worsening of ear pain or worsening pain behind the ear.   Final Clinical Impressions(s) / UC Diagnoses   Final diagnoses:  Otalgia, right ear  Infective otitis externa of right ear     Discharge Instructions      -There is an infection in ear canal.  I sent eardrops and oral antibiotics.  Apply warm compresses  to the ear.  Use a cottonball in the ear when showering so it does not get wet on the inside. - May take Tylenol and use warm compresses to help with pain.  Should be improving in a couple days.  If the pain behind the ear worsens, you develop a fever or any worsening of symptoms please seek immediate reevaluation.    ED Prescriptions     Medication Sig Dispense Auth. Provider   amoxicillin-clavulanate (AUGMENTIN) 875-125 MG tablet Take 1 tablet by mouth every 12 (twelve) hours for 7 days. 14 tablet Eusebio Friendly B, PA-C   ofloxacin (FLOXIN) 0.3 % OTIC solution Place 10 drops into the right ear daily for 7 days. 5 mL Shirlee Latch, PA-C      PDMP not reviewed this encounter.   Shirlee Latch, PA-C 03/29/23 1218

## 2023-03-29 NOTE — Discharge Instructions (Addendum)
-  There is an infection in ear canal.  I sent eardrops and oral antibiotics.  Apply warm compresses to the ear.  Use a cottonball in the ear when showering so it does not get wet on the inside. - May take Tylenol and use warm compresses to help with pain.  Should be improving in a couple days.  If the pain behind the ear worsens, you develop a fever or any worsening of symptoms please seek immediate reevaluation.

## 2023-03-29 NOTE — ED Triage Notes (Signed)
 Pt c/o R ear pain & neck edema x3 days. Has tried OTC ear drops w/o relief.

## 2023-04-13 ENCOUNTER — Ambulatory Visit: Admission: EM | Admit: 2023-04-13 | Discharge: 2023-04-13 | Disposition: A | Discharge status: Home / Self Care

## 2023-04-13 DIAGNOSIS — M1990 Unspecified osteoarthritis, unspecified site: Secondary | ICD-10-CM | POA: Insufficient documentation

## 2023-04-13 DIAGNOSIS — U071 COVID-19: Secondary | ICD-10-CM | POA: Insufficient documentation

## 2023-04-13 DIAGNOSIS — Z9889 Other specified postprocedural states: Secondary | ICD-10-CM | POA: Insufficient documentation

## 2023-04-13 DIAGNOSIS — M79604 Pain in right leg: Secondary | ICD-10-CM | POA: Diagnosis not present

## 2023-04-13 DIAGNOSIS — Z8719 Personal history of other diseases of the digestive system: Secondary | ICD-10-CM | POA: Insufficient documentation

## 2023-04-13 NOTE — Discharge Instructions (Addendum)
 Take tylenol as label directed for pain Please follow up with your orthopedist at [facility] or ER for further evaluation of severe right leg pain

## 2023-04-13 NOTE — ED Provider Notes (Signed)
 [facility]    CSN: [id number] Arrival date & time: 04/13/23  1006      History   Chief Complaint Chief Complaint  Patient presents with   Knee Pain    HPI Jason Wiggins. is a 68 y.o. male.   68 year old male pt, Jason Wiggins., presents to urgent care for evaluation of right knee pain x 2 days. Pt denies injury,fall,or trauma. Pt states he sees [facility] in [place] for right hip pain/surgery.  He states he has not take anything for the pain as" I didn't have anything"  The history is provided by the patient. No language interpreter was used.    Past Medical History:  Diagnosis Date   Alcohol abuse    possibly as much as 24 beers per day,in past   Arthritis    COVID-19 virus infection 02/2019   History of arthroscopic surgery of shoulder 2023   left   History of echocardiogram    a. 10/2018 Echo: EF 55-60%, borderline LVH. Diast dysfxn. No significant valvular dzs.   HTN (hypertension)    Myocardial infarction (HCC)    Nonobstructive CAD (coronary artery disease)    a. 10/2016 Cath: LM nl, LAD 46m (FFR nl @ 0.83), LCX nl, RCA nl, EF nl; b. 10/2018 MV: no ischemia/infarct. EF 55-60%. Low risk.   Pneumonia     Patient Active Problem List   Diagnosis Date Noted   COVID-19 04/13/2023   History of arthroscopic procedure on shoulder 04/13/2023   History of cardiac catheterization 04/13/2023   History of hernia repair 04/13/2023   Osteoarthritis 04/13/2023   Hematochezia 10/25/2022   Inflammation of sacroiliac joint (HCC) 07/28/2022   Lumbar spondylosis 07/28/2022   History of total hip arthroplasty 05/02/2022   Osteonecrosis of hip (HCC) 03/21/2022   Sore throat 11/25/2021   Left-sided chest wall pain 08/31/2021   Elevated liver enzymes 08/31/2021   OSA (obstructive sleep apnea) 01/18/2021   Asthma 01/18/2021   Preop pulmonary/respiratory exam 01/18/2021   Nasal congestion 08/06/2020   Acute leg pain, right 03/13/2020   Shoulder pain, acute  03/13/2020   Coronary artery disease 03/13/2020   Hyperglycemia 03/13/2020   Fall 03/13/2020   Post covid-19 condition, unspecified 02/14/2020   Rash 09/12/2019   Generalized body aches 08/08/2019   Decreased breath sounds 06/13/2019   Common cold 12/18/2018   Cough 12/18/2018   Essential hypertension 10/16/2018   Contact dermatitis due to poison ivy 09/18/2018   Chest pain on exertion 09/18/2018   Edema of both lower extremities 08/22/2018   Pleuritic chest pain 08/21/2018   Encounter to establish care 08/21/2018   History of coronary artery disease 08/21/2018   Shortness of breath 08/21/2018   Morbid obesity (HCC) 08/21/2018   Alcohol use 05/28/2018   Myocardial infarction (HCC) 05/28/2018   Sepsis (HCC) 04/26/2018   Suspected COVID-19 virus infection 04/21/2018   Hypoxia 03/21/2018   Bleach ingestion 08/14/2017   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   Substance induced mood disorder (HCC) 08/14/2017   Alcohol abuse 08/14/2017   Unstable angina (HCC) 10/22/2016   Hyperlipidemia 10/22/2016   Medication management 10/22/2016    Past Surgical History:  Procedure Laterality Date   CARDIAC CATHETERIZATION     CORONARY PRESSURE/FFR STUDY N/A 10/28/2016   Procedure: INTRAVASCULAR PRESSURE WIRE/FFR STUDY;  Surgeon: Marykay Lex, MD;  Location: [facility];  Service: Cardiovascular;  Laterality: N/A;   HERNIA REPAIR     Right  inguinal   LEFT HEART CATH AND CORONARY ANGIOGRAPHY N/A 10/28/2016   Procedure: LEFT HEART CATH AND CORONARY ANGIOGRAPHY;  Surgeon: Marykay Lex, MD;  Location: [facility];  Service: Cardiovascular;  Laterality: N/A;   SHOULDER ARTHROSCOPY WITH ROTATOR CUFF REPAIR AND SUBACROMIAL DECOMPRESSION Left 02/22/2021   Procedure: SHOULDER ARTHROSCOPY WITH ROTATOR CUFF REPAIR VERSUS ROTATOR CUFF DEBRIDEMENT AND SUBACROMIAL DECOMPRESSION;  Surgeon: Jones Broom, MD;  Location: [facility];  Service:  Orthopedics;  Laterality: Left;       Home Medications    Prior to Admission medications   Medication Sig Start Date End Date Taking? Authorizing Provider  albuterol (VENTOLIN HFA) 108 (90 Base) MCG/ACT inhaler Inhale 1-2 puffs into the lungs every 6 (six) hours as needed for wheezing or shortness of breath. 11/25/21  Yes Iloabachie, Chioma E, NP  amLODipine (NORVASC) 10 MG tablet Take 1 tablet (10 mg total) by mouth daily. 08/12/21 04/13/23 Yes Dunn, Raymon Mutton, PA-C  aspirin EC 81 MG tablet Take 81 mg by mouth daily. Swallow whole.   Yes [provider]  atorvastatin (LIPITOR) 40 MG tablet Take 40 mg by mouth daily. 04/05/23  Yes [provider]  Ipratropium-Albuterol (COMBIVENT) 20-100 MCG/ACT AERS respimat Inhale 2 puffs into the lungs in the morning and at bedtime. 11/25/21  Yes Iloabachie, Chioma E, NP  metoprolol succinate (TOPROL-XL) 25 MG 24 hr tablet Take 25 mg by mouth daily. 04/05/23  Yes [provider]  valsartan (DIOVAN) 80 MG tablet Take 80 mg by mouth daily.   Yes [provider]  gabapentin (NEURONTIN) 300 MG capsule Take 300 mg by mouth 3 (three) times daily. 12/14/22   [provider]  isosorbide mononitrate (IMDUR) 30 MG 24 hr tablet Take 1 tablet (30 mg total) by mouth daily. 10/24/19 01/29/20  Debbe Odea, MD  pravastatin (PRAVACHOL) 40 MG tablet Take 1 tablet (40 mg total) by mouth every evening. 10/24/19 01/29/20  Debbe Odea, MD    Family History Family History  Problem Relation Age of Onset   Cancer Mother    Cancer Father    Cancer Sister        throat and/or lung   Cancer Maternal Grandmother    Cancer Maternal Grandfather    Other Paternal Grandmother        unknown medical history   Other Paternal Grandfather        unknown medical history    Social History Social History   Tobacco Use   Smoking status: Never    Passive exposure: Current   Smokeless tobacco: Never  Vaping Use   Vaping status:  Never Used  Substance Use Topics   Alcohol use: Yes    Alcohol/week: 12.0 standard drinks of alcohol    Types: 12 Cans of beer per week    Comment: 6 pack a day 04/21/21, currently 2-3 beers/day   Drug use: No     Allergies   Patient has no known allergies.   Review of Systems Review of Systems  Constitutional:  Negative for fever.  Musculoskeletal:  Positive for arthralgias, gait problem and myalgias.  All other systems reviewed and are negative.    Physical Exam Triage Vital Signs ED Triage Vitals  Encounter Vitals Group     BP      Systolic BP Percentile      Diastolic BP Percentile      Pulse      Resp      Temp      Temp  src      SpO2      Weight      Height      Head Circumference      Peak Flow      Pain Score      Pain Loc      Pain Education      Exclude from Growth Chart    No data found.  Updated Vital Signs BP 129/78 (BP Location: Left Arm)   Pulse 78   Temp (!) 97.4 F (36.3 C) (Oral)   Resp 16   Ht 5\' 8"  (1.727 m)   SpO2 94%   BMI 36.80 kg/m   Visual Acuity Right Eye Distance:   Left Eye Distance:   Bilateral Distance:    Right Eye Near:   Left Eye Near:    Bilateral Near:     Physical Exam Vitals and nursing note reviewed.  Cardiovascular:     Rate and Rhythm: Normal rate.     Pulses:          Dorsalis pedis pulses are 2+ on the right side.  Pulmonary:     Effort: Pulmonary effort is normal.  Musculoskeletal:     Right lower leg: Tenderness and bony tenderness present.     Comments: Patient complaining of posterior right leg pain from thigh to ankle  Neurological:     General: No focal deficit present.     Mental Status: He is alert and oriented to person, place, and time.     GCS: GCS eye subscore is 4. GCS verbal subscore is 5. GCS motor subscore is 6.  Psychiatric:        Attention and Perception: Attention normal.        Mood and Affect: Mood normal.        Speech: Speech normal.        Behavior: Behavior normal.       UC Treatments / Results  Labs (all labs ordered are listed, but only abnormal results are displayed) Labs Reviewed - No data to display  EKG   Radiology No results found.  Procedures Procedures (including critical care time)  Medications Ordered in UC Medications - No data to display  Initial Impression / Assessment and Plan / UC Course  I have reviewed the triage vital signs and the nursing notes.  Pertinent labs & imaging results that were available during my care of the patient were reviewed by me and considered in my medical decision making (see chart for details).  Clinical Course as of 04/13/23 1307  Thu Apr 13, 2023  1029 Pt offered tylenol in office, "that stuff don't work". Pt denies injury or trauma,no indication for imaging without injury.  [JD]    Clinical Course User Index [JD] Shaterra Sanzone, Para March, NP   Discussed exam findings and plan of care with patient, Lars MageAnimator )was present during discussion and exam, patient referred to ER for further evaluation of severe right leg pain without known injury.  Patient verbalized understanding to this provider  Ddx: Acute severe right leg pain, DVT, arthritis Final Clinical Impressions(s) / UC Diagnoses   Final diagnoses:  Acute leg pain, right     Discharge Instructions      Take tylenol as label directed for pain Please follow up with your orthopedist at [facility] or ER for further evaluation of severe right leg pain     ED Prescriptions   None    PDMP not reviewed this encounter.   Kalanie Fewell, Para March,  NP 04/13/23 1307

## 2023-04-13 NOTE — ED Triage Notes (Signed)
 Pt c/o R knee pain x2 days. States he woke up w/pain. Denies any falls or injuries. Has knee brace on w/o relief.

## 2023-05-25 ENCOUNTER — Ambulatory Visit: Admission: EM | Admit: 2023-05-25 | Discharge: 2023-05-25 | Discharge status: Against Medical Advice
